# Patient Record
Sex: Female | Born: 1940 | Race: Black or African American | Hispanic: No | Marital: Married | Smoking: Never smoker
Health system: Southern US, Community
[De-identification: ages and names within clinical notes are randomized; demographics above are authoritative.]

## PROBLEM LIST (undated history)

## (undated) DIAGNOSIS — M549 Dorsalgia, unspecified: Secondary | ICD-10-CM

## (undated) DIAGNOSIS — M722 Plantar fascial fibromatosis: Secondary | ICD-10-CM

## (undated) DIAGNOSIS — I10 Essential (primary) hypertension: Secondary | ICD-10-CM

## (undated) DIAGNOSIS — H35039 Hypertensive retinopathy, unspecified eye: Secondary | ICD-10-CM

## (undated) DIAGNOSIS — I739 Peripheral vascular disease, unspecified: Secondary | ICD-10-CM

## (undated) DIAGNOSIS — I499 Cardiac arrhythmia, unspecified: Secondary | ICD-10-CM

## (undated) DIAGNOSIS — E119 Type 2 diabetes mellitus without complications: Secondary | ICD-10-CM

## (undated) DIAGNOSIS — Z86010 Personal history of colonic polyps: Secondary | ICD-10-CM

## (undated) DIAGNOSIS — H269 Unspecified cataract: Secondary | ICD-10-CM

## (undated) DIAGNOSIS — M1990 Unspecified osteoarthritis, unspecified site: Secondary | ICD-10-CM

## (undated) DIAGNOSIS — L259 Unspecified contact dermatitis, unspecified cause: Secondary | ICD-10-CM

## (undated) DIAGNOSIS — R0602 Shortness of breath: Secondary | ICD-10-CM

## (undated) DIAGNOSIS — Z8709 Personal history of other diseases of the respiratory system: Secondary | ICD-10-CM

## (undated) DIAGNOSIS — Z8679 Personal history of other diseases of the circulatory system: Secondary | ICD-10-CM

## (undated) DIAGNOSIS — G8929 Other chronic pain: Secondary | ICD-10-CM

## (undated) DIAGNOSIS — H409 Unspecified glaucoma: Secondary | ICD-10-CM

## (undated) HISTORY — DX: Unspecified contact dermatitis, unspecified cause: L25.9

## (undated) HISTORY — PX: FLEXIBLE SIGMOIDOSCOPY: SHX1649

## (undated) HISTORY — PX: ABDOMINAL HYSTERECTOMY: SHX81

## (undated) HISTORY — DX: Other chronic pain: G89.29

## (undated) HISTORY — PX: OTHER SURGICAL HISTORY: SHX169

## (undated) HISTORY — DX: Personal history of other diseases of the respiratory system: Z87.09

## (undated) HISTORY — DX: Personal history of other diseases of the circulatory system: Z86.79

## (undated) HISTORY — DX: Personal history of colonic polyps: Z86.010

## (undated) HISTORY — DX: Dorsalgia, unspecified: M54.9

## (undated) HISTORY — PX: FOOT SURGERY: SHX648

## (undated) HISTORY — PX: CATARACT EXTRACTION: SUR2

## (undated) HISTORY — DX: Unspecified cataract: H26.9

## (undated) HISTORY — DX: Unspecified glaucoma: H40.9

## (undated) HISTORY — DX: Plantar fascial fibromatosis: M72.2

## (undated) HISTORY — DX: Type 2 diabetes mellitus without complications: E11.9

## (undated) HISTORY — DX: Essential (primary) hypertension: I10

## (undated) HISTORY — DX: Hypertensive retinopathy, unspecified eye: H35.039

---

## 1999-05-27 ENCOUNTER — Encounter: Payer: Self-pay | Admitting: Internal Medicine

## 1999-05-27 ENCOUNTER — Ambulatory Visit (HOSPITAL_COMMUNITY): Admission: RE | Admit: 1999-05-27 | Discharge: 1999-05-27 | Payer: Self-pay | Admitting: Internal Medicine

## 1999-11-16 ENCOUNTER — Emergency Department (HOSPITAL_COMMUNITY): Admission: EM | Admit: 1999-11-16 | Discharge: 1999-11-16 | Payer: Self-pay | Admitting: Emergency Medicine

## 1999-11-17 ENCOUNTER — Encounter: Payer: Self-pay | Admitting: Emergency Medicine

## 2001-03-11 ENCOUNTER — Other Ambulatory Visit: Admission: RE | Admit: 2001-03-11 | Discharge: 2001-03-11 | Payer: Self-pay | Admitting: Obstetrics & Gynecology

## 2003-03-21 ENCOUNTER — Other Ambulatory Visit: Admission: RE | Admit: 2003-03-21 | Discharge: 2003-03-21 | Payer: Self-pay | Admitting: Obstetrics & Gynecology

## 2004-07-04 ENCOUNTER — Encounter: Admission: RE | Admit: 2004-07-04 | Discharge: 2004-07-18 | Payer: Self-pay | Admitting: Internal Medicine

## 2005-02-03 ENCOUNTER — Ambulatory Visit: Payer: Self-pay | Admitting: Internal Medicine

## 2005-03-18 ENCOUNTER — Ambulatory Visit: Payer: Self-pay | Admitting: Endocrinology

## 2005-03-18 ENCOUNTER — Other Ambulatory Visit: Admission: RE | Admit: 2005-03-18 | Discharge: 2005-03-18 | Payer: Self-pay | Admitting: Obstetrics & Gynecology

## 2005-03-28 ENCOUNTER — Ambulatory Visit: Payer: Self-pay | Admitting: Internal Medicine

## 2005-04-04 ENCOUNTER — Ambulatory Visit: Payer: Self-pay | Admitting: Internal Medicine

## 2006-06-12 ENCOUNTER — Ambulatory Visit: Payer: Self-pay | Admitting: Internal Medicine

## 2006-09-11 ENCOUNTER — Ambulatory Visit: Payer: Self-pay | Admitting: Internal Medicine

## 2007-01-18 ENCOUNTER — Ambulatory Visit: Payer: Self-pay | Admitting: Internal Medicine

## 2007-02-02 ENCOUNTER — Ambulatory Visit: Payer: Self-pay | Admitting: Internal Medicine

## 2007-05-12 LAB — CONVERTED CEMR LAB: Pap Smear: NORMAL

## 2007-06-23 ENCOUNTER — Ambulatory Visit: Payer: Self-pay | Admitting: Internal Medicine

## 2007-06-23 LAB — CONVERTED CEMR LAB
BUN: 17 mg/dL (ref 6–23)
CO2: 27 meq/L (ref 19–32)
Calcium: 9.5 mg/dL (ref 8.4–10.5)
Chloride: 104 meq/L (ref 96–112)
Creatinine, Ser: 0.9 mg/dL (ref 0.4–1.2)
Folate: 15.4 ng/mL
GFR calc Af Amer: 81 mL/min
GFR calc non Af Amer: 67 mL/min
Glucose, Bld: 102 mg/dL — ABNORMAL HIGH (ref 70–99)
Hgb A1c MFr Bld: 6.2 % — ABNORMAL HIGH (ref 4.6–6.0)
Potassium: 4.2 meq/L (ref 3.5–5.1)
Sodium: 141 meq/L (ref 135–145)
TSH: 1.18 microintl units/mL (ref 0.35–5.50)
Vitamin B-12: 876 pg/mL (ref 211–911)

## 2007-07-20 ENCOUNTER — Ambulatory Visit: Payer: Self-pay | Admitting: Endocrinology

## 2007-07-29 ENCOUNTER — Ambulatory Visit: Payer: Self-pay

## 2007-08-20 ENCOUNTER — Encounter: Payer: Self-pay | Admitting: Internal Medicine

## 2007-08-20 ENCOUNTER — Ambulatory Visit: Payer: Self-pay | Admitting: Internal Medicine

## 2007-08-20 DIAGNOSIS — J309 Allergic rhinitis, unspecified: Secondary | ICD-10-CM | POA: Insufficient documentation

## 2007-08-20 DIAGNOSIS — I10 Essential (primary) hypertension: Secondary | ICD-10-CM | POA: Insufficient documentation

## 2007-10-08 ENCOUNTER — Encounter: Payer: Self-pay | Admitting: Internal Medicine

## 2007-10-08 ENCOUNTER — Telehealth: Payer: Self-pay | Admitting: Internal Medicine

## 2008-03-22 ENCOUNTER — Encounter: Payer: Self-pay | Admitting: *Deleted

## 2008-03-22 DIAGNOSIS — M722 Plantar fascial fibromatosis: Secondary | ICD-10-CM | POA: Insufficient documentation

## 2008-03-22 DIAGNOSIS — J329 Chronic sinusitis, unspecified: Secondary | ICD-10-CM | POA: Insufficient documentation

## 2008-03-22 DIAGNOSIS — M549 Dorsalgia, unspecified: Secondary | ICD-10-CM | POA: Insufficient documentation

## 2008-03-22 DIAGNOSIS — Z8679 Personal history of other diseases of the circulatory system: Secondary | ICD-10-CM | POA: Insufficient documentation

## 2008-03-22 DIAGNOSIS — L259 Unspecified contact dermatitis, unspecified cause: Secondary | ICD-10-CM | POA: Insufficient documentation

## 2008-03-23 ENCOUNTER — Ambulatory Visit: Payer: Self-pay | Admitting: Internal Medicine

## 2008-03-23 DIAGNOSIS — J069 Acute upper respiratory infection, unspecified: Secondary | ICD-10-CM | POA: Insufficient documentation

## 2008-04-25 ENCOUNTER — Ambulatory Visit: Payer: Self-pay | Admitting: Internal Medicine

## 2008-05-02 ENCOUNTER — Telehealth: Payer: Self-pay | Admitting: Internal Medicine

## 2008-05-10 ENCOUNTER — Telehealth: Payer: Self-pay | Admitting: Internal Medicine

## 2008-05-17 ENCOUNTER — Ambulatory Visit: Payer: Self-pay | Admitting: Internal Medicine

## 2008-08-11 ENCOUNTER — Encounter: Payer: Self-pay | Admitting: Internal Medicine

## 2008-09-01 ENCOUNTER — Ambulatory Visit: Payer: Self-pay | Admitting: Internal Medicine

## 2008-09-01 DIAGNOSIS — K5732 Diverticulitis of large intestine without perforation or abscess without bleeding: Secondary | ICD-10-CM | POA: Insufficient documentation

## 2008-12-06 ENCOUNTER — Telehealth: Payer: Self-pay | Admitting: Internal Medicine

## 2008-12-26 ENCOUNTER — Ambulatory Visit: Payer: Self-pay | Admitting: Internal Medicine

## 2008-12-28 ENCOUNTER — Ambulatory Visit: Payer: Self-pay | Admitting: Internal Medicine

## 2008-12-28 LAB — CONVERTED CEMR LAB
BUN: 24 mg/dL — ABNORMAL HIGH (ref 6–23)
CO2: 28 meq/L (ref 19–32)
Calcium: 9.3 mg/dL (ref 8.4–10.5)
Chloride: 103 meq/L (ref 96–112)
Creatinine, Ser: 1.1 mg/dL (ref 0.4–1.2)
GFR calc Af Amer: 64 mL/min
GFR calc non Af Amer: 53 mL/min
Glucose, Bld: 124 mg/dL — ABNORMAL HIGH (ref 70–99)
Potassium: 3.9 meq/L (ref 3.5–5.1)
Sodium: 140 meq/L (ref 135–145)

## 2009-01-04 ENCOUNTER — Telehealth: Payer: Self-pay | Admitting: Internal Medicine

## 2009-01-09 ENCOUNTER — Encounter: Admission: RE | Admit: 2009-01-09 | Discharge: 2009-01-09 | Payer: Self-pay | Admitting: Internal Medicine

## 2009-01-25 ENCOUNTER — Telehealth: Payer: Self-pay | Admitting: Internal Medicine

## 2009-03-05 ENCOUNTER — Ambulatory Visit: Payer: Self-pay | Admitting: Internal Medicine

## 2009-04-06 ENCOUNTER — Telehealth: Payer: Self-pay | Admitting: Internal Medicine

## 2009-05-21 ENCOUNTER — Telehealth: Payer: Self-pay | Admitting: Internal Medicine

## 2009-12-10 ENCOUNTER — Telehealth: Payer: Self-pay | Admitting: Internal Medicine

## 2010-06-24 ENCOUNTER — Telehealth: Payer: Self-pay | Admitting: Internal Medicine

## 2010-12-09 ENCOUNTER — Telehealth: Payer: Self-pay | Admitting: Internal Medicine

## 2010-12-31 NOTE — Progress Notes (Signed)
Summary: Multiple med refill request  Phone Note Call from Patient   Summary of Call: Patient is requesting a call, wants a med called in.  Initial call taken by: Lamar Sprinkles, CMA,  December 10, 2009 9:44 AM  Follow-up for Phone Call        Spoke with patient and she needs refills on:  Tiazac, Zolpidem, Metoprolol, Bumetanide, and Estropipate. Follow-up by: Lucious Groves,  December 10, 2009 10:49 AM  Additional Follow-up for Phone Call Additional follow up Details #1::        OK for 6 month refills on all meds. Additional Follow-up by: Jacques Navy MD,  December 10, 2009 2:31 PM    Additional Follow-up for Phone Call Additional follow up Details #2::    done. Patient was notified and she prefers the prescription's go to [facility] not [facility] will change prescriptions accordingly/print zolpidem. Follow-up by: Lucious Groves,  December 10, 2009 4:18 PM  Prescriptions: ZOLPIDEM TARTRATE 10 MG  TABS (ZOLPIDEM TARTRATE) 1 by mouth at bedtime  #90 x 2   Entered by:   Lucious Groves   Authorized by:   Jacques Navy MD   Signed by:   Lucious Groves on 12/10/2009   Method used:   Printed then faxed to ...       [facility]* (mail-order)             ,          Ph: [phone]       Fax: [phone]   RxID:   [id number] METOPROLOL SUCCINATE 50 MG  TB24 (METOPROLOL SUCCINATE) Take 1 tablet by mouth once a day  #90 x 2   Entered by:   Lucious Groves   Authorized by:   Jacques Navy MD   Signed by:   Lucious Groves on 12/10/2009   Method used:   Electronically to        [facility]* (mail-order)             ,          Ph: [phone]       Fax: [phone]   RxID:   [id number] BUMETANIDE 1 MG  TABS (BUMETANIDE) once daily  #90 x 2   Entered by:   Lucious Groves   Authorized by:   Jacques Navy MD   Signed by:   Lucious Groves on 12/10/2009   Method used:   Electronically to        [facility]* (mail-order)             ,          Ph: [phone]       Fax: [phone]  RxID:   [id number] ESTROPIPATE 1.5 MG  TABS (ESTROPIPATE) once daily  #90 x 2   Entered by:   Lucious Groves   Authorized by:   Jacques Navy MD   Signed by:   Lucious Groves on 12/10/2009   Method used:   Electronically to        [facility]* (mail-order)             ,          Ph: [phone]       Fax: [phone]   RxID:   [id number] TIAZAC 300 MG  CP24 (DILTIAZEM HCL ER BEADS) once daily  #90 Capsule x 2   Entered by:   Lucious Groves   Authorized by:   Jacques Navy MD  Signed by:   Lucious Groves on 12/10/2009   Method used:   Electronically to        [facility] [employer] [facility]* (mail-order)             ,          Ph: [phone]       Fax: [phone]   RxID:   [id number] ZOLPIDEM TARTRATE 10 MG  TABS (ZOLPIDEM TARTRATE) 1 by mouth at bedtime  #90 x 2   Entered by:   Lucious Groves   Authorized by:   Jacques Navy MD   Signed by:   Lucious Groves on 12/10/2009   Method used:   [facility] to ...       [facility] [address] [phone]* (retail)       [address]       [place], [place]  [address]       Ph: [phone] or [phone]       Fax: [phone]   RxID:   [id number] METOPROLOL SUCCINATE 50 MG  TB24 (METOPROLOL SUCCINATE) Take 1 tablet by mouth once a day  #90 x 2   Entered by:   Lucious Groves   Authorized by:   Jacques Navy MD   Signed by:   Lucious Groves on 12/10/2009   Method used:   [facility]        [facility] [address] [phone]* (retail)       [address]       [place], [place]  [address]       Ph: [phone] or [phone]       Fax: [phone]   RxID:   [id number] BUMETANIDE 1 MG  TABS (BUMETANIDE) once daily  #90 x 2   Entered by:   Lucious Groves   Authorized by:   Jacques Navy MD   Signed by:   Lucious Groves on 12/10/2009   Method used:   [facility]ronically to        [facility] [address] [phone]* (retail)       [address]       [place], [place]   [address]       Ph: [phone] or [phone]       Fax: [phone]   RxID:   [id number] ESTROPIPATE 1.5 MG  TABS (ESTROPIPATE) once daily  #90 x 2   Entered by:   Lucious Groves   Authorized by:   Jacques Navy MD   Signed by:   Lucious Groves on 12/10/2009   Method used:   Electronically to        [facility] [address] [phone]* (retail)       [address]       [place], [place]  [address]       Ph: [phone] or [phone]       Fax: [phone]   RxID:   [id number] TIAZAC 300 MG  CP24 (DILTIAZEM HCL ER BEADS) once daily  #90 Capsule x 2   Entered by:   Lucious Groves   Authorized by:   Jacques Navy MD   Signed by:   Lucious Groves on 12/10/2009   Method used:   Electronically to        [facility] [address] [phone]* (retail)  [address]       [place], [place]  [address]       Ph: [phone] or [phone]       Fax: [phone]   RxID:   [id number]   Appended Document: Multiple med refill request Faxed Zolpidem prescription to [facility].

## 2010-12-31 NOTE — Progress Notes (Signed)
Summary: [facility]  Phone Note Call from Patient Call back at [facility]  Phone [phone]   Summary of Call: Patient left message on triage that she would like her prescriptions, including Zolpidem, sent to [facility]. I am aware the pain med cannot be sent, but is it ok to send the rest?  Please advise. Initial call taken by: Lucious Groves CMA,  June 24, 2010 10:00 AM  Follow-up for Phone Call        OK for refills. Can print the hydrocodone Rx and fax it to [facility] Follow-up by: Jacques Navy MD,  June 24, 2010 10:46 AM    Prescriptions: HYDROCODONE-ACETAMINOPHEN 7.5-750 MG TABS (HYDROCODONE-ACETAMINOPHEN) 1 by mouth three times a day as needed  #90 x 3   Entered by:   Lucious Groves CMA   Authorized by:   Jacques Navy MD   Signed by:   Lucious Groves CMA on 06/24/2010   Method used:   Printed then faxed to ...       [facility] (mail-order)             , [place]         Ph: [phone]       Fax: [phone]   RxID:   [id number] ZOLPIDEM TARTRATE 10 MG  TABS (ZOLPIDEM TARTRATE) 1 by mouth at bedtime  #90 x 3   Entered by:   Lucious Groves CMA   Authorized by:   Jacques Navy MD   Signed by:   Lucious Groves CMA on 06/24/2010   Method used:   Printed then faxed to ...       [facility] (mail-order)             , [place]         Ph: [phone]       Fax: [phone]   RxID:   [id number] DIOVAN 160 MG TABS (VALSARTAN) 1 by mouth once daily  #90 x 3   Entered by:   Lucious Groves CMA   Authorized by:   Jacques Navy MD   Signed by:   Lucious Groves CMA on 06/24/2010   Method used:   Faxed to ...       [facility] (mail-order)             , [place]         Ph: [phone]       Fax: [phone]   RxID:   [id number] METOPROLOL SUCCINATE 50 MG  TB24 (METOPROLOL SUCCINATE) Take 1 tablet by mouth once a day  #90 x 3   Entered by:   Lucious Groves CMA   Authorized by:   Jacques Navy MD   Signed by:   Lucious Groves CMA on 06/24/2010   Method used:   Faxed to ...       [facility] (mail-order)          , [place]         Ph: [phone]       Fax: [phone]   RxID:   [id number] BUMETANIDE 1 MG  TABS (BUMETANIDE) once daily  #90 x 3   Entered by:   Lucious Groves CMA   Authorized by:   Jacques Navy MD   Signed by:   Lucious Groves CMA on 06/24/2010   Method used:   Faxed to ...       [facility] (mail-order)             , [place]  Ph: [phone]       Fax: [phone]   RxID:   [id number] ESTROPIPATE 1.5 MG  TABS (ESTROPIPATE) once daily  #90 x 3   Entered by:   Lucious Groves CMA   Authorized by:   Jacques Navy MD   Signed by:   Lucious Groves CMA on 06/24/2010   Method used:   Faxed to ...       [facility] (mail-order)             , [place]         Ph: [phone]       Fax: [phone]   RxID:   [id number] TIAZAC 300 MG  CP24 (DILTIAZEM HCL ER BEADS) once daily  #90 Capsule x 3   Entered by:   Lucious Groves CMA   Authorized by:   Jacques Navy MD   Signed by:   Lucious Groves CMA on 06/24/2010   Method used:   Faxed to ...       [facility] (mail-order)             , [place]         Ph: [phone]       Fax: [phone]   RxID:   [id number]

## 2011-01-02 NOTE — Progress Notes (Signed)
Summary: [facility]- [facility]  Phone Note Refill Request Call back at Home Phone [phone]   Refills Requested: Medication #1:  TIAZAC 300 MG  CP24 once daily  Medication #2:  BUMETANIDE 1 MG  TABS once daily  Medication #3:  ZOLPIDEM TARTRATE 10 MG  TABS 1 by mouth at bedtime Initial call taken by: Lamar Sprinkles, CMA,  December 10, 2010 10:01 AM Caller: Patient--[phone] Call For: Jacques Navy MD Summary of Call: Pt left message on triage A, pt wants refills to go to [facility]. Pt wants a call as well. Initial call taken by: Verdell Face,  December 09, 2010 9:48 AM  Follow-up for Phone Call        OK FOR RFS TO GO TO [facility]?  Follow-up by: Lamar Sprinkles, CMA,  December 10, 2010 10:01 AM  Additional Follow-up for Phone Call Additional follow up Details #1::        ok for meds to go to [facility] for 90 day supply Additional Follow-up by: Jacques Navy MD,  December 10, 2010 5:58 PM    Additional Follow-up for Phone Call Additional follow up Details #2::    Rx ready - to be faxed from office Follow-up by: Lamar Sprinkles, CMA,  December 11, 2010 5:36 PM  Prescriptions: ZOLPIDEM TARTRATE 10 MG  TABS (ZOLPIDEM TARTRATE) 1 by mouth at bedtime  #90 x 1   Entered by:   Lamar Sprinkles, CMA   Authorized by:   Jacques Navy MD   Signed by:   Lamar Sprinkles, CMA on 12/11/2010   Method used:   Printed then faxed to ...       [facility]* (retail)             ,          Ph: [phone]       Fax: [phone]   RxID:   [id number] BUMETANIDE 1 MG  TABS (BUMETANIDE) once daily  #90 x 3   Entered by:   Lamar Sprinkles, CMA   Authorized by:   Jacques Navy MD   Signed by:   Lamar Sprinkles, CMA on 12/11/2010   Method used:   Electronically to        [facility]* (retail)             ,          Ph: [phone]       Fax: [phone]   RxID:   [id number] TIAZAC 300 MG  CP24 (DILTIAZEM HCL ER BEADS) once daily  #90 Capsule x 3   Entered by:   Lamar Sprinkles, CMA   Authorized by:    Jacques Navy MD   Signed by:   Lamar Sprinkles, CMA on 12/11/2010   Method used:   Electronically to        [facility]* (retail)             ,          Ph: [phone]       Fax: [phone]   RxID:   [id number]

## 2011-04-18 NOTE — Assessment & Plan Note (Signed)
[facility]                           PRIMARY CARE OFFICE NOTE   Sarah [name], Sarah [name]                        MRN:          [id number]  DATE:02/02/2007                            DOB:          [birth year]    HISTORY OF PRESENT ILLNESS:  Sarah [name] presents for follow up of her  blood pressure.  She was last seen in the office on January 18, 2007  with a blood pressure of 159/87.  Lasix 20 mg q.a.m. was added to her  regimen of Tiazac 300 mg daily and Toprol XL 50 mg daily.  On this  regimen, the patient's blood pressure today was 149/72.  She reports she  is actually feeling better.  However, she reports she is having some  increasing pruritus which she thinks is due to the furosemide.   The patient also has ongoing problems with chronic back pain and has  been out of Celebrex.   PLAN:  The patient is switched from Lasix to Bumex (bumetanide) 1 mg  daily, prescription provided.   The patient was given a prescription for Celebrex 200 mg daily for her  back pain.     Rosalyn Gess Norins, MD  Electronically Signed    [facility]  DD: 02/02/2007  DT: 02/02/2007  Job #: [id number]

## 2011-08-27 ENCOUNTER — Other Ambulatory Visit: Payer: Self-pay | Admitting: Internal Medicine

## 2011-09-04 ENCOUNTER — Encounter: Payer: Self-pay | Admitting: Internal Medicine

## 2011-09-08 ENCOUNTER — Ambulatory Visit (INDEPENDENT_AMBULATORY_CARE_PROVIDER_SITE_OTHER): Payer: Medicare Other | Admitting: Internal Medicine

## 2011-09-08 VITALS — BP 180/60 | HR 92 | Temp 98.5°F | Wt 160.0 lb

## 2011-09-08 DIAGNOSIS — L259 Unspecified contact dermatitis, unspecified cause: Secondary | ICD-10-CM

## 2011-09-08 DIAGNOSIS — I10 Essential (primary) hypertension: Secondary | ICD-10-CM

## 2011-09-08 DIAGNOSIS — M549 Dorsalgia, unspecified: Secondary | ICD-10-CM

## 2011-09-08 DIAGNOSIS — J309 Allergic rhinitis, unspecified: Secondary | ICD-10-CM

## 2011-09-08 DIAGNOSIS — Z0000 Encounter for general adult medical examination without abnormal findings: Secondary | ICD-10-CM

## 2011-09-08 DIAGNOSIS — Z23 Encounter for immunization: Secondary | ICD-10-CM

## 2011-09-08 MED ORDER — METOPROLOL TARTRATE 50 MG PO TABS: 50.0000 mg | ORAL_TABLET | Freq: Every day | ORAL | Status: DC

## 2011-09-08 MED ORDER — HYDROCODONE-ACETAMINOPHEN 7.5-750 MG PO TABS: 1.0000 | ORAL_TABLET | Freq: Four times a day (QID) | ORAL | Status: DC | PRN

## 2011-09-08 MED ORDER — ZOLPIDEM TARTRATE 10 MG PO TABS: 10.0000 mg | ORAL_TABLET | Freq: Every evening | ORAL | Status: DC | PRN

## 2011-09-08 NOTE — Patient Instructions (Signed)
Allergic rhinits - drainage without signs of infection. Plan - try generic claritin 10mg  once a day. Safe with BP  Rash on  Breast does not look like shingles. It may be an insect bite or local reaction. Watch for blistering or spreading - if this happens call. Try over the counter cortisone cream to the rash twice a day.  Blood pressure - 180/60 too high. Plan - please check a few out of the office readings and report back. If the top number is running greater than 140 on a regular basis will need to make increases in the medications you already take.   Health maintenance - up to date with Dr. Jennette Kettle, up to date with mammography.

## 2011-09-08 NOTE — Progress Notes (Signed)
  Subjective:    Patient ID: Sarah [name], female    DOB: 23-Oct-1941, 70 y.o.   MRN: [id number]  HPI Ms. Conaty presents for post-nasal drainage which is a chronic problem. She has had no fever, chills, purulent drainage or sinus pain.  She c/o itchy red rash on the right breast that has been present for several days. There is no spread, no pain or paresthesia.  She needs refills on several medications.  Her BP has been elevated.  She is current with Dr. Jennette Kettle for Gyn care. She was Vitamin D deficient and had replacement therapy with follow up Vit D level of 62. She is current with mammography   Past Medical History  Diagnosis Date  . Plantar fascial fibromatosis   . Personal history of unspecified circulatory disease   . Contact dermatitis and other eczema, due to unspecified cause   . History of sinusitis   . Hypertension   . Chronic back pain   . Hypertension   . Allergic rhinitis    Past Surgical History  Procedure Date  . History of lumbar fusion     '87   No family history on file. History   Social History  . Marital Status: Married    Spouse Name: N/A    Number of Children: N/A  . Years of Education: N/A   Occupational History  . Not on file.   Social History Main Topics  . Smoking status: Never Smoker   . Smokeless tobacco: Never Used  . Alcohol Use: No  . Drug Use: No  . Sexually Active: Not on file   Other Topics Concern  . Not on file   Social History Narrative   HSG. Married- '63. 1 daughter '64, 1 son '70,  2 grandchildren. Work: retired. Lives with husband and brother is in the home.        Review of Systems System review is negative for any constitutional, cardiac, pulmonary, GI or neuro symptoms or complaints other than as described in the HPI.     Objective:   Physical Exam WNWD AA woman in no distress HEENT- no facial tenderness, C&S clear, minimal proptosis Neck - supple Cor - 2+ radial and DP pulse, RRR w/o murmur, rub, gallop, no  peripheral edema Chest - clear Derm - 3 erythematous punctate lesion right breast with no vesicle no underlying erythema  Lab Results  Component Value Date   WBC 4.4* 09/09/2011   HGB 13.1 09/09/2011   HCT 40.0 09/09/2011   PLT 230.0 09/09/2011   GLUCOSE 105* 09/09/2011   CHOL 161 09/09/2011   TRIG 57.0 09/09/2011   HDL 56.40 09/09/2011   LDLCALC 93 09/09/2011   ALT 28 09/09/2011   AST 23 09/09/2011   NA 142 09/09/2011   K 4.2 09/09/2011   CL 106 09/09/2011   CREATININE 1.2 09/09/2011   BUN 24* 09/09/2011   CO2 27 09/09/2011   TSH 1.77 09/09/2011   HGBA1C 6.5 09/09/2011          Assessment & Plan:

## 2011-09-09 ENCOUNTER — Other Ambulatory Visit (INDEPENDENT_AMBULATORY_CARE_PROVIDER_SITE_OTHER): Payer: Medicare Other

## 2011-09-09 ENCOUNTER — Other Ambulatory Visit: Payer: Self-pay | Admitting: *Deleted

## 2011-09-09 DIAGNOSIS — I10 Essential (primary) hypertension: Secondary | ICD-10-CM

## 2011-09-09 DIAGNOSIS — Z0000 Encounter for general adult medical examination without abnormal findings: Secondary | ICD-10-CM

## 2011-09-09 DIAGNOSIS — Z79899 Other long term (current) drug therapy: Secondary | ICD-10-CM

## 2011-09-09 LAB — CBC WITH DIFFERENTIAL/PLATELET
Basophils Relative: 0.3 % (ref 0.0–3.0)
Eosinophils Relative: 3 % (ref 0.0–5.0)
HCT: 40 % (ref 36.0–46.0)
Hemoglobin: 13.1 g/dL (ref 12.0–15.0)
Lymphocytes Relative: 34.5 % (ref 12.0–46.0)
Lymphs Abs: 1.5 10*3/uL (ref 0.7–4.0)
Monocytes Relative: 12.5 % — ABNORMAL HIGH (ref 3.0–12.0)
Neutro Abs: 2.2 10*3/uL (ref 1.4–7.7)
RBC: 4.41 Mil/uL (ref 3.87–5.11)

## 2011-09-09 LAB — COMPREHENSIVE METABOLIC PANEL
ALT: 28 U/L (ref 0–35)
AST: 23 U/L (ref 0–37)
Alkaline Phosphatase: 87 U/L (ref 39–117)
Glucose, Bld: 105 mg/dL — ABNORMAL HIGH (ref 70–99)
Sodium: 142 mEq/L (ref 135–145)
Total Bilirubin: 0.7 mg/dL (ref 0.3–1.2)
Total Protein: 8 g/dL (ref 6.0–8.3)

## 2011-09-09 LAB — LIPID PANEL
Cholesterol: 161 mg/dL (ref 0–200)
HDL: 56.4 mg/dL
LDL Cholesterol: 93 mg/dL (ref 0–99)
Total CHOL/HDL Ratio: 3
Triglycerides: 57 mg/dL (ref 0.0–149.0)
VLDL: 11.4 mg/dL (ref 0.0–40.0)

## 2011-09-09 LAB — TSH: TSH: 1.77 u[IU]/mL (ref 0.35–5.50)

## 2011-09-09 MED ORDER — VALSARTAN 160 MG PO TABS: 160.0000 mg | ORAL_TABLET | Freq: Every day | ORAL | Status: DC

## 2011-09-10 ENCOUNTER — Encounter: Payer: Self-pay | Admitting: Internal Medicine

## 2011-09-10 DIAGNOSIS — Z0000 Encounter for general adult medical examination without abnormal findings: Secondary | ICD-10-CM | POA: Insufficient documentation

## 2011-09-10 NOTE — Assessment & Plan Note (Addendum)
Interval history w/o serious illness, w/o surgery or injury. She is current with gyn, breast cancer screening w/ mammography. She appears to be due for colonoscopy for routine cancer screening. Immunizations as noted. Lab results are in normal limits including A1C, lipids and general chemistry.  In summary - a nice woman who hasn't been seen for 2 years but who is medically stable except for elevated BP with plans as above. She is encouraged to schedule colonoscopy. She will return in 6 months or prn.

## 2011-09-10 NOTE — Assessment & Plan Note (Signed)
Chronic problem that is stable w/o exacerbation. Since she has retired she has had less discomfort.

## 2011-09-10 NOTE — Assessment & Plan Note (Signed)
There is a minimal rash on the right breast. This does not appear to be HSV or zoster.  Plan - topical low dose cortisone.

## 2011-09-10 NOTE — Assessment & Plan Note (Signed)
BP Readings from Last 3 Encounters:  09/08/11 180/60  03/05/09 158/82  12/26/08 150/82   Poor control with an upward trend. She has no symptoms or complaints.  Plan - home monitoring and report back. If SBP running greater than 140 will adjust her current medications - there is room to increase doses of several of her medications.

## 2011-09-10 NOTE — Assessment & Plan Note (Signed)
No evidence of infection. Suspect her symptoms are all allergy related. No indication for antibiotics.  Plan - otc non-sedating antihistamine.

## 2011-09-21 ENCOUNTER — Encounter: Payer: Self-pay | Admitting: Internal Medicine

## 2011-10-01 ENCOUNTER — Telehealth: Payer: Self-pay | Admitting: *Deleted

## 2011-10-01 NOTE — Telephone Encounter (Signed)
Pt left Vm w/BP readings - 180/60 and 122/64

## 2011-10-03 MED ORDER — FUROSEMIDE 20 MG PO TABS: 20.0000 mg | ORAL_TABLET | Freq: Every day | ORAL | Status: DC

## 2011-10-03 NOTE — Telephone Encounter (Signed)
Need to add low dose diuretic - furosemide 20 mg qd. Rx sent to [facility]

## 2011-10-03 NOTE — Telephone Encounter (Signed)
Informed pt .

## 2011-12-29 ENCOUNTER — Other Ambulatory Visit: Payer: Self-pay

## 2011-12-29 MED ORDER — METOPROLOL TARTRATE 50 MG PO TABS: 50.0000 mg | ORAL_TABLET | Freq: Every day | ORAL | Status: DC

## 2011-12-29 MED ORDER — FUROSEMIDE 20 MG PO TABS: 20.0000 mg | ORAL_TABLET | Freq: Every day | ORAL | Status: DC

## 2011-12-29 MED ORDER — BUMETANIDE 1 MG PO TABS: 1.0000 mg | ORAL_TABLET | Freq: Every day | ORAL | Status: DC

## 2011-12-29 MED ORDER — ESTROPIPATE 1.5 MG PO TABS: 1.5000 mg | ORAL_TABLET | Freq: Every day | ORAL | Status: DC

## 2011-12-29 MED ORDER — DILTIAZEM HCL ER BEADS 300 MG PO CP24: 300.0000 mg | ORAL_CAPSULE | Freq: Every day | ORAL | Status: DC

## 2011-12-29 MED ORDER — VALSARTAN 160 MG PO TABS: 160.0000 mg | ORAL_TABLET | Freq: Every day | ORAL | Status: DC

## 2012-01-12 ENCOUNTER — Telehealth: Payer: Self-pay

## 2012-01-12 NOTE — Telephone Encounter (Signed)
Received faxed refill request from [facility] requesting zolpidem 10 mg. Please advise if ok to refill Thanks

## 2012-01-12 NOTE — Telephone Encounter (Signed)
Ok for [employer] refill x 5

## 2012-01-13 MED ORDER — ZOLPIDEM TARTRATE 10 MG PO TABS: 10.0000 mg | ORAL_TABLET | Freq: Every evening | ORAL | Status: DC | PRN

## 2012-02-16 ENCOUNTER — Encounter: Payer: Self-pay | Admitting: Internal Medicine

## 2012-02-16 ENCOUNTER — Ambulatory Visit (INDEPENDENT_AMBULATORY_CARE_PROVIDER_SITE_OTHER): Payer: Medicare Other | Admitting: Internal Medicine

## 2012-02-16 VITALS — BP 124/58 | HR 85 | Temp 98.3°F | Resp 16 | Wt 149.0 lb

## 2012-02-16 DIAGNOSIS — I10 Essential (primary) hypertension: Secondary | ICD-10-CM

## 2012-02-16 MED ORDER — LOSARTAN POTASSIUM 100 MG PO TABS: 100.0000 mg | ORAL_TABLET | Freq: Every day | ORAL | Status: DC

## 2012-02-16 NOTE — Progress Notes (Signed)
  Subjective:    Patient ID: Sarah [name], female    DOB: July 03, 1941, 71 y.o.   MRN: [id number]  HPI Sarah [name] presents for BP follow -up. Diovan is too expensive and she would like to switch to a more cost effective medication. She is feeling OK, sad due to death of a niece and two sisters - in - Social worker.  PMH, FamHx and SocHx reviewed for any changes and relevance.   Review of Systems System review is negative for any constitutional, cardiac, pulmonary, GI or neuro symptoms or complaints other than as described in the HPI.     Objective:   Physical Exam Filed Vitals:   02/16/12 1042  BP: 124/58  Pulse: 85  Temp: 98.3 F (36.8 C)  Resp: 16   Gen'l - WNWD AA woman in no distress HEENT - C&S clear Cor- 2+ radial, RRR Pulm - normal respirations. Neuro - A&O x 3       Assessment & Plan:

## 2012-02-16 NOTE — Patient Instructions (Signed)
OK to take aspirin and tylenol along with the BP medications. The only adverse affect is that there may be a slight lowering of effect of the Blood pressure medication. You need to come back for lab at your convenience for routine blood test related to the high blood pressure.  I am sorry for your loss.

## 2012-02-16 NOTE — Assessment & Plan Note (Signed)
BP Readings from Last 3 Encounters:  02/16/12 124/58  09/08/11 180/60  03/05/09 158/82   Good control on 4 agents.   Plan Will change diovan to losartan.         Follow-up Bmet.

## 2012-02-17 ENCOUNTER — Other Ambulatory Visit (INDEPENDENT_AMBULATORY_CARE_PROVIDER_SITE_OTHER): Payer: Medicare Other

## 2012-02-17 DIAGNOSIS — I10 Essential (primary) hypertension: Secondary | ICD-10-CM

## 2012-02-17 LAB — COMPREHENSIVE METABOLIC PANEL
ALT: 17 U/L (ref 0–35)
AST: 20 U/L (ref 0–37)
Alkaline Phosphatase: 80 U/L (ref 39–117)
BUN: 30 mg/dL — ABNORMAL HIGH (ref 6–23)
Chloride: 104 mEq/L (ref 96–112)
Creatinine, Ser: 1.4 mg/dL — ABNORMAL HIGH (ref 0.4–1.2)
Total Bilirubin: 0.3 mg/dL (ref 0.3–1.2)

## 2012-02-22 ENCOUNTER — Encounter: Payer: Self-pay | Admitting: Internal Medicine

## 2012-02-23 ENCOUNTER — Encounter (HOSPITAL_COMMUNITY): Payer: Self-pay

## 2012-02-23 ENCOUNTER — Emergency Department (INDEPENDENT_AMBULATORY_CARE_PROVIDER_SITE_OTHER): Payer: Medicare Other

## 2012-02-23 ENCOUNTER — Emergency Department (INDEPENDENT_AMBULATORY_CARE_PROVIDER_SITE_OTHER)
Admission: EM | Admit: 2012-02-23 | Discharge: 2012-02-23 | Disposition: A | Discharge status: Home / Self Care | Payer: Medicare Other | Source: Home / Self Care | Attending: Emergency Medicine | Admitting: Emergency Medicine

## 2012-02-23 DIAGNOSIS — M79609 Pain in unspecified limb: Secondary | ICD-10-CM

## 2012-02-23 DIAGNOSIS — M79672 Pain in left foot: Secondary | ICD-10-CM

## 2012-02-23 LAB — POCT I-STAT, CHEM 8
BUN: 24 mg/dL — ABNORMAL HIGH (ref 6–23)
Chloride: 104 mEq/L (ref 96–112)
Glucose, Bld: 110 mg/dL — ABNORMAL HIGH (ref 70–99)
HCT: 42 % (ref 36.0–46.0)
Potassium: 4.4 mEq/L (ref 3.5–5.1)

## 2012-02-23 MED ORDER — DICLOFENAC SODIUM 1 % TD GEL: 1.0000 "application " | Freq: Four times a day (QID) | TRANSDERMAL | Status: DC

## 2012-02-23 MED ORDER — PREDNISONE 20 MG PO TABS: ORAL_TABLET | ORAL | Status: AC

## 2012-02-23 NOTE — Discharge Instructions (Signed)
Continue taking Tylenol. You may take 1 g of 4 times a day. The steroids and the diclofenac gel will also help. Avoid foods and other known triggers. Check a formal diagnosis of gout, you'll need to have a synovial fluid analysis done when you're having an acute attack. This is best done by a podiatrist or an orthopedic surgeon. Return if you've a fever, if you get worse, or for any other concerns.

## 2012-02-23 NOTE — ED Provider Notes (Signed)
History     CSN: [id number]  Arrival date & time 02/23/12  4098   First MD Initiated Contact with Patient 02/23/12 [phone]      Chief Complaint  Patient presents with  . Foot Pain    (Consider location/radiation/quality/duration/timing/severity/associated sxs/prior treatment) HPI Comments: Patient reports left lateral foot pain, hyperesthesias, redness, swelling starting for 5 days ago. Patient states she ate shellfish the day prior to her symptoms starting. Patient has had similar episodes in this foot before, usually after eating some shellfish. Patient has tried elevating its and taking Tylenol without much relief. Patient reports no other joint swelling, redness. No recent or remote history of trauma to her foot. No nausea, vomiting, paresthesias, weakness, numbness, bruising. Pain is worse with walking, in no alleviating factors. Patient has no previous we'll diagnosis gout. She is, however, on diuretics. Family history positive for mother with gout.  ROS as noted in HPI. All other ROS negative.   Patient is a 71 y.o. female presenting with lower extremity pain. The history is provided by the patient. No language interpreter was used.  Foot Pain This is a recurrent problem. The current episode started more than 2 days ago. The problem occurs constantly. The problem has been gradually improving. The symptoms are aggravated by walking. The symptoms are relieved by nothing. She has tried acetaminophen for the symptoms. The treatment provided no relief.    Past Medical History  Diagnosis Date  . Plantar fascial fibromatosis   . Personal history of unspecified circulatory disease   . Contact dermatitis and other eczema, due to unspecified cause   . History of sinusitis   . Hypertension   . Chronic back pain   . Hypertension   . Allergic rhinitis     Past Surgical History  Procedure Date  . History of lumbar fusion     '87  . Abdominal hysterectomy   . Foot surgery      History reviewed. No pertinent family history.  History  Substance Use Topics  . Smoking status: Never Smoker   . Smokeless tobacco: Never Used  . Alcohol Use: No    OB History    Grav Para Term Preterm Abortions TAB SAB Ect Mult Living                  Review of Systems  Allergies  Lisinopril  Home Medications   Current Outpatient Rx  Name Route Sig Dispense Refill  . ASPIRIN 81 MG PO TABS Oral Take 81 mg by mouth daily.      . BUMETANIDE 1 MG PO TABS Oral Take 1 tablet (1 mg total) by mouth daily. 90 tablet 1  . DILTIAZEM HCL ER BEADS 300 MG PO CP24 Oral Take 1 capsule (300 mg total) by mouth daily. 90 capsule 1  . ESTROPIPATE 1.5 MG PO TABS Oral Take 1 tablet (1.5 mg total) by mouth daily. 90 tablet 1  . FUROSEMIDE 20 MG PO TABS Oral Take 1 tablet (20 mg total) by mouth daily. 90 tablet 1  . LOSARTAN POTASSIUM 100 MG PO TABS Oral Take 1 tablet (100 mg total) by mouth daily. 90 tablet 3  . METOPROLOL TARTRATE 50 MG PO TABS Oral Take 1 tablet (50 mg total) by mouth daily. 90 tablet 1  . ZOLPIDEM TARTRATE 10 MG PO TABS Oral Take 1 tablet (10 mg total) by mouth at bedtime as needed. 90 tablet 1  . DICLOFENAC SODIUM 1 % TD GEL Topical Apply 1 application topically 4 (  four) times daily. 100 g 0  . HYDROCODONE-ACETAMINOPHEN 7.5-750 MG PO TABS Oral Take 1 tablet by mouth every 6 (six) hours as needed. 30 tablet 5  . PREDNISONE 20 MG PO TABS  2 tabs po once daily x 3 days 6 tablet 0    BP 145/54  Pulse 79  Temp(Src) 97.9 F (36.6 C) (Oral)  Resp 14  SpO2 100%  Physical Exam  Nursing note and vitals reviewed. Constitutional: She is oriented to person, place, and time. She appears well-developed and well-nourished. No distress.  HENT:  Head: Normocephalic and atraumatic.  Eyes: Conjunctivae and EOM are normal.  Neck: Normal range of motion.  Cardiovascular: Normal rate.   Pulmonary/Chest: Effort normal.  Abdominal: She exhibits no distension.  Musculoskeletal:  Normal range of motion.       Left foot mildly swollen, erythematous. Skin intact. No bruising. Tenderness over fifth metatarsal and lateral aspect of mid foot. MTP joint mildly erythematous, but minimally painful. Sensation intact. Patient able to move all toes. DP 2+. Ankle within normal limits.  Neurological: She is alert and oriented to person, place, and time.  Skin: Skin is warm and dry.  Psychiatric: She has a normal mood and affect. Her behavior is normal. Judgment and thought content normal.    ED Course  Procedures (including critical care time)  Labs Reviewed  URIC ACID - Abnormal; Notable for the following:    Uric Acid, Serum 10.3 (*)    All other components within normal limits  POCT I-STAT, CHEM 8 - Abnormal; Notable for the following:    BUN 24 (*)    Creatinine, Ser 1.20 (*)    Glucose, Bld 110 (*)    All other components within normal limits     1. Foot pain, left    Results for orders placed during the hospital encounter of 02/23/12  URIC ACID      Component Value Range   Uric Acid, Serum 10.3 (*) 2.4 - 7.0 (mg/dL)  POCT I-STAT, CHEM 8      Component Value Range   Sodium 143  135 - 145 (mEq/L)   Potassium 4.4  3.5 - 5.1 (mEq/L)   Chloride 104  96 - 112 (mEq/L)   BUN 24 (*) 6 - 23 (mg/dL)   Creatinine, Ser 8.29 (*) 0.50 - 1.10 (mg/dL)   Glucose, Bld 562 (*) 70 - 99 (mg/dL)   Calcium, Ion 1.30  8.65 - 1.32 (mmol/L)   TCO2 28  0 - 100 (mmol/L)   Hemoglobin 14.3  12.0 - 15.0 (g/dL)   HCT 78.4  69.6 - 29.5 (%)   Dg Foot Complete Left  02/23/2012  *RADIOLOGY REPORT*  Clinical Data: Foot pain.  LEFT FOOT - COMPLETE 3+ VIEW  Comparison: None  Findings: There are surgical changes involving the left fifth toe with partial amputation of the proximal phalanx.  No acute bony findings or destructive bony changes.  There is mild soft tissue swelling noted around the fifth metatarsal phalangeal joint. Calcifications are noted along the distal Achilles tendon and the  plantar fascia.  IMPRESSION:  1.  Remote surgical changes involving the proximal phalanx of the fifth digit. 2.  No acute bony findings. 3.  Soft tissue swelling noted around the fifth metatarsal phalangeal joint.  Original Report Authenticated By: P. Loralie Champagne, M.D.    X-ray reviewed by myself. No fracture. Full report per radiologist.  MDM  Previous records, labs reviewed. No history of gout, previous uric acid. Patient has recently  changed to losartan. BMP last week showed renal insufficiency BUN/creatinine 30/1.4. Rechecking i-STAT to guide and set therapy, sending off uric acid.    H&P suggestive of a gouty arthritis, however, pain is in an atypical location. Uric acid is elevated at 10.7. Will check foot x-ray to rule out fracture. Patient states she has a cane at home, and will have her start using this when her foot "flares up". Will send her home with Ace wrap and stiff soled shoe. Discussed plan, MDM with patient, who agrees with plan. We'll CC Dr. Debby Bud re; today's uric acid and BMP result.  Luiz Blare, MD 02/25/12 [phone]

## 2012-02-23 NOTE — ED Notes (Signed)
C/o pain in lt foot since 02/21/12.  Denies injury.

## 2012-02-25 ENCOUNTER — Telehealth (HOSPITAL_COMMUNITY): Payer: Self-pay | Admitting: *Deleted

## 2012-02-25 NOTE — ED Notes (Signed)
Uric Acid 10.3 H. Message from Dr. Chaney Malling said she had sent the result to her PCP Dr. Arthur Holms. I sent a message back to Dr. Chaney Malling that I would notify the pt.  Pt. called and verified. Pt. given uric acid result. Pt. states she has had the gout before but never that bad. I told her we sent the result to Dr. Arthur Holms and  she needs to f/u up with him for possible long term treatment to lower the uric acid. This would help prevent the outbreaks of gout. Pt. Stated she was going to see him tomorrow.  Sarah [name] 02/25/2012

## 2012-02-26 ENCOUNTER — Encounter: Payer: Self-pay | Admitting: Internal Medicine

## 2012-02-26 ENCOUNTER — Ambulatory Visit (INDEPENDENT_AMBULATORY_CARE_PROVIDER_SITE_OTHER): Payer: Medicare Other | Admitting: Internal Medicine

## 2012-02-26 VITALS — BP 130/74 | HR 73 | Temp 98.1°F | Resp 14 | Wt 159.2 lb

## 2012-02-26 DIAGNOSIS — R7989 Other specified abnormal findings of blood chemistry: Secondary | ICD-10-CM

## 2012-02-26 DIAGNOSIS — I10 Essential (primary) hypertension: Secondary | ICD-10-CM

## 2012-02-26 DIAGNOSIS — E790 Hyperuricemia without signs of inflammatory arthritis and tophaceous disease: Secondary | ICD-10-CM

## 2012-02-26 MED ORDER — COLCHICINE 0.6 MG PO TABS: 0.6000 mg | ORAL_TABLET | Freq: Every day | ORAL | Status: DC

## 2012-02-26 MED ORDER — ALLOPURINOL 300 MG PO TABS: 300.0000 mg | ORAL_TABLET | Freq: Every day | ORAL | Status: DC

## 2012-02-26 NOTE — Patient Instructions (Signed)
Gout Gout is an inflammatory condition (arthritis) caused by a buildup of uric acid crystals in the joints. Uric acid is a chemical that is normally present in the blood. Under some circumstances, uric acid can form into crystals in your joints. This causes joint redness, soreness, and swelling (inflammation). Repeat attacks are common. Over time, uric acid crystals can form into masses (tophi) near a joint, causing disfigurement. Gout is treatable and often preventable. CAUSES   The disease begins with elevated levels of uric acid in the blood. Uric acid is produced by your body when it breaks down a naturally found substance called purines. This also happens when you eat certain foods such as meats and fish. Causes of an elevated uric acid level include:  Being passed down from parent to child (heredity).   Diseases that cause increased uric acid production (obesity, psoriasis, some cancers).   Excessive alcohol use.   Diet, especially diets rich in meat and seafood.   Medicines, including certain cancer-fighting drugs (chemotherapy), diuretics, and aspirin.   Chronic kidney disease. The kidneys are no longer able to remove uric acid well.   Problems with metabolism.  Conditions strongly associated with gout include:  Obesity.   High blood pressure.   High cholesterol.   Diabetes.  Not everyone with elevated uric acid levels gets gout. It is not understood why some people get gout and others do not. Surgery, joint injury, and eating too much of certain foods are some of the factors that can lead to gout. SYMPTOMS    An attack of gout comes on quickly. It causes intense pain with redness, swelling, and warmth in a joint.   Fever can occur.   Often, only one joint is involved. Certain joints are more commonly involved:   Base of the big toe.   Knee.   Ankle.   Wrist.   Finger.  Without treatment, an attack usually goes away in a few days to weeks. Between attacks, you  usually will not have symptoms, which is different from many other forms of arthritis. DIAGNOSIS   Your caregiver will suspect gout based on your symptoms and exam. Removal of fluid from the joint (arthrocentesis) is done to check for uric acid crystals. Your caregiver will give you a medicine that numbs the area (local anesthetic) and use a needle to remove joint fluid for exam. Gout is confirmed when uric acid crystals are seen in joint fluid, using a special microscope. Sometimes, blood, urine, and X-ray tests are also used. TREATMENT   There are 2 phases to gout treatment: treating the sudden onset (acute) attack and preventing attacks (prophylaxis). Treatment of an Acute Attack  Medicines are used. These include anti-inflammatory medicines or steroid medicines.   An injection of steroid medicine into the affected joint is sometimes necessary.   The painful joint is rested. Movement can worsen the arthritis.   You may use warm or cold treatments on painful joints, depending which works best for you.   Discuss the use of coffee, vitamin C, or cherries with your caregiver. These may be helpful treatment options.  Treatment to Prevent Attacks After the acute attack subsides, your caregiver may advise prophylactic medicine. These medicines either help your kidneys eliminate uric acid from your body or decrease your uric acid production. You may need to stay on these medicines for a very long time. The early phase of treatment with prophylactic medicine can be associated with an increase in acute gout attacks. For this reason,  during the first few months of treatment, your caregiver may also advise you to take medicines usually used for acute gout treatment. Be sure you understand your caregiver's directions. You should also discuss dietary treatment with your caregiver. Certain foods such as meats and fish can increase uric acid levels. Other foods such as dairy can decrease levels. Your caregiver  can give you a list of foods to avoid. HOME CARE INSTRUCTIONS    Do not take aspirin to relieve pain. This raises uric acid levels.   Only take over-the-counter or prescription medicines for pain, discomfort, or fever as directed by your caregiver.   Rest the joint as much as possible. When in bed, keep sheets and blankets off painful areas.   Keep the affected joint raised (elevated).   Use crutches if the painful joint is in your leg.   Drink enough water and fluids to keep your urine clear or pale yellow. This helps your body get rid of uric acid. Do not drink alcoholic beverages. They slow the passage of uric acid.   Follow your caregiver's dietary instructions. Pay careful attention to the amount of protein you eat. Your daily diet should emphasize fruits, vegetables, whole grains, and fat-free or low-fat milk products.   Maintain a healthy body weight.  SEEK MEDICAL CARE IF:    You have an oral temperature above 102 F (38.9 C).   You develop diarrhea, vomiting, or any side effects from medicines.   You do not feel better in 24 hours, or you are getting worse.  SEEK IMMEDIATE MEDICAL CARE IF:    Your joint becomes suddenly more tender and you have:   Chills.   An oral temperature above 102 F (38.9 C), not controlled by medicine.  MAKE SURE YOU:    Understand these instructions.   Will watch your condition.   Will get help right away if you are not doing well or get worse.  Document Released: 11/14/2000 Document Revised: 11/06/2011 Document Reviewed: 02/25/2010 [facility] Patient Information 2012 [place], [place].   Purine Restricted Diet A low-purine diet consists of foods that reduce uric acid made in your body. INDICATIONS FOR USE   Your caregiver may ask you to follow a low-purine diet to reduce gout flairs.   GUIDELINES   Avoid high-purine foods, including all alcohol, yeast extracts taken as supplements, and sauces made from meats (like gravy). Do not eat  high-purine meats, including anchovies, sardines, herring, mussels, tuna, codfish, scallops, trout, haddock, bacon, organ meats, tripe, goose, wild game, and sweetbreads.   Grains  Allowed/Recommended: All, except those listed to consume in moderation.   Consume in Moderation: Oatmeal (? cup uncooked daily), wheat bran or germ ( cup daily), and whole grains.  Vegetables  Allowed/Recommended: All, except those listed to consume in moderation.   Consume in Moderation: Asparagus, cauliflower, spinach, mushrooms, and green peas ( cup daily).  Fruit  Allowed/Recommended: All.   Consume in Moderation: None.  Meat and Meat Substitutes  Allowed/Recommended: Eggs, nuts, and peanut butter.   Consume in Moderation: Limit to 4 to 6 oz daily. Avoid high-purine meats. Lentils, peas, and dried beans (1 cup daily).  Milk  Allowed/Recommended: All. Choose low-fat or skim when possible.   Consume in Moderation: None.  Fats and Oils  Allowed/Recommended: All.   Consume in Moderation: None.  Beverages  Allowed/Recommended: All, except those listed to avoid.   Avoid: All alcohol.  Condiments/Miscellaneous  Allowed/Recommended: All, except those listed to consume in moderation.   Consume  in Moderation: Bouillon and meat-based broths and soups.  Document Released: 03/14/2011 Document Revised: 11/06/2011 Document Reviewed: 03/14/2011 [facility] Patient Information 2012 [place], [place].  To prevent gout we will start you on allopurinol to lower the uric acid.  To protect you from a drug induced flare of gout you will need to take colchicine once a day for at least two month or until the uric acid level has come down. The Rx is sent to your provider. You will need to come in for lab two months after starting the medications.

## 2012-02-29 DIAGNOSIS — E790 Hyperuricemia without signs of inflammatory arthritis and tophaceous disease: Secondary | ICD-10-CM | POA: Insufficient documentation

## 2012-02-29 NOTE — Assessment & Plan Note (Signed)
Reviewed mechanism of gout with patient and reviewed with her labs and x-rays. Stressed the importance of bringing down uric acid level.  Plan - allopurinol 300 mg daily           Colchicine 0.6 mg daily, starting 7-10 days before starting allopurinol to avoid precipitating a sudden flare, and continue until Uric Acid level is down.

## 2012-02-29 NOTE — Assessment & Plan Note (Signed)
BP Readings from Last 3 Encounters:  02/26/12 130/74  02/23/12 145/54  02/16/12 124/58   OK control.

## 2012-02-29 NOTE — Progress Notes (Signed)
  Subjective:    Patient ID: Sarah [name], female    DOB: 10-28-1941, 71 y.o.   MRN: [id number]  HPI [name] was recently seen byc Dr. Chaney Malling in the ED for a painful foot. Notes and imaging reviewed: no fracture or joint disease. No changes of gout or presence of tophi. Labs revealed a uric acid level of greater than 10. 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 She was put on steroids for her foot pain with good results. Today she has no other new complaints.  Past Medical History  Diagnosis Date  . Plantar fascial fibromatosis   . Personal history of unspecified circulatory disease   . Contact dermatitis and other eczema, due to unspecified cause   . History of sinusitis   . Hypertension   . Chronic back pain   . Hypertension   . Allergic rhinitis    Past Surgical History  Procedure Date  . History of lumbar fusion     '87  . Abdominal hysterectomy   . Foot surgery    No family history on file. History   Social History  . Marital Status: Married    Spouse Name: N/A    Number of Children: N/A  . Years of Education: N/A   Occupational History  . Not on file.   Social History Main Topics  . Smoking status: Never Smoker   . Smokeless tobacco: Never Used  . Alcohol Use: No  . Drug Use: No  . Sexually Active: Not on file   Other Topics Concern  . Not on file   Social History Narrative   HSG. Married- '63. 1 daughter '64, 1 son '70,  2 grandchildren. Work: retired. Lives with husband and brother is in the home.      .Review of Systems System review is negative for any constitutional, cardiac, pulmonary, GI or neuro symptoms or complaints other than as described in the HPI.     Objective:   Physical Exam Filed Vitals:   02/26/12 1130  BP: 130/74  Pulse: 73  Temp: 98.1 F (36.7 C)  Resp: 14   Gen'l - WNWD AA woman in no distress C&S clear PUlm - normal respirations Cor - RRR Ext - no erythema  of the foot. Mild tenderness.       Assessment & Plan:

## 2012-05-10 ENCOUNTER — Ambulatory Visit (INDEPENDENT_AMBULATORY_CARE_PROVIDER_SITE_OTHER): Payer: Medicare Other | Admitting: Internal Medicine

## 2012-05-10 ENCOUNTER — Encounter: Payer: Self-pay | Admitting: Internal Medicine

## 2012-05-10 ENCOUNTER — Other Ambulatory Visit (INDEPENDENT_AMBULATORY_CARE_PROVIDER_SITE_OTHER): Payer: Medicare Other

## 2012-05-10 VITALS — BP 134/68 | HR 84 | Temp 98.2°F | Resp 16 | Ht 63.0 in | Wt 154.0 lb

## 2012-05-10 DIAGNOSIS — R7989 Other specified abnormal findings of blood chemistry: Secondary | ICD-10-CM

## 2012-05-10 DIAGNOSIS — I10 Essential (primary) hypertension: Secondary | ICD-10-CM

## 2012-05-10 DIAGNOSIS — E790 Hyperuricemia without signs of inflammatory arthritis and tophaceous disease: Secondary | ICD-10-CM

## 2012-05-10 LAB — URIC ACID: Uric Acid, Serum: 2.5 mg/dL (ref 2.4–7.0)

## 2012-05-10 MED ORDER — LOSARTAN POTASSIUM 100 MG PO TABS: 100.0000 mg | ORAL_TABLET | Freq: Every day | ORAL | Status: DC

## 2012-05-10 NOTE — Patient Instructions (Signed)
Uric acid levels - will check lab today. Continue the allopurinol but you can stop the colchicine.  Blood pressure looks good today. This was without furosemide: ok to stop furosemide but continue other blood pressure medications.

## 2012-05-10 NOTE — Progress Notes (Signed)
  Subjective:    Patient ID: Sarah [name], female    DOB: May 04, 1941, 71 y.o.   MRN: [id number]  HPI Mrs. Ruark presents for follow up of hyperuricemia. She has been on allopurinol for 3 months along with colchicine to prevent gout flare. She has been doing well.  She has pain in the left forearm and pain in her arm that is positional. She has been told that she has a bad neck by Dr. Otelia Sergeant - she had a CT scan in is office.  Past Medical History  Diagnosis Date  . Plantar fascial fibromatosis   . Personal history of unspecified circulatory disease   . Contact dermatitis and other eczema, due to unspecified cause   . History of sinusitis   . Hypertension   . Chronic back pain   . Hypertension   . Allergic rhinitis    Past Surgical History  Procedure Date  . History of lumbar fusion     '87  . Abdominal hysterectomy   . Foot surgery    No family history on file. History   Social History  . Marital Status: Married    Spouse Name: N/A    Number of Children: N/A  . Years of Education: N/A   Occupational History  . Not on file.   Social History Main Topics  . Smoking status: Never Smoker   . Smokeless tobacco: Never Used  . Alcohol Use: No  . Drug Use: No  . Sexually Active: Not on file   Other Topics Concern  . Not on file   Social History Narrative   HSG. Married- '63. 1 daughter '64, 1 son '70,  2 grandchildren. Work: retired. Lives with husband and brother is in the home.       Review of Systems System review is negative for any constitutional, cardiac, pulmonary, GI or neuro symptoms or complaints other than as described in the HPI.     Objective:   Physical Exam Filed Vitals:   05/10/12 1151  BP: 134/68  Pulse: 84  Temp: 98.2 F (36.8 C)  Resp: 16   WNWD AA female in no distress MSK - no tenderness at the epicondyles. Tender at the soft muscle proximal forearm. Pain with adduction of the left UE>   Uric acid 2.5 (down from 10.3)        Assessment & Plan:

## 2012-05-11 ENCOUNTER — Other Ambulatory Visit: Payer: Self-pay | Admitting: *Deleted

## 2012-05-11 ENCOUNTER — Telehealth: Payer: Self-pay | Admitting: *Deleted

## 2012-05-11 MED ORDER — ALLOPURINOL 300 MG PO TABS: 300.0000 mg | ORAL_TABLET | Freq: Every day | ORAL | Status: DC

## 2012-05-11 NOTE — Telephone Encounter (Signed)
Patient notified of lab results and directions to continue allopurinol and stop colcrys. Patient request refill on allopurinol. Will send in to mail order pharmacy

## 2012-05-11 NOTE — Assessment & Plan Note (Signed)
BP Readings from Last 3 Encounters:  05/10/12 134/68  02/26/12 130/74  02/23/12 145/54   OK control.  Plan D/c furosemide

## 2012-05-11 NOTE — Telephone Encounter (Signed)
Message copied by Elnora Morrison on Tue May 11, 2012 10:49 AM ------      Message from: Jacques Navy      Created: Tue May 11, 2012  7:55 AM       Call pt Uric Acid 2.5. Continue allopurinol and stop colcrys

## 2012-05-11 NOTE — Telephone Encounter (Signed)
Refill sent allopurinol sent to [facility]

## 2012-05-11 NOTE — Assessment & Plan Note (Signed)
Tolerating allopurinol. Uric acid level 2.5  Plan Continue allopurinol  D/c colchicine

## 2012-07-01 ENCOUNTER — Other Ambulatory Visit: Payer: Self-pay

## 2012-07-01 MED ORDER — DILTIAZEM HCL ER BEADS 300 MG PO CP24: 300.0000 mg | ORAL_CAPSULE | Freq: Every day | ORAL | Status: DC

## 2012-07-01 MED ORDER — METOPROLOL TARTRATE 50 MG PO TABS: 50.0000 mg | ORAL_TABLET | Freq: Every day | ORAL | Status: DC

## 2012-07-01 NOTE — Telephone Encounter (Signed)
Rx refill to [facility]

## 2012-07-02 MED ORDER — ZOLPIDEM TARTRATE 10 MG PO TABS: 10.0000 mg | ORAL_TABLET | Freq: Every evening | ORAL | Status: DC | PRN

## 2012-07-19 ENCOUNTER — Other Ambulatory Visit: Payer: Self-pay

## 2012-07-19 ENCOUNTER — Telehealth: Payer: Self-pay

## 2012-07-19 NOTE — Telephone Encounter (Signed)
A user error has taken place: encounter opened in error, closed for administrative reasons.

## 2012-07-19 NOTE — Telephone Encounter (Signed)
Pt called to check the status of Ambien Rx that was printed 08/01, Rx was to be faxed to [facility], please advise pt.

## 2012-07-19 NOTE — Telephone Encounter (Signed)
SPOKE WITH PHARMACY AT [facility]. RX CALLED TO PHARMACY. AT [facility]. PATIENT NOTIFIED

## 2012-11-08 ENCOUNTER — Other Ambulatory Visit: Payer: Self-pay | Admitting: Specialist

## 2012-11-08 DIAGNOSIS — M25512 Pain in left shoulder: Secondary | ICD-10-CM

## 2012-11-08 DIAGNOSIS — R531 Weakness: Secondary | ICD-10-CM

## 2012-11-12 ENCOUNTER — Ambulatory Visit
Admission: RE | Admit: 2012-11-12 | Discharge: 2012-11-12 | Disposition: A | Discharge status: Home / Self Care | Payer: Medicare Other | Source: Ambulatory Visit | Attending: Specialist | Admitting: Specialist

## 2012-11-12 DIAGNOSIS — M25512 Pain in left shoulder: Secondary | ICD-10-CM

## 2012-11-12 DIAGNOSIS — R531 Weakness: Secondary | ICD-10-CM

## 2012-12-08 ENCOUNTER — Ambulatory Visit (INDEPENDENT_AMBULATORY_CARE_PROVIDER_SITE_OTHER): Payer: Medicare Other | Admitting: Internal Medicine

## 2012-12-08 ENCOUNTER — Encounter: Payer: Self-pay | Admitting: Internal Medicine

## 2012-12-08 ENCOUNTER — Other Ambulatory Visit: Payer: Self-pay | Admitting: *Deleted

## 2012-12-08 ENCOUNTER — Other Ambulatory Visit (INDEPENDENT_AMBULATORY_CARE_PROVIDER_SITE_OTHER): Payer: Medicare Other

## 2012-12-08 VITALS — BP 130/70 | HR 66 | Temp 98.0°F | Resp 10 | Wt 154.1 lb

## 2012-12-08 DIAGNOSIS — S43429A Sprain of unspecified rotator cuff capsule, initial encounter: Secondary | ICD-10-CM

## 2012-12-08 DIAGNOSIS — M75102 Unspecified rotator cuff tear or rupture of left shoulder, not specified as traumatic: Secondary | ICD-10-CM

## 2012-12-08 DIAGNOSIS — I10 Essential (primary) hypertension: Secondary | ICD-10-CM

## 2012-12-08 DIAGNOSIS — Z23 Encounter for immunization: Secondary | ICD-10-CM

## 2012-12-08 LAB — COMPREHENSIVE METABOLIC PANEL
ALT: 23 U/L (ref 0–35)
AST: 20 U/L (ref 0–37)
Albumin: 4.4 g/dL (ref 3.5–5.2)
BUN: 28 mg/dL — ABNORMAL HIGH (ref 6–23)
CO2: 27 mEq/L (ref 19–32)
Calcium: 9.8 mg/dL (ref 8.4–10.5)
Chloride: 107 mEq/L (ref 96–112)
Potassium: 4.8 mEq/L (ref 3.5–5.1)

## 2012-12-08 MED ORDER — METOPROLOL TARTRATE 50 MG PO TABS: 50.0000 mg | ORAL_TABLET | Freq: Every day | ORAL | Status: DC

## 2012-12-08 MED ORDER — DILTIAZEM HCL ER BEADS 300 MG PO CP24: 300.0000 mg | ORAL_CAPSULE | Freq: Every day | ORAL | Status: DC

## 2012-12-08 MED ORDER — ESTROPIPATE 1.5 MG PO TABS: 1.5000 mg | ORAL_TABLET | Freq: Every day | ORAL | Status: DC

## 2012-12-08 MED ORDER — ZOLPIDEM TARTRATE 10 MG PO TABS: 10.0000 mg | ORAL_TABLET | Freq: Every evening | ORAL | Status: DC | PRN

## 2012-12-08 NOTE — Patient Instructions (Addendum)
Blood pressure - looks good. Plan  Continue your medications  Routine lab today.,  Shoulder pain - Torn rotator cuff left Plan Consult with Dr. Magnus Ivan. This may be a problem that can be approached with arthroscopy which does not require general anesthesia. If it is more complicated we will get the records from [place] to find out what really happened so we know what you can safely tolerate.   YOur medications will be called.

## 2012-12-08 NOTE — Progress Notes (Signed)
Subjective:    Patient ID: Sarah [name], female    DOB: 11-08-1941, 72 y.o.   MRN: [id number]  HPI Sarah [name] presents for follow up. She has been having left shoulder pain and limitation in ROM - having about 55% of normal. She has had an MRI which reveals a 50% rotator cuff tear. She is to see Dr. Magnus Ivan for surgical consultation. She is very anxious about the possibility of general anesthesia.  Her BP has been well controlled.   She reports that she hydrates very well but does have nocturia x 2.  \ Past Medical History  Diagnosis Date  . Plantar fascial fibromatosis   . Personal history of unspecified circulatory disease   . Contact dermatitis and other eczema, due to unspecified cause   . History of sinusitis   . Hypertension   . Chronic back pain   . Hypertension   . Allergic rhinitis    Past Surgical History  Procedure Date  . History of lumbar fusion     '87  . Abdominal hysterectomy   . Foot surgery    History reviewed. No pertinent family history. History   Social History  . Marital Status: Married    Spouse Name: N/A    Number of Children: N/A  . Years of Education: N/A   Occupational History  . Not on file.   Social History Main Topics  . Smoking status: Never Smoker   . Smokeless tobacco: Never Used  . Alcohol Use: No  . Drug Use: No  . Sexually Active: Not on file   Other Topics Concern  . Not on file   Social History Narrative   HSG. Married- '63. 1 daughter '64, 1 son '70,  2 grandchildren. Work: retired. Lives with husband and brother is in the home.    Current Outpatient Prescriptions on File Prior to Visit  Medication Sig Dispense Refill  . allopurinol (ZYLOPRIM) 300 MG tablet Take 1 tablet (300 mg total) by mouth daily.  90 tablet  3  . aspirin 81 MG tablet Take 81 mg by mouth daily.        . diclofenac sodium (VOLTAREN) 1 % GEL Apply 1 application topically 4 (four) times daily.  100 g  0  . zolpidem (AMBIEN) 10 MG tablet Take 1  tablet (10 mg total) by mouth at bedtime as needed.  90 tablet  1  . diltiazem (TIAZAC) 300 MG 24 hr capsule Take 1 capsule (300 mg total) by mouth daily.  90 capsule  1  . estropipate (OGEN) 1.5 MG tablet Take 1 tablet (1.5 mg total) by mouth daily.  90 tablet  1  . losartan (COZAAR) 100 MG tablet Take 1 tablet (100 mg total) by mouth daily.  90 tablet  3  . metoprolol (LOPRESSOR) 50 MG tablet Take 1 tablet (50 mg total) by mouth daily.  90 tablet  1  . [DISCONTINUED] valsartan (DIOVAN) 160 MG tablet Take 1 tablet (160 mg total) by mouth daily.  90 tablet  1      Review of Systems System review is negative for any constitutional, cardiac, pulmonary, GI or neuro symptoms or complaints other than as described in the HPI.     Objective:   Physical Exam Filed Vitals:   12/08/12 1028  BP: 130/70  Pulse: 66  Temp: 98 F (36.7 C)  Resp: 10   Gen'l - WNWD AA woman in no distress HEENT- mild proptosis, muddy sclera Cor- 2+ RRR Pulm - clear  to exam MSK - decreased ROM left shoulder       Assessment & Plan:

## 2012-12-12 ENCOUNTER — Encounter: Payer: Self-pay | Admitting: Internal Medicine

## 2012-12-12 DIAGNOSIS — M75102 Unspecified rotator cuff tear or rupture of left shoulder, not specified as traumatic: Secondary | ICD-10-CM | POA: Insufficient documentation

## 2012-12-12 NOTE — Assessment & Plan Note (Signed)
Sarah [name] with left rotator cuff tear with appointment with orthopedics pending. She is very nervous and anxious about any potential anesthesia associated with surgery due to anesthesia related complications with surgery in [place] many years ago. She is offered reassurance: needs to see ortho for assessment and treatment planning, if general anesthesia will be required for repair then she will need to obtain records from hospital in [place] so that full review of previous complications can be reviewed and recommendations made. She may be a candidate for arthroscopic surgery w/o general anesthesia.

## 2012-12-13 ENCOUNTER — Other Ambulatory Visit: Payer: Self-pay | Admitting: *Deleted

## 2012-12-13 MED ORDER — ESTROPIPATE 1.5 MG PO TABS: 1.5000 mg | ORAL_TABLET | Freq: Every day | ORAL | Status: DC

## 2012-12-24 ENCOUNTER — Other Ambulatory Visit (HOSPITAL_COMMUNITY): Payer: Self-pay | Admitting: Orthopaedic Surgery

## 2013-01-03 ENCOUNTER — Encounter (HOSPITAL_COMMUNITY): Payer: Self-pay | Admitting: Pharmacy Technician

## 2013-01-06 NOTE — Patient Instructions (Signed)
Sarah [name]  01/06/2013   Your procedure is scheduled on:  01/14/13   Report to [facility] at 1200noon  Call this number if you have problems the morning of surgery: [phone]   Remember:   Do not eat food or drink liquids after midnight.   Take these medicines the morning of surgery with A SIP OF WATER:    Do not wear jewelry, make-up or nail polish.  Do not wear lotions, powders, or perfumes.   Do not shave 48 hours prior to surgery.   Do not bring valuables to the hospital.  Contacts, dentures or bridgework may not be worn into surgery.  Leave suitcase in the car. After surgery it may be brought to your room.  For patients admitted to the hospital, checkout time is 11:00 AM the day of  discharge.      SEE CHG INSTRUCTION SHEET    Please read over the following fact sheets that you were given: MRSA Information, coughing and deep breathing exercises, leg exercises               Failure to comply with these instructions may result in cancellation of your surgery.                Patient Signature ____________________________              Nurse Signature _____________________________

## 2013-01-07 ENCOUNTER — Inpatient Hospital Stay (HOSPITAL_COMMUNITY): Admission: RE | Admit: 2013-01-07 | Payer: Medicare Other | Source: Ambulatory Visit

## 2013-01-07 ENCOUNTER — Encounter (HOSPITAL_COMMUNITY): Payer: Self-pay

## 2013-01-07 ENCOUNTER — Ambulatory Visit (HOSPITAL_COMMUNITY)
Admission: RE | Admit: 2013-01-07 | Discharge: 2013-01-07 | Disposition: A | Discharge status: Home / Self Care | Payer: Medicare Other | Source: Ambulatory Visit | Attending: Orthopaedic Surgery | Admitting: Orthopaedic Surgery

## 2013-01-07 ENCOUNTER — Encounter (HOSPITAL_COMMUNITY)
Admission: RE | Admit: 2013-01-07 | Discharge: 2013-01-07 | Disposition: A | Discharge status: Home / Self Care | Payer: Medicare Other | Source: Ambulatory Visit | Attending: Orthopaedic Surgery | Admitting: Orthopaedic Surgery

## 2013-01-07 DIAGNOSIS — Z01810 Encounter for preprocedural cardiovascular examination: Secondary | ICD-10-CM | POA: Insufficient documentation

## 2013-01-07 DIAGNOSIS — Z01812 Encounter for preprocedural laboratory examination: Secondary | ICD-10-CM | POA: Insufficient documentation

## 2013-01-07 DIAGNOSIS — J984 Other disorders of lung: Secondary | ICD-10-CM | POA: Insufficient documentation

## 2013-01-07 DIAGNOSIS — Z01818 Encounter for other preprocedural examination: Secondary | ICD-10-CM | POA: Insufficient documentation

## 2013-01-07 HISTORY — DX: Shortness of breath: R06.02

## 2013-01-07 HISTORY — DX: Cardiac arrhythmia, unspecified: I49.9

## 2013-01-07 HISTORY — DX: Peripheral vascular disease, unspecified: I73.9

## 2013-01-07 HISTORY — DX: Unspecified osteoarthritis, unspecified site: M19.90

## 2013-01-07 LAB — CBC
MCH: 30.5 pg (ref 26.0–34.0)
MCV: 91.5 fL (ref 78.0–100.0)
Platelets: 246 10*3/uL (ref 150–400)
RBC: 4 MIL/uL (ref 3.87–5.11)
RDW: 13.2 % (ref 11.5–15.5)
WBC: 5.6 10*3/uL (ref 4.0–10.5)

## 2013-01-07 LAB — BASIC METABOLIC PANEL
BUN: 18 mg/dL (ref 6–23)
Calcium: 9.9 mg/dL (ref 8.4–10.5)
GFR calc Af Amer: 63 mL/min — ABNORMAL LOW (ref >90)
GFR calc non Af Amer: 54 mL/min — ABNORMAL LOW (ref >90)
Glucose, Bld: 100 mg/dL — ABNORMAL HIGH (ref 70–99)
Potassium: 4.2 mEq/L (ref 3.5–5.1)
Sodium: 140 mEq/L (ref 135–145)

## 2013-01-07 LAB — SURGICAL PCR SCREEN
MRSA, PCR: NEGATIVE
Staphylococcus aureus: NEGATIVE

## 2013-01-07 NOTE — Progress Notes (Signed)
CXR results routed via EPIC fax to Dr Magnus Ivan

## 2013-01-13 ENCOUNTER — Ambulatory Visit: Payer: Medicare Other | Admitting: Internal Medicine

## 2013-01-14 ENCOUNTER — Encounter (HOSPITAL_COMMUNITY)
Admission: RE | Disposition: A | Discharge status: Home / Self Care | Payer: Self-pay | Source: Ambulatory Visit | Attending: Orthopaedic Surgery

## 2013-01-14 ENCOUNTER — Encounter (HOSPITAL_COMMUNITY): Payer: Self-pay | Admitting: *Deleted

## 2013-01-14 ENCOUNTER — Encounter (HOSPITAL_COMMUNITY): Payer: Self-pay | Admitting: Anesthesiology

## 2013-01-14 ENCOUNTER — Ambulatory Visit (HOSPITAL_COMMUNITY): Payer: Medicare Other | Admitting: Anesthesiology

## 2013-01-14 ENCOUNTER — Ambulatory Visit (HOSPITAL_COMMUNITY)
Admission: RE | Admit: 2013-01-14 | Discharge: 2013-01-14 | Disposition: A | Discharge status: Home / Self Care | Payer: Medicare Other | Source: Ambulatory Visit | Attending: Orthopaedic Surgery | Admitting: Orthopaedic Surgery

## 2013-01-14 DIAGNOSIS — M19019 Primary osteoarthritis, unspecified shoulder: Secondary | ICD-10-CM | POA: Insufficient documentation

## 2013-01-14 DIAGNOSIS — M75102 Unspecified rotator cuff tear or rupture of left shoulder, not specified as traumatic: Secondary | ICD-10-CM

## 2013-01-14 DIAGNOSIS — Z86718 Personal history of other venous thrombosis and embolism: Secondary | ICD-10-CM | POA: Insufficient documentation

## 2013-01-14 DIAGNOSIS — M67919 Unspecified disorder of synovium and tendon, unspecified shoulder: Secondary | ICD-10-CM | POA: Insufficient documentation

## 2013-01-14 DIAGNOSIS — I10 Essential (primary) hypertension: Secondary | ICD-10-CM | POA: Insufficient documentation

## 2013-01-14 DIAGNOSIS — M719 Bursopathy, unspecified: Secondary | ICD-10-CM | POA: Insufficient documentation

## 2013-01-14 DIAGNOSIS — R29898 Other symptoms and signs involving the musculoskeletal system: Secondary | ICD-10-CM | POA: Insufficient documentation

## 2013-01-14 DIAGNOSIS — M25819 Other specified joint disorders, unspecified shoulder: Secondary | ICD-10-CM | POA: Insufficient documentation

## 2013-01-14 HISTORY — PX: SHOULDER ARTHROSCOPY WITH ROTATOR CUFF REPAIR AND SUBACROMIAL DECOMPRESSION: SHX5686

## 2013-01-14 SURGERY — SHOULDER ARTHROSCOPY WITH ROTATOR CUFF REPAIR AND SUBACROMIAL DECOMPRESSION
Anesthesia: General | Site: Shoulder | Laterality: Left | Wound class: Clean

## 2013-01-14 MED ORDER — FENTANYL CITRATE 0.05 MG/ML IJ SOLN
100.0000 ug | Freq: Once | INTRAMUSCULAR | Status: AC
  Administered 2013-01-14: 100 ug via INTRAVENOUS

## 2013-01-14 MED ORDER — ROPIVACAINE HCL 5 MG/ML IJ SOLN
INTRAMUSCULAR | Status: DC | PRN
  Administered 2013-01-14: 30 mL

## 2013-01-14 MED ORDER — PHENYLEPHRINE HCL 10 MG/ML IJ SOLN
10.0000 mg | INTRAVENOUS | Status: DC | PRN
  Administered 2013-01-14: 50 ug/min via INTRAVENOUS

## 2013-01-14 MED ORDER — KETAMINE HCL 50 MG/ML IJ SOLN
INTRAMUSCULAR | Status: DC | PRN
  Administered 2013-01-14: 2 mg via INTRAVENOUS
  Administered 2013-01-14 (×2): 1 mg via INTRAMUSCULAR

## 2013-01-14 MED ORDER — FENTANYL CITRATE 0.05 MG/ML IJ SOLN
INTRAMUSCULAR | Status: AC
  Filled 2013-01-14: qty 2

## 2013-01-14 MED ORDER — ROCURONIUM BROMIDE 100 MG/10ML IV SOLN
INTRAVENOUS | Status: DC | PRN
  Administered 2013-01-14: 50 mg via INTRAVENOUS

## 2013-01-14 MED ORDER — ACETAMINOPHEN 10 MG/ML IV SOLN
INTRAVENOUS | Status: AC
  Filled 2013-01-14: qty 100

## 2013-01-14 MED ORDER — MIDAZOLAM HCL 2 MG/2ML IJ SOLN
INTRAMUSCULAR | Status: AC
  Filled 2013-01-14: qty 2

## 2013-01-14 MED ORDER — PROPOFOL 10 MG/ML IV BOLUS
INTRAVENOUS | Status: DC | PRN
  Administered 2013-01-14: 10 mg via INTRAVENOUS

## 2013-01-14 MED ORDER — EPINEPHRINE HCL 1 MG/ML IJ SOLN
INTRAMUSCULAR | Status: DC | PRN
  Administered 2013-01-14 (×2): 1 mg

## 2013-01-14 MED ORDER — NEOSTIGMINE METHYLSULFATE 1 MG/ML IJ SOLN
INTRAMUSCULAR | Status: DC | PRN
  Administered 2013-01-14: 5 mg via INTRAVENOUS

## 2013-01-14 MED ORDER — GLYCOPYRROLATE 0.2 MG/ML IJ SOLN
INTRAMUSCULAR | Status: DC | PRN
  Administered 2013-01-14: 0.6 mg via INTRAVENOUS

## 2013-01-14 MED ORDER — LACTATED RINGERS IV SOLN: INTRAVENOUS | Status: DC

## 2013-01-14 MED ORDER — EPINEPHRINE HCL 1 MG/ML IJ SOLN
INTRAMUSCULAR | Status: AC
  Filled 2013-01-14: qty 2

## 2013-01-14 MED ORDER — ONDANSETRON HCL 4 MG/2ML IJ SOLN
INTRAMUSCULAR | Status: DC | PRN
  Administered 2013-01-14: 4 mg via INTRAVENOUS

## 2013-01-14 MED ORDER — LACTATED RINGERS IR SOLN
Status: DC | PRN
  Administered 2013-01-14 (×4): 3000 mL

## 2013-01-14 MED ORDER — BUPIVACAINE-EPINEPHRINE (PF) 0.5% -1:200000 IJ SOLN
INTRAMUSCULAR | Status: AC
  Filled 2013-01-14: qty 10

## 2013-01-14 MED ORDER — CEFAZOLIN SODIUM-DEXTROSE 2-3 GM-% IV SOLR
2.0000 g | INTRAVENOUS | Status: AC
  Administered 2013-01-14: 2 g via INTRAVENOUS

## 2013-01-14 MED ORDER — MIDAZOLAM HCL 2 MG/2ML IJ SOLN
1.0000 mg | INTRAMUSCULAR | Status: DC | PRN
  Administered 2013-01-14: 2 mg via INTRAVENOUS

## 2013-01-14 MED ORDER — ROPIVACAINE HCL 5 MG/ML IJ SOLN
INTRAMUSCULAR | Status: AC
  Filled 2013-01-14: qty 30

## 2013-01-14 MED ORDER — HYDROMORPHONE HCL PF 1 MG/ML IJ SOLN: 0.2500 mg | INTRAMUSCULAR | Status: DC | PRN

## 2013-01-14 MED ORDER — LACTATED RINGERS IV SOLN
INTRAVENOUS | Status: DC
  Administered 2013-01-14: 14:00:00 via INTRAVENOUS
  Administered 2013-01-14: 1000 mL via INTRAVENOUS

## 2013-01-14 MED ORDER — HYDROMORPHONE HCL PF 1 MG/ML IJ SOLN
INTRAMUSCULAR | Status: DC | PRN
  Administered 2013-01-14 (×2): 1 mg via INTRAVENOUS

## 2013-01-14 MED ORDER — METOCLOPRAMIDE HCL 5 MG/ML IJ SOLN
INTRAMUSCULAR | Status: DC | PRN
  Administered 2013-01-14: 10 mg via INTRAVENOUS

## 2013-01-14 MED ORDER — CEFAZOLIN SODIUM-DEXTROSE 2-3 GM-% IV SOLR
INTRAVENOUS | Status: AC
  Filled 2013-01-14: qty 50

## 2013-01-14 MED ORDER — ACETAMINOPHEN 10 MG/ML IV SOLN
INTRAVENOUS | Status: DC | PRN
  Administered 2013-01-14: 1000 mg via INTRAVENOUS

## 2013-01-14 MED ORDER — HYDROCODONE-ACETAMINOPHEN 5-325 MG PO TABS: 1.0000 | ORAL_TABLET | ORAL | Status: DC | PRN

## 2013-01-14 SURGICAL SUPPLY — 41 items
BLADE CUTTER GATOR 3.5 (BLADE) ×1 IMPLANT
BLADE GREAT WHITE 4.2 (BLADE) ×1 IMPLANT
BLADE SURG SZ11 CARB STEEL (BLADE) ×2 IMPLANT
BUR OVAL 4.0 (BURR) ×2 IMPLANT
CANNULA ACUFO 5X76 (CANNULA) ×1 IMPLANT
CHLORAPREP W/TINT 26ML (MISCELLANEOUS) IMPLANT
CLOTH BEACON ORANGE TIMEOUT ST (SAFETY) ×2 IMPLANT
DECANTER SPIKE VIAL GLASS SM (MISCELLANEOUS) ×2 IMPLANT
DRAPE ORTHO SPLIT 77X108 STRL (DRAPES)
DRAPE SHOULDER BEACH CHAIR (DRAPES) ×1 IMPLANT
DRAPE SURG ORHT 6 SPLT 77X108 (DRAPES) IMPLANT
DRAPE U-SHAPE 47X51 STRL (DRAPES) ×2 IMPLANT
DRSG PAD ABDOMINAL 8X10 ST (GAUZE/BANDAGES/DRESSINGS) ×1 IMPLANT
DURAPREP 26ML APPLICATOR (WOUND CARE) ×2 IMPLANT
GAUZE XEROFORM 1X8 LF (GAUZE/BANDAGES/DRESSINGS) ×1 IMPLANT
GLOVE BIOGEL PI IND STRL 8 (GLOVE) ×1 IMPLANT
GLOVE BIOGEL PI INDICATOR 8 (GLOVE) ×1
GOWN PREVENTION PLUS LG XLONG (DISPOSABLE) ×2 IMPLANT
GOWN PREVENTION PLUS XLARGE (GOWN DISPOSABLE) ×1 IMPLANT
GOWN STRL REIN XL XLG (GOWN DISPOSABLE) ×2 IMPLANT
KIT BASIN OR (CUSTOM PROCEDURE TRAY) ×2 IMPLANT
KIT POSITION SHOULDER SCHLEI (MISCELLANEOUS) ×1 IMPLANT
KIT SHOULDER TRACTION (DRAPES) ×1 IMPLANT
MANIFOLD NEPTUNE II (INSTRUMENTS) ×4 IMPLANT
NDL SPNL 18GX3.5 QUINCKE PK (NEEDLE) ×1 IMPLANT
NEEDLE SPNL 18GX3.5 QUINCKE PK (NEEDLE) ×2 IMPLANT
NS IRRIG 1000ML POUR BTL (IV SOLUTION) ×1 IMPLANT
PACK SHOULDER CUSTOM OPM052 (CUSTOM PROCEDURE TRAY) ×2 IMPLANT
POSITIONER SURGICAL ARM (MISCELLANEOUS) ×2 IMPLANT
SET ARTHROSCOPY TUBING (MISCELLANEOUS) ×2
SET ARTHROSCOPY TUBING LN (MISCELLANEOUS) ×1 IMPLANT
SLING ARM IMMOBILIZER LRG (SOFTGOODS) IMPLANT
SLING ARM IMMOBILIZER MED (SOFTGOODS) ×2 IMPLANT
SPONGE GAUZE 4X4 12PLY (GAUZE/BANDAGES/DRESSINGS) ×1 IMPLANT
STRAP CHIN BCCS-OSI (MISCELLANEOUS) ×1 IMPLANT
SUT ETHILON 4 0 PS 2 18 (SUTURE) ×2 IMPLANT
SUT VIC AB 2-0 CT2 27 (SUTURE) IMPLANT
SUT VICRYL 0-0 OS 2 NEEDLE (SUTURE) ×1 IMPLANT
TAPE CLOTH SURG 4X10 WHT LF (GAUZE/BANDAGES/DRESSINGS) ×1 IMPLANT
TUBING CONNECTING 10 (TUBING) ×2 IMPLANT
WAND 90 DEG TURBOVAC W/CORD (SURGICAL WAND) ×2 IMPLANT

## 2013-01-14 NOTE — Anesthesia Postprocedure Evaluation (Signed)
  Anesthesia Post-op Note  Patient: Sarah [name]  Procedure(s) Performed: Procedure(s) (LRB): Left Shoulder Arthroscopy with Debridement, Subacromial Decompression,rotator cuff repair (Left)  Patient Location: PACU  Anesthesia Type: GA combined with regional for post-op pain  Level of Consciousness: awake and alert   Airway and Oxygen Therapy: Patient Spontanous Breathing  Post-op Pain: mild  Post-op Assessment: Post-op Vital signs reviewed, Patient's Cardiovascular Status Stable, Respiratory Function Stable, Patent Airway and No signs of Nausea or vomiting  Last Vitals:  Filed Vitals:   01/14/13 1530  BP: 168/61  Pulse: 82  Temp:   Resp: 17    Post-op Vital Signs: stable   Complications: No apparent anesthesia complications

## 2013-01-14 NOTE — Anesthesia Preprocedure Evaluation (Signed)
Anesthesia Evaluation  Patient identified by MRN, date of birth, ID band Patient awake    Reviewed: Allergy & Precautions, H&P , NPO status , Patient's Chart, lab work & pertinent test results, reviewed documented beta blocker date and time   Airway Mallampati: II TM Distance: >3 FB Neck ROM: full    Dental no notable dental hx. (+) Edentulous Upper, Missing, Dental Advisory Given, Teeth Intact and Caps Missing most of lower teeth and all that is left are caps:   Pulmonary shortness of breath,  breath sounds clear to auscultation  Pulmonary exam normal       Cardiovascular hypertension, Pt. on home beta blockers + Peripheral Vascular Disease Rhythm:regular Rate:Normal  Hx. tachycardia   Neuro/Psych negative neurological ROS  negative psych ROS   GI/Hepatic negative GI ROS, Neg liver ROS,   Endo/Other  negative endocrine ROS  Renal/GU negative Renal ROS  negative genitourinary   Musculoskeletal   Abdominal   Peds  Hematology negative hematology ROS (+)   Anesthesia Other Findings   Reproductive/Obstetrics negative OB ROS                           Anesthesia Physical Anesthesia Plan  ASA: III  Anesthesia Plan: General   Post-op Pain Management:    Induction: Intravenous  Airway Management Planned: Oral ETT  Additional Equipment:   Intra-op Plan:   Post-operative Plan: Extubation in OR  Informed Consent: I have reviewed the patients History and Physical, chart, labs and discussed the procedure including the risks, benefits and alternatives for the proposed anesthesia with the patient or authorized representative who has indicated his/her understanding and acceptance.   Dental Advisory Given  Plan Discussed with: CRNA and Surgeon  Anesthesia Plan Comments:         Anesthesia Quick Evaluation

## 2013-01-14 NOTE — Preoperative (Signed)
Beta Blockers   Reason not to administer Beta Blockers:Not Applicable, took BB this am 

## 2013-01-14 NOTE — H&P (Signed)
Sarah [name] is an 72 y.o. female.   Chief Complaint:   Severe left shoulder pain and weakness HPI:   72 yo female with a history of worsening left shoulder pain and weakness.  She has failed conservative treatment with PT, NSAIDs, and injections.  She wishes to proceed with a shoulder arthroscopy in an attempt to decrease her pain and improve her left shoulder function.  She understands the risks mainly of infection as well as continued weakness pending the nature of her tissues and what is found.  Past Medical History  Diagnosis Date  . Plantar fascial fibromatosis   . Personal history of unspecified circulatory disease   . Contact dermatitis and other eczema, due to unspecified cause   . History of sinusitis   . Hypertension   . Chronic back pain   . Hypertension   . Allergic rhinitis   . Dysrhythmia     hx of heart beating fast   . Shortness of breath     with exertion   . Arthritis   . Peripheral vascular disease     hx of DVT - left leg     Past Surgical History  Procedure Laterality Date  . History of lumbar fusion      '87  . Abdominal hysterectomy    . Foot surgery      No family history on file. Social History:  reports that she has never smoked. She has never used smokeless tobacco. She reports that she does not drink alcohol or use illicit drugs.  Allergies:  Allergies  Allergen Reactions  . Lisinopril     REACTION: Lightheaded "sick"    No prescriptions prior to admission    No results found for this or any previous visit (from the past 48 hour(s)). No results found.  Review of Systems  All other systems reviewed and are negative.    There were no vitals taken for this visit. Physical Exam  Constitutional: She is oriented to person, place, and time. She appears well-developed and well-nourished.  HENT:  Head: Normocephalic and atraumatic.  Eyes: EOM are normal. Pupils are equal, round, and reactive to light.  Neck: Normal range of motion. Neck  supple.  Cardiovascular: Normal rate and regular rhythm.   Respiratory: Effort normal and breath sounds normal.  GI: Soft. Bowel sounds are normal.  Musculoskeletal:       Left shoulder: She exhibits decreased range of motion, tenderness and crepitus.  Neurological: She is alert and oriented to person, place, and time.  Skin: Skin is warm and dry.  Psychiatric: She has a normal mood and affect.     Assessment/Plan Left shoulder with partial-thickness rotator cuff tear as well as OA and impingement syndrome 1) to the OR today for a left shoulder arthroscopy with debridement, subacromial decompression, and possible repair as necessary.  Kathryne Hitch 01/14/2013, 7:34 AM

## 2013-01-14 NOTE — Anesthesia Procedure Notes (Addendum)
Anesthesia Regional Block:  Interscalene brachial plexus block  Pre-Anesthetic Checklist: ,, timeout performed, Correct Patient, Correct Site, Correct Laterality, Correct Procedure, Correct Position, site marked, Risks and benefits discussed,  Surgical consent,  Pre-op evaluation,  At surgeon's request and post-op pain management  Laterality: Left  Prep: chloraprep       Needles:  Injection technique: Single-shot  Needle Type: Echogenic Stimulator Needle      Needle Gauge: 20 and 20 G    Additional Needles:  Procedures: ultrasound guided (picture in chart) Interscalene brachial plexus block  Nerve Stimulator or Paresthesia:  Response: 0.5 mA,   Additional Responses:   Narrative:  Start time: 01/14/2013 11:45 AM End time: 01/14/2013 11:55 AM Injection made incrementally with aspirations every 5 mL.  Performed by: Personally  Anesthesiologist: Gaetano Hawthorne MD  Additional Notes: Patient tolerated the procedure well without complications  Interscalene brachial plexus block

## 2013-01-14 NOTE — Transfer of Care (Signed)
Immediate Anesthesia Transfer of Care Note  Patient: MURIELLE STANG  Procedure(s) Performed: Procedure(s) with comments: Left Shoulder Arthroscopy with Debridement, Subacromial Decompression,rotator cuff repair (Left) - Left Shoulder Arthroscopy with Debridement, Subacromial Decompression  Patient Location: PACU  Anesthesia Type:General  Level of Consciousness: patient cooperative and responds to stimulation, drowsey, ventilating well  Airway & Oxygen Therapy: Patient Spontanous Breathing and Patient connected to face mask oxygen  Post-op Assessment: Report given to PACU RN, Post -op Vital signs reviewed and stable and Patient moving all extremities  Post vital signs: Reviewed and stable  Complications: No apparent anesthesia complications

## 2013-01-14 NOTE — Brief Op Note (Signed)
01/14/2013  2:51 PM  PATIENT:  Sarah [name]  72 y.o. female  PRE-OPERATIVE DIAGNOSIS:  Left shoulder arthritis and impingement syndrome  POST-OPERATIVE DIAGNOSIS:  Left shoulder arthritis and impingement syndrome  PROCEDURE:  Procedure(s) with comments: Left Shoulder Arthroscopy with Debridement, Subacromial Decompression,rotator cuff repair (Left) - Left Shoulder Arthroscopy with Debridement, Subacromial Decompression  SURGEON:  Surgeon(s) and Role:    * Kathryne Hitch, MD - Primary  PHYSICIAN ASSISTANT:   ASSISTANTS: none   ANESTHESIA:   regional and general  EBL:  Total I/O In: 1000 [I.V.:1000] Out: -   BLOOD ADMINISTERED:none  DRAINS: none   LOCAL MEDICATIONS USED:  NONE  SPECIMEN:  No Specimen  DISPOSITION OF SPECIMEN:  N/A  COUNTS:  YES  TOURNIQUET:  * No tourniquets in log *  DICTATION: .Other Dictation: Dictation Number [id number]  PLAN OF CARE: Discharge to home after PACU  PATIENT DISPOSITION:  PACU - hemodynamically stable.   Delay start of Pharmacological VTE agent (>24hrs) due to surgical blood loss or risk of bleeding: not applicable

## 2013-01-15 NOTE — Op Note (Signed)
[name], CID NO.:  [id number]  MEDICAL RECORD NO.:  [id number]  LOCATION:  [facility]                         FACILITY:  [facility]  PHYSICIAN:  Vanita Panda. 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  PROCEDURE:  Left shoulder arthroscopy with extensive debridement and subacromial decompression.  SURGEON:  Vanita Panda. Magnus Ivan, M.D.  ANESTHESIA: 1. Regional left shoulder block. 2. General.  ESTIMATED BLOOD LOSS:  Minimal.  COMPLICATIONS:  None.  INDICATIONS:  Sarah [name] is a very active 72 year old female with a left shoulder pain and weakness for some time now.  She was seen by another orthopedic surgeon who is centered in my way with an MRI that showed significant glenohumeral arthritis, at least partial-to-near full thickness tear of the rotator cuff, and significant AC joint arthropathy and bone spurs of the acromion with significantly decreased __________ suspect she has a component of rotator cuff arthropathy.  Her range of motion is limited and there is weakness.  I think that appropriate surgery at some point would likely be a shoulder arthroplasty, however, she wish to proceed with just an arthroscopic intervention and therapy, which I think is reasonable.  DESCRIPTION OF PROCEDURE:  After informed consent was obtained, appropriate left shoulder was marked.  Anesthesia obtained in regional shoulder block.  She was then brought to the operating room, placed supine on the  operating table.  General anesthesia was obtained.  She was then fashioned in a beach chair position with appropriate positioning of the head and neck and padding of the down on operative right arm.  There was bending at the waist and knees and palpable pulses in her feet.  Her head and neck positioning looked good.  Her left shoulder was then prepped and draped with DuraPrep and sterile drapes. Time-out was called to identify the correct patient.  Left shoulder was then placed shoulder in-line, skeletal traction using 10 pounds of skeletal traction with 45 degrees of forward flexion and neutral rotation.  Time-out was called again.  She was identified as correct patient, correct left shoulder.  I then made a posterolateral arthroscopy portal and entered the glenohumeral joint and found significant loose bodies and cartilage floating all around in the glenohumeral joint with a large area devoid of cartilage.  Also, had significant synovitis and a ruptured biceps tendon.  In the rotator interval, in the front of the shoulder, I made an arthroscopy portal and then used the arthroscopic shaver and soft tissue ablation wand and carried out extensive debridement in the glenohumeral joint to remove the loose bodies and loose cartilage.  I then entered the subacromial space of the posterior portal made a separate lateral portal.  I found some calcific tendinitis and there is a rotator cuff that had a full- thickness tear.  However, the cuff tissue  was not adequate for performing a rotator cuff repair.  I found significant bone spurring of the acromion and the glenohumeral joint, at the [facility] joint.  Using a high- speed bur, I cut up underneath the acromion, performed a partial acromioplasty, and removed some of foot distal clavicle which was quite difficult due to the positioning and the arthropathy.  I also used a soft tissue ablation wand in this area to continue removed first tissue and tissue  around the rotator cuff and inflamed tissue.  I then, I felt like we had developed adequate space and removed all instrumentation.  I closed the 3 portal sites with interrupted nylon suture.  Xeroform, a well-padded sterile dressing was applied.  The arm was placed in a sling.  She was awakened, extubated and taken to recovery room in stable condition.  All final counts were correct and no complications noted. Postoperatively, will go home in a sling with then coming out of sling as comfort allows.  Increasing motion of her shoulder.  We will see her back in the office in a week.     Vanita Panda. Magnus Ivan, M.D.     CYB/MEDQ  D:  01/14/2013  T:  01/15/2013  Job:  [id number]

## 2013-01-17 ENCOUNTER — Encounter (HOSPITAL_COMMUNITY): Payer: Self-pay | Admitting: Orthopaedic Surgery

## 2013-05-16 ENCOUNTER — Other Ambulatory Visit: Payer: Self-pay

## 2013-05-16 MED ORDER — LOSARTAN POTASSIUM 100 MG PO TABS: 100.0000 mg | ORAL_TABLET | Freq: Every day | ORAL | Status: DC

## 2013-06-07 ENCOUNTER — Telehealth: Payer: Self-pay | Admitting: *Deleted

## 2013-06-07 MED ORDER — ZOLPIDEM TARTRATE 10 MG PO TABS: 10.0000 mg | ORAL_TABLET | Freq: Every evening | ORAL | Status: DC | PRN

## 2013-06-07 MED ORDER — ESTROPIPATE 1.5 MG PO TABS: 0.7500 mg | ORAL_TABLET | Freq: Every day | ORAL | Status: DC

## 2013-06-07 MED ORDER — METOPROLOL TARTRATE 50 MG PO TABS: 50.0000 mg | ORAL_TABLET | Freq: Every day | ORAL | Status: DC

## 2013-06-07 MED ORDER — TRAMADOL HCL 50 MG PO TABS: 50.0000 mg | ORAL_TABLET | Freq: Four times a day (QID) | ORAL | Status: DC | PRN

## 2013-06-07 MED ORDER — HYDROCODONE-ACETAMINOPHEN 5-325 MG PO TABS: 1.0000 | ORAL_TABLET | ORAL | Status: DC | PRN

## 2013-06-07 MED ORDER — LOSARTAN POTASSIUM 100 MG PO TABS: 100.0000 mg | ORAL_TABLET | Freq: Every day | ORAL | Status: DC

## 2013-06-07 MED ORDER — DILTIAZEM HCL ER BEADS 300 MG PO CP24: 300.0000 mg | ORAL_CAPSULE | Freq: Every day | ORAL | Status: DC

## 2013-06-07 MED ORDER — ASPIRIN 81 MG PO TABS: 81.0000 mg | ORAL_TABLET | Freq: Every day | ORAL | Status: DC

## 2013-06-07 NOTE — Telephone Encounter (Signed)
Pt called requesting refills on Norco 5-325mg ; Tramadol 50mg , and Zolpidem 10mg .  Please advise

## 2013-06-07 NOTE — Telephone Encounter (Signed)
Called in medications as per MD message.

## 2013-06-07 NOTE — Telephone Encounter (Signed)
Ok for 1 month on each. Last OV in Jan '14 - needs OV for further refills

## 2013-06-17 ENCOUNTER — Telehealth: Payer: Self-pay | Admitting: Internal Medicine

## 2013-06-17 ENCOUNTER — Other Ambulatory Visit: Payer: Self-pay

## 2013-06-17 MED ORDER — ALLOPURINOL 300 MG PO TABS: 300.0000 mg | ORAL_TABLET | Freq: Every day | ORAL | Status: DC

## 2013-06-17 NOTE — Telephone Encounter (Signed)
Pt called request refill for Allopurinol to be send to [facility].

## 2013-06-17 NOTE — Telephone Encounter (Signed)
rx sent to [facility] on [address]

## 2013-08-09 ENCOUNTER — Encounter: Payer: Self-pay | Admitting: Internal Medicine

## 2013-08-09 ENCOUNTER — Other Ambulatory Visit (INDEPENDENT_AMBULATORY_CARE_PROVIDER_SITE_OTHER): Payer: Medicare Other

## 2013-08-09 ENCOUNTER — Ambulatory Visit (INDEPENDENT_AMBULATORY_CARE_PROVIDER_SITE_OTHER): Payer: Medicare Other | Admitting: Internal Medicine

## 2013-08-09 VITALS — BP 150/70 | HR 69 | Temp 98.1°F | Wt 158.4 lb

## 2013-08-09 DIAGNOSIS — I10 Essential (primary) hypertension: Secondary | ICD-10-CM

## 2013-08-09 DIAGNOSIS — Z23 Encounter for immunization: Secondary | ICD-10-CM

## 2013-08-09 DIAGNOSIS — Z0000 Encounter for general adult medical examination without abnormal findings: Secondary | ICD-10-CM

## 2013-08-09 DIAGNOSIS — E790 Hyperuricemia without signs of inflammatory arthritis and tophaceous disease: Secondary | ICD-10-CM

## 2013-08-09 DIAGNOSIS — R7989 Other specified abnormal findings of blood chemistry: Secondary | ICD-10-CM

## 2013-08-09 DIAGNOSIS — M549 Dorsalgia, unspecified: Secondary | ICD-10-CM

## 2013-08-09 LAB — URIC ACID: Uric Acid, Serum: 3.1 mg/dL (ref 2.4–7.0)

## 2013-08-09 LAB — COMPREHENSIVE METABOLIC PANEL
Albumin: 5 g/dL (ref 3.5–5.2)
BUN: 25 mg/dL — ABNORMAL HIGH (ref 6–23)
Calcium: 9.8 mg/dL (ref 8.4–10.5)
Chloride: 105 mEq/L (ref 96–112)
Creatinine, Ser: 1.3 mg/dL — ABNORMAL HIGH (ref 0.4–1.2)
GFR: 50.08 mL/min — ABNORMAL LOW (ref >60.00)
Glucose, Bld: 97 mg/dL (ref 70–99)
Potassium: 4.9 mEq/L (ref 3.5–5.1)

## 2013-08-09 MED ORDER — METOPROLOL TARTRATE 50 MG PO TABS: 50.0000 mg | ORAL_TABLET | Freq: Every day | ORAL | Status: DC

## 2013-08-09 MED ORDER — LOSARTAN POTASSIUM 100 MG PO TABS: 100.0000 mg | ORAL_TABLET | Freq: Every day | ORAL | Status: DC

## 2013-08-09 MED ORDER — ALLOPURINOL 300 MG PO TABS: 300.0000 mg | ORAL_TABLET | Freq: Every day | ORAL | Status: DC

## 2013-08-09 MED ORDER — DILTIAZEM HCL ER BEADS 300 MG PO CP24: 300.0000 mg | ORAL_CAPSULE | Freq: Every day | ORAL | Status: DC

## 2013-08-09 MED ORDER — ESTROPIPATE 1.5 MG PO TABS: 0.7500 mg | ORAL_TABLET | Freq: Every day | ORAL | Status: DC

## 2013-08-09 MED ORDER — ZOLPIDEM TARTRATE 10 MG PO TABS: 10.0000 mg | ORAL_TABLET | Freq: Every evening | ORAL | Status: DC | PRN

## 2013-08-09 MED ORDER — DICLOFENAC SODIUM 1 % TD GEL: 2.0000 g | Freq: Four times a day (QID) | TRANSDERMAL | Status: DC

## 2013-08-09 NOTE — Progress Notes (Signed)
Subjective:    Patient ID: Sarah [name], female    DOB: Jan 09, 1941, 72 y.o.   MRN: [id number]  HPI [name] presents for mid year follow up. She is having a lot pain in both shoulders. She has had arthroscopic surgery left and now is facing total joint replacement. Right shoulder also with pain and discomfort similar to the left.   Her BP today is a lttle high - at home it also runs in the 150 systolic range. She is on 3 agents: BB, ARB, CCB. She was taken off of diuretic because of gout.  She has not had a flare of gout. Last uric acid level 10.3 in March '13 and has since been started on allopurinol.   She has seen the eye doctor - early cataracts noted but no visual impairment. She is due to see the dentist. Last mammogram at Dr. Donnetta Hail office in May '14. She has not had colonoscopy.   Past Medical History  Diagnosis Date  . Plantar fascial fibromatosis   . Personal history of unspecified circulatory disease   . Contact dermatitis and other eczema, due to unspecified cause   . History of sinusitis   . Hypertension   . Chronic back pain   . Hypertension   . Allergic rhinitis   . Dysrhythmia     hx of heart beating fast   . Shortness of breath     with exertion   . Arthritis   . Peripheral vascular disease     hx of DVT - left leg    Past Surgical History  Procedure Laterality Date  . History of lumbar fusion      '87  . Abdominal hysterectomy    . Foot surgery    . Shoulder arthroscopy with rotator cuff repair and subacromial decompression Left 01/14/2013    Procedure: Left Shoulder Arthroscopy with Debridement, Subacromial Decompression,rotator cuff repair;  Surgeon: Kathryne Hitch, MD;  Location: [facility];  Service: Orthopedics;  Laterality: Left;  Left Shoulder Arthroscopy with Debridement, Subacromial Decompression   History reviewed. No pertinent family history. History   Social History  . Marital Status: Legally Separated    Spouse Name: N/A    Number of  Children: N/A  . Years of Education: N/A   Occupational History  . Not on file.   Social History Main Topics  . Smoking status: Never Smoker   . Smokeless tobacco: Never Used  . Alcohol Use: No  . Drug Use: No  . Sexual Activity: Not on file   Other Topics Concern  . Not on file   Social History Narrative   HSG. Married- '63. 1 daughter '64, 1 son '70,  2 grandchildren. Work: retired. Lives with husband and brother is in the home.    Current Outpatient Prescriptions on File Prior to Visit  Medication Sig Dispense Refill  . aspirin 81 MG tablet Take 1 tablet (81 mg total) by mouth daily.  30 tablet  6  . HYDROcodone-acetaminophen (NORCO) 5-325 MG per tablet Take 1-2 tablets by mouth every 4 (four) hours as needed for pain.  60 tablet  0  . traMADol (ULTRAM) 50 MG tablet Take 1 tablet (50 mg total) by mouth every 6 (six) hours as needed. Pain  60 tablet  0  . [DISCONTINUED] valsartan (DIOVAN) 160 MG tablet Take 1 tablet (160 mg total) by mouth daily.  90 tablet  1   No current facility-administered medications on file prior to visit.  Review of Systems System review is negative for any constitutional, cardiac, pulmonary, GI or neuro symptoms or complaints other than as described in the HPI.     Objective:   Physical Exam Filed Vitals:   08/09/13 1421  BP: 150/70  Pulse: 69  Temp: 98.1 F (36.7 C)   Gen'l WNWD woman who looks younger than her age HEENT- C&S clear Cor- 2+ radial RRR Pulm - normal respirations, lungs CTAP Ext - 100% RM right shoulder, 80% left  Recent Results (from the past 2160 hour(s))  COMPREHENSIVE METABOLIC PANEL     Status: Abnormal   Collection Time    08/09/13  3:36 PM      Result Value Range   Sodium 138  135 - 145 mEq/L   Potassium 4.9  3.5 - 5.1 mEq/L   Chloride 105  96 - 112 mEq/L   CO2 23  19 - 32 mEq/L   Glucose, Bld 97  70 - 99 mg/dL   BUN 25 (*) 6 - 23 mg/dL   Creatinine, Ser 1.3 (*) 0.4 - 1.2 mg/dL   Total Bilirubin 0.9   0.3 - 1.2 mg/dL   Alkaline Phosphatase 75  39 - 117 U/L   AST 27  0 - 37 U/L   ALT 16  0 - 35 U/L   Total Protein 8.1  6.0 - 8.3 g/dL   Albumin 5.0  3.5 - 5.2 g/dL   Calcium 9.8  8.4 - 96.0 mg/dL   GFR 45.40 (*) >98.11 mL/min  URIC ACID     Status: None   Collection Time    08/09/13  3:36 PM      Result Value Range   Uric Acid, Serum 3.1  2.4 - 7.0 mg/dL        Assessment & Plan:

## 2013-08-09 NOTE — Patient Instructions (Addendum)
1. Blood pressure - running a little high Plan  will check uric acid - if low will stop metoprolol and substitute a diuretic  2. Gout - no flares - good new Plan Uric acid level since you have been on allopurinol  3. Shoulder pain - antiinflammatories, ROM, steroid injections per ortho  4. Sleep - refill on ambien.  5. Health maintenance - you NEED to have colonoscopy - awake sedation not general anesthesia. You will have flu and pneumonia shot today. Please return for flu shot.   See you soon.

## 2013-08-11 ENCOUNTER — Telehealth: Payer: Self-pay

## 2013-08-11 MED ORDER — FUROSEMIDE 20 MG PO TABS: 20.0000 mg | ORAL_TABLET | Freq: Every day | ORAL | Status: DC

## 2013-08-11 NOTE — Assessment & Plan Note (Signed)
No gout flare. Uric acid level is down to 3.1 on allopurinol  Plan Continue present medication

## 2013-08-11 NOTE — Assessment & Plan Note (Signed)
Stable on present medication 

## 2013-08-11 NOTE — Assessment & Plan Note (Signed)
Encouraged her to consider colonoscopy - defers for now but will think about it.

## 2013-08-11 NOTE — Telephone Encounter (Signed)
Message copied by Noreene Larsson on Thu Aug 11, 2013  8:17 AM ------      Message from: Illene Regulus E      Created: Thu Aug 11, 2013  5:04 AM       Uric acid is low.      Plan - stop metoprolol                 Start furosemide - Rx sent to pharmacy            Thanks       ------

## 2013-08-11 NOTE — Assessment & Plan Note (Signed)
Blood pressure - running a little high Plan  will check uric acid - if low will stop metoprolol and substitute a diuretic  Addendum - Uric acid low  D/c metoprolol  Add Furosemide 20 mg daily

## 2013-08-11 NOTE — Telephone Encounter (Signed)
Patient aware to stop Metoprolol and start Furosemide.

## 2013-11-01 ENCOUNTER — Other Ambulatory Visit: Payer: Self-pay

## 2013-11-01 MED ORDER — ZOLPIDEM TARTRATE 10 MG PO TABS: 10.0000 mg | ORAL_TABLET | Freq: Every evening | ORAL | Status: DC | PRN

## 2013-11-02 MED ORDER — ZOLPIDEM TARTRATE 10 MG PO TABS: 10.0000 mg | ORAL_TABLET | Freq: Every evening | ORAL | Status: DC | PRN

## 2013-11-02 NOTE — Telephone Encounter (Signed)
Script faxed to [facility]

## 2013-11-02 NOTE — Addendum Note (Signed)
Addended by: Lyanne Co R on: 11/02/2013 08:16 AM   Modules accepted: Orders

## 2013-12-06 ENCOUNTER — Other Ambulatory Visit: Payer: Self-pay | Admitting: Internal Medicine

## 2014-01-19 LAB — HM DIABETES EYE EXAM

## 2014-02-06 ENCOUNTER — Other Ambulatory Visit: Payer: Self-pay

## 2014-02-06 MED ORDER — ZOLPIDEM TARTRATE 10 MG PO TABS: 10.0000 mg | ORAL_TABLET | Freq: Every evening | ORAL | Status: DC | PRN

## 2014-02-06 NOTE — Telephone Encounter (Signed)
Script for zolpidem to be faxed to [place] [facility]

## 2014-02-09 ENCOUNTER — Encounter: Payer: Self-pay | Admitting: Internal Medicine

## 2014-02-09 ENCOUNTER — Ambulatory Visit (INDEPENDENT_AMBULATORY_CARE_PROVIDER_SITE_OTHER): Payer: Medicare Other | Admitting: Internal Medicine

## 2014-02-09 VITALS — BP 190/82 | HR 110 | Temp 97.7°F | Wt 155.8 lb

## 2014-02-09 DIAGNOSIS — I10 Essential (primary) hypertension: Secondary | ICD-10-CM

## 2014-02-09 DIAGNOSIS — Z23 Encounter for immunization: Secondary | ICD-10-CM

## 2014-02-09 DIAGNOSIS — Z2911 Encounter for prophylactic immunotherapy for respiratory syncytial virus (RSV): Secondary | ICD-10-CM

## 2014-02-09 MED ORDER — METOPROLOL TARTRATE 50 MG PO TABS: 50.0000 mg | ORAL_TABLET | Freq: Every day | ORAL | Status: DC

## 2014-02-09 NOTE — Progress Notes (Signed)
Pre visit review using our clinic review tool, if applicable. No additional management support is needed unless otherwise documented below in the visit note. 

## 2014-02-09 NOTE — Progress Notes (Signed)
Subjective:    Patient ID: Sarah [name], female    DOB: [birth year], 73 y.o.   MRN: [id number]  HPI Mrs. Fernando presents for routine follow up. She has  Been doing fair. She has left arm pain - she is trying to avoid left shoulder repair.   BP is elevated today. Reviewed vital flow sheet - the majority of the time her BP has been controlled. Reviewed chart - at September visit she had elevated systolic pressure - the plan was to check Uric acid and if controlled to start furosemide and drop BB.   General health maintenance is up to date.  Past Medical History  Diagnosis Date  . Plantar fascial fibromatosis   . Personal history of unspecified circulatory disease   . Contact dermatitis and other eczema, due to unspecified cause   . History of sinusitis   . Hypertension   . Chronic back pain   . Hypertension   . Allergic rhinitis   . Dysrhythmia     hx of heart beating fast   . Shortness of breath     with exertion   . Arthritis   . Peripheral vascular disease     hx of DVT - left leg    Past Surgical History  Procedure Laterality Date  . History of lumbar fusion      '87  . Abdominal hysterectomy    . Foot surgery    . Shoulder arthroscopy with rotator cuff repair and subacromial decompression Left 01/14/2013    Procedure: Left Shoulder Arthroscopy with Debridement, Subacromial Decompression,rotator cuff repair;  Surgeon: Mcarthur Rossetti, MD;  Location: [facility];  Service: Orthopedics;  Laterality: Left;  Left Shoulder Arthroscopy with Debridement, Subacromial Decompression   History reviewed. No pertinent family history. History   Social History  . Marital Status: Legally Separated    Spouse Name: N/A    Number of Children: N/A  . Years of Education: N/A   Occupational History  . Not on file.   Social History Main Topics  . Smoking status: Never Smoker   . Smokeless tobacco: Never Used  . Alcohol Use: No  . Drug Use: No  . Sexual Activity: Not on file    Other Topics Concern  . Not on file   Social History Narrative   HSG. Married- '63. 1 daughter '64, 1 son '70,  2 grandchildren. Work: retired. Lives with husband and brother is in the home.    Current Outpatient Prescriptions on File Prior to Visit  Medication Sig Dispense Refill  . allopurinol (ZYLOPRIM) 300 MG tablet Take 1 tablet (300 mg total) by mouth daily.  90 tablet  3  . aspirin 81 MG tablet Take 1 tablet (81 mg total) by mouth daily.  30 tablet  6  . diclofenac sodium (VOLTAREN) 1 % GEL Apply 2 g topically 4 (four) times daily.  100 g  5  . diltiazem (TIAZAC) 300 MG 24 hr capsule Take 1 capsule (300 mg total) by mouth daily before breakfast.  90 capsule  3  . estropipate (OGEN) 1.5 MG tablet Take 0.5 tablets (0.75 mg total) by mouth daily. Takes 1/2 tablet  15 tablet  3  . furosemide (LASIX) 20 MG tablet TAKE 1 TABLET (20 MG TOTAL) BY MOUTH DAILY.  30 tablet  5  . losartan (COZAAR) 100 MG tablet Take 1 tablet (100 mg total) by mouth daily after lunch.  90 tablet  3  . traMADol (ULTRAM) 50 MG tablet Take 1  tablet (50 mg total) by mouth every 6 (six) hours as needed. Pain  60 tablet  0  . zolpidem (AMBIEN) 10 MG tablet Take 1 tablet (10 mg total) by mouth at bedtime as needed.  90 tablet  0  . [DISCONTINUED] valsartan (DIOVAN) 160 MG tablet Take 1 tablet (160 mg total) by mouth daily.  90 tablet  1   No current facility-administered medications on file prior to visit.     Review of Systems System review is negative for any constitutional, cardiac, pulmonary, GI or neuro symptoms or complaints other than as described in the HPI.     Objective:   Physical Exam Filed Vitals:   02/09/14 0933  BP: 190/82  Pulse: 110  Temp: 97.7 F (36.5 C)   Gen'l - WNWD woman in no distress, looking younger than her age 15- C&S clear Cor - 2+ radial pulse, Regular tachycardia without mm. Pulm - CTAP Neuro - A&O x 3       Assessment & Plan:

## 2014-02-09 NOTE — Patient Instructions (Signed)
Thank you for being my patient over the years.  You appear to be doing well except for that shoulder.  Blood pressure - too high today. In September we stopped the metoprolol and started furosemide for better control but it appears that we need to add back the metoprolol at the same dose. Please check your blood pressure several times over the next 7-10 days after restarting your metoprolol and let me know.  Immunizations - you need a tetanus shot and you should have the shingles vaccine today.  Moving forward you will be able to see Dr. Ronnald Ramp.

## 2014-02-11 NOTE — Assessment & Plan Note (Signed)
Blood pressure - too high today.   In September we stopped the metoprolol and started furosemide for better control but it appears that we need to add back the metoprolol at the same dose.  Plan Restart metoprolol at previous dose  Please check your blood pressure several times over the next 7-10 days after restarting your metoprolol and let me know.

## 2014-05-01 ENCOUNTER — Telehealth: Payer: Self-pay

## 2014-05-01 MED ORDER — ZOLPIDEM TARTRATE 10 MG PO TABS: 10.0000 mg | ORAL_TABLET | Freq: Every evening | ORAL | Status: DC | PRN

## 2014-05-01 NOTE — Telephone Encounter (Signed)
Rx printed and mailed

## 2014-05-01 NOTE — Telephone Encounter (Signed)
Received refill request from [facility]  request refills for zolpidem 10 mg . Rx last written 02/06/14 #90/0rf and pt last seen 02/09/14 ([facility])  . Please advise, pt transferring to [facility] Thanks

## 2014-05-01 NOTE — Telephone Encounter (Signed)
ok 

## 2014-05-15 ENCOUNTER — Telehealth: Payer: Self-pay

## 2014-05-15 NOTE — Telephone Encounter (Signed)
Pt's PA for Sarah [name] was denied, they saw no medical need for pt to take med and it increases the risks for falls and injury for the senior citizens. FYI

## 2014-05-24 ENCOUNTER — Encounter: Payer: Self-pay | Admitting: Internal Medicine

## 2014-05-29 ENCOUNTER — Other Ambulatory Visit: Payer: Self-pay

## 2014-05-29 MED ORDER — FUROSEMIDE 20 MG PO TABS: ORAL_TABLET | ORAL | Status: DC

## 2014-07-04 LAB — HM MAMMOGRAPHY: HM Mammogram: NORMAL

## 2014-07-12 LAB — HM PAP SMEAR: HM Pap smear: NORMAL

## 2014-07-20 ENCOUNTER — Encounter: Payer: Self-pay | Admitting: *Deleted

## 2014-07-20 NOTE — Progress Notes (Signed)
Patient ID: Sarah [name], female   DOB: 1941/02/25, 73 y.o.   MRN: [id number]  Pt arrived to her PV and is very nervous.  She is tearful she is so nervous.  She states, "I'm 44- is there any other things I can have done to check my colon besides a colonoscopy.  I just really do not want to do this."  I explained the Cologuard test and she is interested in doing this.  She has no family hx of colon CA, no pain, bleeding or changes in bowel habits.  She states she is also nervous about the anesthesia and I explained that [facility] uses Propofol.  She wishes to have an OV with Sarah [name] first.  He doesn't have any new pt appt until October, so I offered for her to see an extender.  She states she wishes to see Sarah [name].  Appt made for 09-21-14 at 1:45 p.m.

## 2014-07-24 ENCOUNTER — Encounter: Payer: Self-pay | Admitting: Internal Medicine

## 2014-07-24 ENCOUNTER — Telehealth: Payer: Self-pay | Admitting: Internal Medicine

## 2014-07-24 ENCOUNTER — Ambulatory Visit (INDEPENDENT_AMBULATORY_CARE_PROVIDER_SITE_OTHER): Payer: Medicare Other | Admitting: Internal Medicine

## 2014-07-24 VITALS — BP 152/86 | HR 78 | Temp 98.2°F | Wt 148.5 lb

## 2014-07-24 DIAGNOSIS — M67919 Unspecified disorder of synovium and tendon, unspecified shoulder: Secondary | ICD-10-CM

## 2014-07-24 DIAGNOSIS — M25572 Pain in left ankle and joints of left foot: Secondary | ICD-10-CM

## 2014-07-24 DIAGNOSIS — I10 Essential (primary) hypertension: Secondary | ICD-10-CM

## 2014-07-24 DIAGNOSIS — E790 Hyperuricemia without signs of inflammatory arthritis and tophaceous disease: Secondary | ICD-10-CM

## 2014-07-24 DIAGNOSIS — R7989 Other specified abnormal findings of blood chemistry: Secondary | ICD-10-CM

## 2014-07-24 DIAGNOSIS — M75102 Unspecified rotator cuff tear or rupture of left shoulder, not specified as traumatic: Secondary | ICD-10-CM

## 2014-07-24 DIAGNOSIS — L570 Actinic keratosis: Secondary | ICD-10-CM

## 2014-07-24 DIAGNOSIS — M25579 Pain in unspecified ankle and joints of unspecified foot: Secondary | ICD-10-CM

## 2014-07-24 DIAGNOSIS — M719 Bursopathy, unspecified: Secondary | ICD-10-CM

## 2014-07-24 DIAGNOSIS — L851 Acquired keratosis [keratoderma] palmaris et plantaris: Secondary | ICD-10-CM

## 2014-07-24 MED ORDER — TRAMADOL HCL 50 MG PO TABS: 50.0000 mg | ORAL_TABLET | Freq: Three times a day (TID) | ORAL | Status: DC | PRN

## 2014-07-24 NOTE — Patient Instructions (Addendum)
Your next office appointment will be determined based upon review of your pending labs. Those instructions will be transmitted to you through My Chart   Followup as needed for your acute issue. Please report any significant change in your symptoms.Use an anti-inflammatory cream such as Aspercreme or Zostrix cream twice a day to the affected area as needed. In lieu of this warm moist compresses or  hot water bottle can be used. Do not apply ice .Minimal Blood Pressure Goal= AVERAGE < 140/90;  Ideal is an AVERAGE < 135/85. This AVERAGE should be calculated from @ least 5-7 BP readings taken @ different times of day on different days of week. You should not respond to isolated BP readings , but rather the AVERAGE for that week .Please bring your  blood pressure cuff to office visits to verify that it is reliable.It  can also be checked against the blood pressure device at the pharmacy. Finger or wrist cuffs are not dependable; an arm cuff is.

## 2014-07-24 NOTE — Telephone Encounter (Signed)
Relevant patient education mailed to patient.  

## 2014-07-24 NOTE — Progress Notes (Signed)
   Subjective:    Patient ID: Sarah [name], female    DOB: [birth year], 73 y.o.   MRN: [id number]  HPI    She has had intermittent pain in both ankles over the last 1-2 months. This is greater in the left lateral malleolar area than on the right. The pain is now constant and never completely resolves. It is worse with standing.  She feels allopurinol which she takes for hyperuricemia and Tylenol help.  She has a new skin lesion over the left lateral ankle area.  She's had some associated weakness and numbness particularly in the left lower extremity laterally. She is not monitoring blood pressure at home.On low salt & no fried foods.No CVE.  Ortho treating shoulder impingement syndrome.  Creatinine 1.3 on 08/09/13     Review of Systems  She questions abnormal bruising.  She specifically denies joint swelling or stiffness.  She has no classic podagra.  She also denies fever, chills, sweats, change in weight  She has no myalgias.  There's been no loss of control of bladder or bowels.       Objective:   Physical Exam Positive or pertinent findings include: She appears much younger than her stated age. She has arcus senilis bilaterally. She has an upper plate & lower partial.She was embarrassed to have dental evaluation. There is dramatic decreased range of motion left shoulder with no elevation above the shoulder level. There is a 15 x 15 bland, black sclerotic lesion over the left lateral ankle.  General appearance :adequately nourished; in no distress. Eyes: No conjunctival inflammation or scleral icterus is present. Oral exam:  Lips and gums are healthy appearing.There is no oropharyngeal erythema or exudate noted.  Heart:  Normal rate and regular rhythm. S1 and S2 normal without gallop, murmur, click, or rub .S4 Lungs:Chest clear to auscultation; no wheezes, rhonchi,rales ,or rubs present.No increased work of breathing.  Abdomen: Wearing 2 separate support garments;  bowel sounds normal, soft and non-tender without masses, organomegaly or hernias noted. No AAA. No guarding or rebound.  Skin:Warm & dry.  Intact without suspicious lesions or rashes ; no jaundice or tenting Lymphatic: No lymphadenopathy is noted about the head, neck, axilla         Assessment & Plan:  #1 bilateral ankle pain, rule out gout  #2 hypertension, suboptimal control. Risk and options discussed  #3 renal insufficiency  #4 hyperuricemia. Risk (CV & renal) associated with this was discussed.  #5 ? Keratosis L foot. Derm referral offered  See orders and recommendations.

## 2014-07-24 NOTE — Progress Notes (Signed)
Pre visit review using our clinic review tool, if applicable. No additional management support is needed unless otherwise documented below in the visit note. 

## 2014-07-25 ENCOUNTER — Other Ambulatory Visit (INDEPENDENT_AMBULATORY_CARE_PROVIDER_SITE_OTHER): Payer: Medicare Other

## 2014-07-25 DIAGNOSIS — M25572 Pain in left ankle and joints of left foot: Secondary | ICD-10-CM

## 2014-07-25 DIAGNOSIS — R7989 Other specified abnormal findings of blood chemistry: Secondary | ICD-10-CM

## 2014-07-25 DIAGNOSIS — I10 Essential (primary) hypertension: Secondary | ICD-10-CM

## 2014-07-25 DIAGNOSIS — E790 Hyperuricemia without signs of inflammatory arthritis and tophaceous disease: Secondary | ICD-10-CM

## 2014-07-25 DIAGNOSIS — M25579 Pain in unspecified ankle and joints of unspecified foot: Secondary | ICD-10-CM

## 2014-07-25 LAB — BASIC METABOLIC PANEL
BUN: 30 mg/dL — AB (ref 6–23)
CHLORIDE: 103 meq/L (ref 96–112)
CO2: 27 meq/L (ref 19–32)
CREATININE: 1.3 mg/dL — AB (ref 0.4–1.2)
Calcium: 10.1 mg/dL (ref 8.4–10.5)
GFR: 50.82 mL/min — ABNORMAL LOW (ref >60.00)
Glucose, Bld: 101 mg/dL — ABNORMAL HIGH (ref 70–99)
Potassium: 4.1 mEq/L (ref 3.5–5.1)
Sodium: 139 mEq/L (ref 135–145)

## 2014-07-25 LAB — URIC ACID: Uric Acid, Serum: 6.1 mg/dL (ref 2.4–7.0)

## 2014-07-25 LAB — SEDIMENTATION RATE: Sed Rate: 33 mm/hr — ABNORMAL HIGH (ref 0–22)

## 2014-08-03 ENCOUNTER — Encounter: Payer: Medicare Other | Admitting: Internal Medicine

## 2014-08-03 ENCOUNTER — Other Ambulatory Visit: Payer: Self-pay

## 2014-08-03 MED ORDER — DILTIAZEM HCL ER BEADS 300 MG PO CP24: 300.0000 mg | ORAL_CAPSULE | Freq: Every day | ORAL | Status: DC

## 2014-08-14 ENCOUNTER — Ambulatory Visit (INDEPENDENT_AMBULATORY_CARE_PROVIDER_SITE_OTHER): Payer: Medicare Other | Admitting: Internal Medicine

## 2014-08-14 ENCOUNTER — Other Ambulatory Visit (INDEPENDENT_AMBULATORY_CARE_PROVIDER_SITE_OTHER): Payer: Medicare Other

## 2014-08-14 ENCOUNTER — Encounter: Payer: Self-pay | Admitting: Internal Medicine

## 2014-08-14 ENCOUNTER — Ambulatory Visit (INDEPENDENT_AMBULATORY_CARE_PROVIDER_SITE_OTHER)
Admission: RE | Admit: 2014-08-14 | Discharge: 2014-08-14 | Disposition: A | Discharge status: Home / Self Care | Payer: Medicare Other | Source: Ambulatory Visit | Attending: Internal Medicine | Admitting: Internal Medicine

## 2014-08-14 VITALS — BP 160/84 | HR 72 | Temp 98.5°F | Resp 16 | Ht 63.0 in | Wt 156.0 lb

## 2014-08-14 DIAGNOSIS — E790 Hyperuricemia without signs of inflammatory arthritis and tophaceous disease: Secondary | ICD-10-CM

## 2014-08-14 DIAGNOSIS — S79929A Unspecified injury of unspecified thigh, initial encounter: Secondary | ICD-10-CM | POA: Insufficient documentation

## 2014-08-14 DIAGNOSIS — E1129 Type 2 diabetes mellitus with other diabetic kidney complication: Secondary | ICD-10-CM

## 2014-08-14 DIAGNOSIS — E785 Hyperlipidemia, unspecified: Secondary | ICD-10-CM

## 2014-08-14 DIAGNOSIS — I10 Essential (primary) hypertension: Secondary | ICD-10-CM

## 2014-08-14 DIAGNOSIS — M549 Dorsalgia, unspecified: Secondary | ICD-10-CM

## 2014-08-14 DIAGNOSIS — S79919A Unspecified injury of unspecified hip, initial encounter: Secondary | ICD-10-CM

## 2014-08-14 DIAGNOSIS — Z23 Encounter for immunization: Secondary | ICD-10-CM

## 2014-08-14 DIAGNOSIS — E1165 Type 2 diabetes mellitus with hyperglycemia: Principal | ICD-10-CM

## 2014-08-14 DIAGNOSIS — E118 Type 2 diabetes mellitus with unspecified complications: Secondary | ICD-10-CM | POA: Insufficient documentation

## 2014-08-14 DIAGNOSIS — R7989 Other specified abnormal findings of blood chemistry: Secondary | ICD-10-CM

## 2014-08-14 DIAGNOSIS — S79921A Unspecified injury of right thigh, initial encounter: Secondary | ICD-10-CM

## 2014-08-14 DIAGNOSIS — Z0000 Encounter for general adult medical examination without abnormal findings: Secondary | ICD-10-CM

## 2014-08-14 LAB — URINALYSIS, ROUTINE W REFLEX MICROSCOPIC
Bilirubin Urine: NEGATIVE
Hgb urine dipstick: NEGATIVE
KETONES UR: NEGATIVE
Leukocytes, UA: NEGATIVE
Nitrite: NEGATIVE
PH: 5.5 (ref 5.0–8.0)
RBC / HPF: NONE SEEN (ref 0–?)
SPECIFIC GRAVITY, URINE: 1.015 (ref 1.000–1.030)
TOTAL PROTEIN, URINE-UPE24: NEGATIVE
URINE GLUCOSE: NEGATIVE
Urobilinogen, UA: 0.2 (ref 0.0–1.0)

## 2014-08-14 LAB — CBC WITH DIFFERENTIAL/PLATELET
BASOS PCT: 0.2 % (ref 0.0–3.0)
Basophils Absolute: 0 10*3/uL (ref 0.0–0.1)
EOS PCT: 1.8 % (ref 0.0–5.0)
Eosinophils Absolute: 0.1 10*3/uL (ref 0.0–0.7)
HCT: 36.5 % (ref 36.0–46.0)
HEMOGLOBIN: 12.1 g/dL (ref 12.0–15.0)
LYMPHS PCT: 36.8 % (ref 12.0–46.0)
Lymphs Abs: 2.1 10*3/uL (ref 0.7–4.0)
MCHC: 33.1 g/dL (ref 30.0–36.0)
MCV: 92.2 fl (ref 78.0–100.0)
Monocytes Absolute: 0.3 10*3/uL (ref 0.1–1.0)
Monocytes Relative: 6 % (ref 3.0–12.0)
NEUTROS ABS: 3.2 10*3/uL (ref 1.4–7.7)
Neutrophils Relative %: 55.2 % (ref 43.0–77.0)
Platelets: 257 10*3/uL (ref 150.0–400.0)
RBC: 3.97 Mil/uL (ref 3.87–5.11)
RDW: 13.9 % (ref 11.5–15.5)
WBC: 5.8 10*3/uL (ref 4.0–10.5)

## 2014-08-14 LAB — TSH: TSH: 1.62 u[IU]/mL (ref 0.35–4.50)

## 2014-08-14 LAB — LIPID PANEL
Cholesterol: 170 mg/dL (ref 0–200)
HDL: 54.7 mg/dL (ref >39.00)
LDL CALC: 104 mg/dL — AB (ref 0–99)
NonHDL: 115.3
TRIGLYCERIDES: 58 mg/dL (ref 0.0–149.0)
Total CHOL/HDL Ratio: 3
VLDL: 11.6 mg/dL (ref 0.0–40.0)

## 2014-08-14 LAB — COMPREHENSIVE METABOLIC PANEL
ALBUMIN: 4.7 g/dL (ref 3.5–5.2)
ALT: 19 U/L (ref 0–35)
AST: 24 U/L (ref 0–37)
Alkaline Phosphatase: 83 U/L (ref 39–117)
BUN: 25 mg/dL — ABNORMAL HIGH (ref 6–23)
CALCIUM: 10.1 mg/dL (ref 8.4–10.5)
CHLORIDE: 105 meq/L (ref 96–112)
CO2: 24 mEq/L (ref 19–32)
Creatinine, Ser: 1.6 mg/dL — ABNORMAL HIGH (ref 0.4–1.2)
GFR: 42.21 mL/min — AB (ref >60.00)
GLUCOSE: 102 mg/dL — AB (ref 70–99)
POTASSIUM: 4.1 meq/L (ref 3.5–5.1)
Sodium: 142 mEq/L (ref 135–145)
Total Bilirubin: 0.4 mg/dL (ref 0.2–1.2)
Total Protein: 8.2 g/dL (ref 6.0–8.3)

## 2014-08-14 LAB — HEMOGLOBIN A1C: HEMOGLOBIN A1C: 6.5 % (ref 4.6–6.5)

## 2014-08-14 LAB — URIC ACID: URIC ACID, SERUM: 8.3 mg/dL — AB (ref 2.4–7.0)

## 2014-08-14 MED ORDER — CHLORTHALIDONE 25 MG PO TABS: 25.0000 mg | ORAL_TABLET | Freq: Every day | ORAL | Status: DC

## 2014-08-14 NOTE — Progress Notes (Signed)
Pre visit review using our clinic review tool, if applicable. No additional management support is needed unless otherwise documented below in the visit note. 

## 2014-08-14 NOTE — Patient Instructions (Signed)

## 2014-08-14 NOTE — Progress Notes (Signed)
Subjective:    Patient ID: Sarah [name], female    DOB: February 14, 1941, 73 y.o.   MRN: [id number]  Hypertension This is a chronic problem. The current episode started more than 1 year ago. The problem has been gradually worsening since onset. The problem is uncontrolled. Pertinent negatives include no anxiety, blurred vision, chest pain, headaches, malaise/fatigue, neck pain, orthopnea, palpitations, peripheral edema, PND, shortness of breath or sweats. Agents associated with hypertension include estrogens and NSAIDs. Past treatments include diuretics, beta blockers, angiotensin blockers and calcium channel blockers. The current treatment provides moderate improvement. Compliance problems include exercise and diet.  Hypertensive end-organ damage includes kidney disease. Identifiable causes of hypertension include chronic renal disease.      Review of Systems  Constitutional: Negative.  Negative for fever, chills, malaise/fatigue, diaphoresis, appetite change and fatigue.  HENT: Negative.   Eyes: Negative.  Negative for blurred vision.  Respiratory: Negative.  Negative for apnea, cough, choking, chest tightness, shortness of breath, wheezing and stridor.   Cardiovascular: Negative.  Negative for chest pain, palpitations, orthopnea, leg swelling and PND.  Gastrointestinal: Negative.  Negative for nausea, vomiting, abdominal pain, diarrhea, constipation and blood in stool.  Endocrine: Negative.  Negative for polydipsia, polyphagia and polyuria.  Genitourinary: Negative.   Musculoskeletal: Positive for arthralgias. Negative for back pain, gait problem, joint swelling, myalgias, neck pain and neck stiffness.       She has had a bruise with some pain over her right posterior thigh for one month, she does not recall an injury but remains concerned about a "hard place" back there.  Skin: Negative.  Negative for rash.  Allergic/Immunologic: Negative.   Neurological: Negative.  Negative for dizziness,  tremors, seizures, syncope, facial asymmetry, speech difficulty, weakness, light-headedness, numbness and headaches.  Hematological: Negative.  Negative for adenopathy. Does not bruise/bleed easily.  Psychiatric/Behavioral: Negative.        Objective:   Physical Exam  Vitals reviewed. Constitutional: She is oriented to person, place, and time. She appears well-developed and well-nourished. No distress.  HENT:  Head: Normocephalic and atraumatic.  Mouth/Throat: Oropharynx is clear and moist. No oropharyngeal exudate.  Eyes: Conjunctivae are normal. Right eye exhibits no discharge. Left eye exhibits no discharge. No scleral icterus.  Neck: Normal range of motion. Neck supple. No JVD present. No tracheal deviation present. No thyromegaly present.  Cardiovascular: Normal rate, regular rhythm, normal heart sounds and intact distal pulses.  Exam reveals no gallop and no friction rub.   No murmur heard. Pulmonary/Chest: Effort normal and breath sounds normal. No stridor. No respiratory distress. She has no wheezes. She has no rales. She exhibits no tenderness.  Abdominal: Soft. Bowel sounds are normal. She exhibits no distension and no mass. There is no tenderness. There is no rebound and no guarding.  Musculoskeletal: Normal range of motion. She exhibits no edema and no tenderness.       Right upper leg: She exhibits deformity. She exhibits no tenderness, no bony tenderness, no swelling, no edema and no laceration.       Legs: Lymphadenopathy:    She has no cervical adenopathy.  Neurological: She is oriented to person, place, and time.  Skin: Skin is warm and dry. No rash noted. She is not diaphoretic. No erythema. No pallor.  Psychiatric: She has a normal mood and affect. Her behavior is normal. Judgment and thought content normal.      Lab Results  Component Value Date   WBC 5.6 01/07/2013   HGB 12.2 01/07/2013  HCT 36.6 01/07/2013   PLT 246 01/07/2013   GLUCOSE 101* 07/25/2014   CHOL 161  09/09/2011   TRIG 57.0 09/09/2011   HDL 56.40 09/09/2011   LDLCALC 93 09/09/2011   ALT 16 08/09/2013   AST 27 08/09/2013   NA 139 07/25/2014   K 4.1 07/25/2014   CL 103 07/25/2014   CREATININE 1.3* 07/25/2014   BUN 30* 07/25/2014   CO2 27 07/25/2014   TSH 1.77 09/09/2011   HGBA1C 6.5 09/09/2011      Assessment & Plan:

## 2014-08-15 MED ORDER — ATORVASTATIN CALCIUM 10 MG PO TABS: 10.0000 mg | ORAL_TABLET | Freq: Every day | ORAL | Status: DC

## 2014-08-15 NOTE — Assessment & Plan Note (Signed)
Her blood sugars are well controlled Will cont to monitor her renal function

## 2014-08-15 NOTE — Assessment & Plan Note (Signed)
Plain film is normal This is a contusion  I have offered her reassurance

## 2014-08-15 NOTE — Assessment & Plan Note (Signed)

## 2014-08-15 NOTE — Assessment & Plan Note (Addendum)
Her BP is not well controlled, will change lasix to chlorthalidone for better BP control Her lytes are stable and renal function has declined slightly in a pre-renal pattern associated with diuretic therapy, will follow for now

## 2014-08-15 NOTE — Assessment & Plan Note (Signed)
Her LDL is slightly elevated so I have asked her to start a statin to lower her CV risk

## 2014-08-16 ENCOUNTER — Telehealth: Payer: Self-pay

## 2014-08-16 NOTE — Telephone Encounter (Signed)
Pt informed of results.

## 2014-08-21 ENCOUNTER — Encounter: Payer: Self-pay | Admitting: Internal Medicine

## 2014-09-21 ENCOUNTER — Ambulatory Visit (INDEPENDENT_AMBULATORY_CARE_PROVIDER_SITE_OTHER): Payer: Self-pay | Admitting: Internal Medicine

## 2014-09-21 ENCOUNTER — Encounter: Payer: Self-pay | Admitting: Internal Medicine

## 2014-09-21 VITALS — BP 170/60 | HR 84 | Ht 62.75 in | Wt 156.4 lb

## 2014-09-21 DIAGNOSIS — Z1211 Encounter for screening for malignant neoplasm of colon: Secondary | ICD-10-CM

## 2014-09-21 NOTE — Patient Instructions (Addendum)
You have been scheduled for a colonoscopy. Please follow written instructions given to you at your visit today.  Please pick up your prep supplies at the pharmacy. If you use inhalers (even only as needed), please bring them with you on the day of your procedure.   I appreciate the opportunity to care for you.  

## 2014-09-22 NOTE — Progress Notes (Signed)
   Subjective:    Patient ID: Sarah [name], female    DOB: 03-29-1941, 73 y.o.   MRN: [id number]  HPI This is a very nice lady here with her daughter to discuss colon cancer screening. She was unsure about colonoscopy at a previsit. Since then she thinkjs she will have a colonoscopy. No active GI complaints. Medications, allergies, past medical history, past surgical history, family history and social history are reviewed and updated in the EMR.  Review of Systems As above    Objective:   Physical Exam General:  NAD Eyes:   anicteric Lungs:  clear Heart:  S1S2 no rubs, murmurs or gallops Abdomen:  soft and nontender, BS+ Ext:   no edema     Assessment & Plan:  Colon cancer screening - Plan: Ambulatory referral to Gastroenterology  Will schedule screening colonoscopy after review of colonoscopy, CT colonoscopy, cologuard. All risks benefits and indications of these tests were reviewed in detail and all ? Answered.  I appreciate the opportunity to care for this patient. Gatha Mayer, MD, [facility]

## 2014-09-27 ENCOUNTER — Encounter: Payer: Self-pay | Admitting: Internal Medicine

## 2014-10-12 ENCOUNTER — Encounter: Payer: Self-pay | Admitting: Internal Medicine

## 2014-10-12 ENCOUNTER — Ambulatory Visit (AMBULATORY_SURGERY_CENTER): Payer: Medicare Other | Admitting: Internal Medicine

## 2014-10-12 VITALS — BP 156/87 | HR 76 | Temp 98.2°F | Resp 24 | Ht 62.0 in | Wt 156.0 lb

## 2014-10-12 DIAGNOSIS — Z1211 Encounter for screening for malignant neoplasm of colon: Secondary | ICD-10-CM

## 2014-10-12 DIAGNOSIS — D124 Benign neoplasm of descending colon: Secondary | ICD-10-CM

## 2014-10-12 MED ORDER — SODIUM CHLORIDE 0.9 % IV SOLN: 500.0000 mL | INTRAVENOUS | Status: DC

## 2014-10-12 NOTE — Progress Notes (Signed)
Called to room to assist during endoscopic procedure.  Patient ID and intended procedure confirmed with present staff. Received instructions for my participation in the procedure from the performing physician.  

## 2014-10-12 NOTE — Op Note (Addendum)
[place]  [address] [place], [address]   COLONOSCOPY PROCEDURE REPORT  [name], [name]  MR#: [id number] BIRTHDATE: December 31, 1940 , 73  yrs. old GENDER: female ENDOSCOPIST: Gatha Mayer, MD, [facility] PROCEDURE DATE:  10/12/2014 PROCEDURE:   Colonoscopy with biopsy and Colonoscopy with snare polypectomy First Screening Colonoscopy - Avg.  risk and is 50 yrs.  old or older Yes.  Prior Negative Screening - Now for repeat screening. N/A  History of Adenoma - Now for follow-up colonoscopy & has been > or = to 3 yrs.  N/A  Polyps Removed Today? Yes. ASA CLASS:   Class III INDICATIONS:average risk for colorectal cancer and first colonoscopy. MEDICATIONS: Propofol 210 mg IV and Monitored anesthesia care  DESCRIPTION OF PROCEDURE:   After Sarah risks benefits and alternatives of Sarah procedure were thoroughly explained, informed consent was obtained.  Sarah digital rectal exam revealed no abnormalities of Sarah rectum.   Sarah LB [id number]  endoscope was introduced through Sarah anus and advanced to Sarah cecum, which was identified by both Sarah appendix and ileocecal valve. No adverse events experienced.   Sarah quality of Sarah prep was good, using MiraLax  Sarah instrument was then slowly withdrawn as Sarah colon was fully examined.   COLON FINDINGS: Two sessile polyps ranging from 2 to 74mm in size were found in Sarah descending colon.  Polypectomies were performed with a cold snare (78mm) and with cold forceps (60mm).  Sarah resection was complete, Sarah polyp tissue was completely retrieved and sent to histology.   There was diverticulosis noted in Sarah sigmoid colon. Sarah examination was otherwise normal.  Retroflexed views revealed no abnormalities. Sarah time to cecum=2 minutes 15 seconds. Withdrawal time=8 minutes 34 seconds.  Sarah scope was withdrawn and Sarah procedure completed. COMPLICATIONS: There were no immediate complications.  ENDOSCOPIC IMPRESSION: 1.   Two sessile  polyps ranging from 2 to 80mm in size were found in Sarah descending colon; polypectomies were performed with a cold snare and with cold forceps 2.   Diverticulosis was noted in Sarah sigmoid colon 3.   Sarah examination was otherwise normal - good prep  RECOMMENDATIONS: 1.  Await pathology results 2.  Doubt she will need another routine colonoscopy.  eSigned:  Gatha Mayer, MD, [facility] 10/12/2014 4:36 PM Revised: 10/12/2014 4:36 PM  cc: Sarah [name], Dory Horn, MD and Scarlette Calico, MD

## 2014-10-12 NOTE — Patient Instructions (Addendum)
I found and removed 2 tiny polyps. They looked benign - please do not worry about these. You also have a condition called diverticulosis - common and not usually a problem. Please read the handout provided.  I doubt you will need to have another routine colonoscopy. I will send you a letter about the polyp pathology findings with recommendations.  I appreciate the opportunity to care for you. Gatha Mayer, MD, FACG  YOU HAD AN ENDOSCOPIC PROCEDURE TODAY AT [facility]: Refer to the procedure report that was given to you for any specific questions about what was found during the examination.  If the procedure report does not answer your questions, please call your gastroenterologist to clarify.  If you requested that your care partner not be given the details of your procedure findings, then the procedure report has been included in a sealed envelope for you to review at your convenience later.  YOU SHOULD EXPECT: Some feelings of bloating in the abdomen. Passage of more gas than usual.  Walking can help get rid of the air that was put into your GI tract during the procedure and reduce the bloating. If you had a lower endoscopy (such as a colonoscopy or flexible sigmoidoscopy) you may notice spotting of blood in your stool or on the toilet paper. If you underwent a bowel prep for your procedure, then you may not have a normal bowel movement for a few days.  DIET: Your first meal following the procedure should be a light meal and then it is ok to progress to your normal diet.  A half-sandwich or bowl of soup is an example of a good first meal.  Heavy or fried foods are harder to digest and may make you feel nauseous or bloated.  Likewise meals heavy in dairy and vegetables can cause extra gas to form and this can also increase the bloating.  Drink plenty of fluids but you should avoid alcoholic beverages for 24 hours.  ACTIVITY: Your care partner should take you home  directly after the procedure.  You should plan to take it easy, moving slowly for the rest of the day.  You can resume normal activity the day after the procedure however you should NOT DRIVE or use heavy machinery for 24 hours (because of the sedation medicines used during the test).    SYMPTOMS TO REPORT IMMEDIATELY: A gastroenterologist can be reached at any hour.  During normal business hours, 8:30 AM to 5:00 PM Monday through Friday, call [phone].  After hours and on weekends, please call the GI answering service at [phone] who will take a message and have the physician on call contact you.   Following lower endoscopy (colonoscopy or flexible sigmoidoscopy):  Excessive amounts of blood in the stool  Significant tenderness or worsening of abdominal pains  Swelling of the abdomen that is new, acute  Fever of 100F or higher  FOLLOW UP: If any biopsies were taken you will be contacted by phone or by letter within the next 1-3 weeks.  Call your gastroenterologist if you have not heard about the biopsies in 3 weeks.  Our staff will call the home number listed on your records the next business day following your procedure to check on you and address any questions or concerns that you may have at that time regarding the information given to you following your procedure. This is a courtesy call and so if there is no answer at the home number and  we have not heard from you through the emergency physician on call, we will assume that you have returned to your regular daily activities without incident.  SIGNATURES/CONFIDENTIALITY: You and/or your care partner have signed paperwork which will be entered into your electronic medical record.  These signatures attest to the fact that that the information above on your After Visit Summary has been reviewed and is understood.  Full responsibility of the confidentiality of this discharge information lies with you and/or your care-partner.  Read  all handouts given to you by your recovery room nurse.

## 2014-10-12 NOTE — Progress Notes (Signed)
Report to PACU, RN, vss, BBS= Clear.  

## 2014-10-13 ENCOUNTER — Telehealth: Payer: Self-pay

## 2014-10-13 NOTE — Telephone Encounter (Signed)
  Follow up Call-  Call back number 10/12/2014  Post procedure Call Back phone  # [phone]  Permission to leave phone message Yes     Patient questions:  Do you have a fever, pain , or abdominal swelling? No. Pain Score  0 *  Have you tolerated food without any problems? Yes.    Have you been able to return to your normal activities? Yes.    Do you have any questions about your discharge instructions: Diet   No. Medications  No. Follow up visit  No.  Do you have questions or concerns about your Care? No.  Actions: * If pain score is 4 or above: No action needed, pain <4.

## 2014-10-17 ENCOUNTER — Ambulatory Visit (INDEPENDENT_AMBULATORY_CARE_PROVIDER_SITE_OTHER): Payer: Medicare Other | Admitting: Internal Medicine

## 2014-10-17 ENCOUNTER — Encounter: Payer: Self-pay | Admitting: Internal Medicine

## 2014-10-17 VITALS — BP 164/80 | HR 69 | Temp 98.2°F | Resp 16 | Ht 63.0 in | Wt 154.0 lb

## 2014-10-17 DIAGNOSIS — Z86010 Personal history of colonic polyps: Secondary | ICD-10-CM | POA: Insufficient documentation

## 2014-10-17 DIAGNOSIS — E118 Type 2 diabetes mellitus with unspecified complications: Secondary | ICD-10-CM

## 2014-10-17 DIAGNOSIS — Z860101 Personal history of adenomatous and serrated colon polyps: Secondary | ICD-10-CM

## 2014-10-17 DIAGNOSIS — I10 Essential (primary) hypertension: Secondary | ICD-10-CM

## 2014-10-17 HISTORY — DX: Personal history of adenomatous and serrated colon polyps: Z86.0101

## 2014-10-17 HISTORY — DX: Personal history of colonic polyps: Z86.010

## 2014-10-17 MED ORDER — HYDROCHLOROTHIAZIDE 12.5 MG PO CAPS: 12.5000 mg | ORAL_CAPSULE | Freq: Every day | ORAL | Status: DC

## 2014-10-17 NOTE — Assessment & Plan Note (Signed)
Her A1C has been well controlled No meds are needed at this time

## 2014-10-17 NOTE — Assessment & Plan Note (Signed)
Her BP is not well controlled, she has not been taking chlorthalidone Will try HCTZ, will cont metoprolol She agrees to work on her lifestyle modifications

## 2014-10-17 NOTE — Progress Notes (Signed)
   Subjective:    Patient ID: Sarah [name], female    DOB: 08-06-1941, 73 y.o.   MRN: [id number]  Hypertension This is a chronic problem. The problem has been gradually worsening since onset. The problem is uncontrolled. Pertinent negatives include no anxiety, blurred vision, chest pain, headaches, malaise/fatigue, neck pain, orthopnea, palpitations, peripheral edema, PND, shortness of breath or sweats. There are no associated agents to hypertension. Past treatments include beta blockers and diuretics. Compliance problems include diet, exercise and medication cost (she complains that chlorthalidone is too expensive).  Hypertensive end-organ damage includes kidney disease. Identifiable causes of hypertension include chronic renal disease.      Review of Systems  Constitutional: Negative.  Negative for fever, chills, malaise/fatigue, diaphoresis, appetite change and fatigue.  HENT: Negative.   Eyes: Negative.  Negative for blurred vision.  Respiratory: Negative.  Negative for apnea, cough, choking, chest tightness, shortness of breath, wheezing and stridor.   Cardiovascular: Negative.  Negative for chest pain, palpitations, orthopnea, leg swelling and PND.  Gastrointestinal: Negative.  Negative for nausea, vomiting, abdominal pain, diarrhea, constipation and blood in stool.  Endocrine: Negative.   Genitourinary: Negative.  Negative for dysuria, urgency, frequency, difficulty urinating and dyspareunia.  Musculoskeletal: Negative.  Negative for back pain, joint swelling, arthralgias and neck pain.  Skin: Negative.  Negative for rash.  Allergic/Immunologic: Negative.   Neurological: Negative.  Negative for dizziness, tremors, weakness, light-headedness and headaches.  Hematological: Negative.  Negative for adenopathy. Does not bruise/bleed easily.  Psychiatric/Behavioral: Negative.        Objective:   Physical Exam  Constitutional: She is oriented to person, place, and time. She appears  well-developed and well-nourished. No distress.  HENT:  Head: Normocephalic and atraumatic.  Mouth/Throat: Oropharynx is clear and moist. No oropharyngeal exudate.  Eyes: Conjunctivae are normal. Right eye exhibits no discharge. Left eye exhibits no discharge. No scleral icterus.  Neck: Normal range of motion. Neck supple. No JVD present. No tracheal deviation present. No thyromegaly present.  Cardiovascular: Normal rate, regular rhythm, normal heart sounds and intact distal pulses.  Exam reveals no gallop and no friction rub.   No murmur heard. Pulmonary/Chest: Effort normal and breath sounds normal. No stridor. No respiratory distress. She has no wheezes. She has no rales. She exhibits no tenderness.  Abdominal: Soft. Bowel sounds are normal. She exhibits no distension and no mass. There is no tenderness. There is no rebound and no guarding.  Musculoskeletal: Normal range of motion. She exhibits no edema or tenderness.  Lymphadenopathy:    She has no cervical adenopathy.  Neurological: She is oriented to person, place, and time.  Skin: Skin is warm and dry. No rash noted. She is not diaphoretic. No erythema. No pallor.  Vitals reviewed.    Lab Results  Component Value Date   WBC 5.8 08/14/2014   HGB 12.1 08/14/2014   HCT 36.5 08/14/2014   PLT 257.0 08/14/2014   GLUCOSE 102* 08/14/2014   CHOL 170 08/14/2014   TRIG 58.0 08/14/2014   HDL 54.70 08/14/2014   LDLCALC 104* 08/14/2014   ALT 19 08/14/2014   AST 24 08/14/2014   NA 142 08/14/2014   K 4.1 08/14/2014   CL 105 08/14/2014   CREATININE 1.6* 08/14/2014   BUN 25* 08/14/2014   CO2 24 08/14/2014   TSH 1.62 08/14/2014   HGBA1C 6.5 08/14/2014       Assessment & Plan:

## 2014-10-17 NOTE — Progress Notes (Signed)
Pre visit review using our clinic review tool, if applicable. No additional management support is needed unless otherwise documented below in the visit note. 

## 2014-10-17 NOTE — Progress Notes (Signed)
Quick Note:  2 diminutive adenomas Consider repeating colonoscopy 2020 ______

## 2014-10-19 LAB — HM COLONOSCOPY: HM Colonoscopy: 2

## 2014-10-19 NOTE — Addendum Note (Signed)
Addended by: [name] on: 10/19/2014 10:12 AM   Modules accepted: Miquel Dunn

## 2014-10-31 ENCOUNTER — Other Ambulatory Visit: Payer: Self-pay

## 2014-10-31 MED ORDER — METOPROLOL TARTRATE 50 MG PO TABS: 50.0000 mg | ORAL_TABLET | Freq: Every day | ORAL | Status: DC

## 2014-11-14 ENCOUNTER — Other Ambulatory Visit: Payer: Self-pay | Admitting: *Deleted

## 2014-11-14 MED ORDER — METOPROLOL TARTRATE 50 MG PO TABS: 50.0000 mg | ORAL_TABLET | Freq: Every day | ORAL | Status: DC

## 2014-11-21 ENCOUNTER — Other Ambulatory Visit: Payer: Self-pay | Admitting: Geriatric Medicine

## 2014-11-21 MED ORDER — LOSARTAN POTASSIUM 100 MG PO TABS: 100.0000 mg | ORAL_TABLET | Freq: Every day | ORAL | Status: DC

## 2014-11-22 ENCOUNTER — Ambulatory Visit: Payer: Medicare Other | Admitting: Internal Medicine

## 2014-11-22 ENCOUNTER — Telehealth: Payer: Self-pay | Admitting: Internal Medicine

## 2014-11-22 ENCOUNTER — Other Ambulatory Visit: Payer: Self-pay | Admitting: Geriatric Medicine

## 2014-11-22 MED ORDER — METOPROLOL TARTRATE 50 MG PO TABS: 50.0000 mg | ORAL_TABLET | Freq: Every day | ORAL | Status: DC

## 2014-11-22 MED ORDER — LOSARTAN POTASSIUM 100 MG PO TABS: 100.0000 mg | ORAL_TABLET | Freq: Every day | ORAL | Status: DC

## 2014-11-22 NOTE — Telephone Encounter (Signed)
Refill done.  

## 2014-11-22 NOTE — Telephone Encounter (Signed)
Patient is requesting metoprolol to be sent to [facility]

## 2014-12-18 ENCOUNTER — Other Ambulatory Visit (INDEPENDENT_AMBULATORY_CARE_PROVIDER_SITE_OTHER): Payer: Medicare Other

## 2014-12-18 ENCOUNTER — Encounter: Payer: Self-pay | Admitting: Internal Medicine

## 2014-12-18 ENCOUNTER — Ambulatory Visit (INDEPENDENT_AMBULATORY_CARE_PROVIDER_SITE_OTHER): Payer: Medicare Other | Admitting: Internal Medicine

## 2014-12-18 VITALS — BP 132/78 | HR 76 | Temp 97.8°F | Resp 16 | Ht 63.0 in

## 2014-12-18 DIAGNOSIS — Z23 Encounter for immunization: Secondary | ICD-10-CM

## 2014-12-18 DIAGNOSIS — I10 Essential (primary) hypertension: Secondary | ICD-10-CM

## 2014-12-18 DIAGNOSIS — E118 Type 2 diabetes mellitus with unspecified complications: Secondary | ICD-10-CM

## 2014-12-18 LAB — BASIC METABOLIC PANEL
BUN: 33 mg/dL — AB (ref 6–23)
CO2: 22 meq/L (ref 19–32)
Calcium: 10.2 mg/dL (ref 8.4–10.5)
Chloride: 108 mEq/L (ref 96–112)
Creatinine, Ser: 1.32 mg/dL — ABNORMAL HIGH (ref 0.40–1.20)
GFR: 50.76 mL/min — ABNORMAL LOW (ref >60.00)
Glucose, Bld: 103 mg/dL — ABNORMAL HIGH (ref 70–99)
POTASSIUM: 4.6 meq/L (ref 3.5–5.1)
SODIUM: 141 meq/L (ref 135–145)

## 2014-12-18 LAB — URINALYSIS, ROUTINE W REFLEX MICROSCOPIC
Bilirubin Urine: NEGATIVE
HGB URINE DIPSTICK: NEGATIVE
Ketones, ur: NEGATIVE
LEUKOCYTES UA: NEGATIVE
Nitrite: NEGATIVE
RBC / HPF: NONE SEEN (ref 0–?)
Specific Gravity, Urine: 1.015 (ref 1.000–1.030)
Total Protein, Urine: NEGATIVE
Urine Glucose: NEGATIVE
Urobilinogen, UA: 0.2 (ref 0.0–1.0)
pH: 6 (ref 5.0–8.0)

## 2014-12-18 LAB — MICROALBUMIN / CREATININE URINE RATIO
CREATININE, U: 141 mg/dL
MICROALB UR: 0.6 mg/dL (ref 0.0–1.9)
Microalb Creat Ratio: 0.4 mg/g (ref 0.0–30.0)

## 2014-12-18 LAB — HEMOGLOBIN A1C: HEMOGLOBIN A1C: 6.5 % (ref 4.6–6.5)

## 2014-12-18 MED ORDER — ZOLPIDEM TARTRATE 10 MG PO TABS: 10.0000 mg | ORAL_TABLET | Freq: Every evening | ORAL | Status: DC | PRN

## 2014-12-18 NOTE — Progress Notes (Signed)
   Subjective:    Patient ID: Sarah [name], female    DOB: [birth year], 74 y.o.   MRN: [id number]  Hypertension This is a chronic problem. The current episode started more than 1 year ago. The problem has been gradually improving since onset. The problem is controlled. Pertinent negatives include no chest pain, neck pain, palpitations or shortness of breath. Past treatments include beta blockers, angiotensin blockers, diuretics and calcium channel blockers. The current treatment provides significant improvement. Compliance problems include exercise.  Hypertensive end-organ damage includes kidney disease. Identifiable causes of hypertension include chronic renal disease.      Review of Systems  Constitutional: Negative.  Negative for fever, chills, diaphoresis, appetite change and fatigue.  HENT: Negative.   Eyes: Negative.   Respiratory: Negative.  Negative for cough, choking, chest tightness, shortness of breath and stridor.   Cardiovascular: Negative.  Negative for chest pain, palpitations and leg swelling.  Gastrointestinal: Negative.  Negative for nausea, abdominal pain, diarrhea, constipation and blood in stool.  Endocrine: Negative.  Negative for polydipsia, polyphagia and polyuria.  Genitourinary: Negative.   Musculoskeletal: Negative.  Negative for myalgias, back pain, arthralgias and neck pain.  Skin: Negative.  Negative for rash.  Allergic/Immunologic: Negative.   Neurological: Negative.  Negative for dizziness, syncope, speech difficulty and light-headedness.  Hematological: Negative.  Negative for adenopathy. Does not bruise/bleed easily.  Psychiatric/Behavioral: Negative.        Objective:   Physical Exam  Constitutional: She is oriented to person, place, and time. She appears well-developed and well-nourished. No distress.  HENT:  Head: Normocephalic and atraumatic.  Mouth/Throat: Oropharynx is clear and moist. No oropharyngeal exudate.  Eyes: Conjunctivae are normal.  Right eye exhibits no discharge. Left eye exhibits no discharge. No scleral icterus.  Neck: Normal range of motion. Neck supple. No JVD present. No tracheal deviation present. No thyromegaly present.  Cardiovascular: Normal rate, regular rhythm, normal heart sounds and intact distal pulses.  Exam reveals no gallop and no friction rub.   No murmur heard. Pulmonary/Chest: Effort normal and breath sounds normal. No stridor. No respiratory distress. She has no wheezes. She has no rales. She exhibits no tenderness.  Abdominal: Soft. Bowel sounds are normal. She exhibits no distension and no mass. There is no tenderness. There is no rebound and no guarding.  Musculoskeletal: Normal range of motion. She exhibits no edema or tenderness.  Lymphadenopathy:    She has no cervical adenopathy.  Neurological: She is oriented to person, place, and time.  Skin: Skin is warm and dry. No rash noted. She is not diaphoretic. No erythema. No pallor.  Vitals reviewed.    Lab Results  Component Value Date   WBC 5.8 08/14/2014   HGB 12.1 08/14/2014   HCT 36.5 08/14/2014   PLT 257.0 08/14/2014   GLUCOSE 102* 08/14/2014   CHOL 170 08/14/2014   TRIG 58.0 08/14/2014   HDL 54.70 08/14/2014   LDLCALC 104* 08/14/2014   ALT 19 08/14/2014   AST 24 08/14/2014   NA 142 08/14/2014   K 4.1 08/14/2014   CL 105 08/14/2014   CREATININE 1.6* 08/14/2014   BUN 25* 08/14/2014   CO2 24 08/14/2014   TSH 1.62 08/14/2014   HGBA1C 6.5 08/14/2014       Assessment & Plan:

## 2014-12-18 NOTE — Progress Notes (Signed)
Pre visit review using our clinic review tool, if applicable. No additional management support is needed unless otherwise documented below in the visit note. 

## 2014-12-18 NOTE — Patient Instructions (Signed)

## 2014-12-18 NOTE — Assessment & Plan Note (Signed)
Her BP is well controlled Her lytes and renal function are stable 

## 2014-12-18 NOTE — Assessment & Plan Note (Signed)
Her blood sugars are well controlled She will cont lifestyle modifications

## 2015-04-07 ENCOUNTER — Other Ambulatory Visit: Payer: Self-pay | Admitting: Internal Medicine

## 2015-04-19 ENCOUNTER — Telehealth: Payer: Self-pay | Admitting: Internal Medicine

## 2015-04-19 ENCOUNTER — Ambulatory Visit (INDEPENDENT_AMBULATORY_CARE_PROVIDER_SITE_OTHER): Payer: Medicare Other | Admitting: Internal Medicine

## 2015-04-19 ENCOUNTER — Encounter: Payer: Self-pay | Admitting: Internal Medicine

## 2015-04-19 VITALS — BP 142/62 | HR 71 | Temp 98.3°F | Resp 16 | Ht 63.0 in | Wt 149.2 lb

## 2015-04-19 DIAGNOSIS — Z23 Encounter for immunization: Secondary | ICD-10-CM

## 2015-04-19 DIAGNOSIS — J301 Allergic rhinitis due to pollen: Secondary | ICD-10-CM | POA: Diagnosis not present

## 2015-04-19 DIAGNOSIS — M549 Dorsalgia, unspecified: Secondary | ICD-10-CM

## 2015-04-19 DIAGNOSIS — I10 Essential (primary) hypertension: Secondary | ICD-10-CM

## 2015-04-19 MED ORDER — DILTIAZEM HCL ER BEADS 300 MG PO CP24: 300.0000 mg | ORAL_CAPSULE | Freq: Every day | ORAL | Status: DC

## 2015-04-19 MED ORDER — HYDROCHLOROTHIAZIDE 12.5 MG PO CAPS: ORAL_CAPSULE | ORAL | Status: DC

## 2015-04-19 MED ORDER — METOPROLOL TARTRATE 50 MG PO TABS: 50.0000 mg | ORAL_TABLET | Freq: Every day | ORAL | Status: DC

## 2015-04-19 MED ORDER — METHYLPREDNISOLONE 4 MG PO TBPK: ORAL_TABLET | ORAL | Status: DC

## 2015-04-19 MED ORDER — DICLOFENAC SODIUM 1 % TD GEL: 2.0000 g | Freq: Four times a day (QID) | TRANSDERMAL | Status: DC

## 2015-04-19 MED ORDER — LEVOCETIRIZINE DIHYDROCHLORIDE 5 MG PO TABS: 5.0000 mg | ORAL_TABLET | Freq: Every evening | ORAL | Status: DC

## 2015-04-19 MED ORDER — ZOLPIDEM TARTRATE 10 MG PO TABS: 10.0000 mg | ORAL_TABLET | Freq: Every evening | ORAL | Status: DC | PRN

## 2015-04-19 MED ORDER — MOMETASONE FUROATE 50 MCG/ACT NA SUSP: 4.0000 | Freq: Every day | NASAL | Status: DC

## 2015-04-19 NOTE — Telephone Encounter (Signed)
Patient states scripts that were sent today to [facility]

## 2015-04-19 NOTE — Progress Notes (Signed)
Pre visit review using our clinic review tool, if applicable. No additional management support is needed unless otherwise documented below in the visit note. 

## 2015-04-19 NOTE — Progress Notes (Signed)
Subjective:  Patient ID: Sarah [name], female    DOB: 1941/03/25  Age: 74 y.o. MRN: [id number]  CC: Allergic Rhinitis  and Hypertension   HPI NATALEY BAHRI presents for 5 month history of left-sided nasal congestion and right -sided clear nasal phlegm with intermittent sneezing, she has not treated this in any way.  Outpatient Prescriptions Prior to Visit  Medication Sig Dispense Refill  . aspirin 81 MG tablet Take 1 tablet (81 mg total) by mouth daily. 30 tablet 6  . atorvastatin (LIPITOR) 10 MG tablet Take 1 tablet (10 mg total) by mouth daily. 90 tablet 3  . estropipate (OGEN) 1.5 MG tablet Take 0.5 tablets (0.75 mg total) by mouth daily. Takes 1/2 tablet 15 tablet 3  . losartan (COZAAR) 100 MG tablet Take 1 tablet (100 mg total) by mouth daily after lunch. 90 tablet 3  . traMADol (ULTRAM) 50 MG tablet Take 1 tablet (50 mg total) by mouth every 8 (eight) hours as needed. 30 tablet 0  . diclofenac sodium (VOLTAREN) 1 % GEL Apply 2 g topically 4 (four) times daily. 100 g 5  . diltiazem (TIAZAC) 300 MG 24 hr capsule Take 1 capsule (300 mg total) by mouth daily before breakfast. 90 capsule 3  . hydrochlorothiazide (MICROZIDE) 12.5 MG capsule TAKE 1 CAPSULE (12.5 MG TOTAL) BY MOUTH DAILY. 90 capsule 1  . metoprolol (LOPRESSOR) 50 MG tablet Take 1 tablet (50 mg total) by mouth daily. 90 tablet 3  . zolpidem (AMBIEN) 10 MG tablet Take 1 tablet (10 mg total) by mouth at bedtime as needed. 90 tablet 1  . allopurinol (ZYLOPRIM) 300 MG tablet Take 1 tablet (300 mg total) by mouth daily. 90 tablet 3   No facility-administered medications prior to visit.    ROS Review of Systems  Constitutional: Negative.  Negative for fever, chills, diaphoresis, appetite change and fatigue.  HENT: Positive for congestion, postnasal drip, rhinorrhea and sneezing. Negative for dental problem, drooling, ear discharge, ear pain, facial swelling, hearing loss, mouth sores, nosebleeds, sinus pressure, sore throat,  tinnitus, trouble swallowing and voice change.   Eyes: Negative.   Respiratory: Negative.  Negative for apnea, cough, choking, chest tightness, shortness of breath, wheezing and stridor.   Cardiovascular: Negative.  Negative for chest pain, palpitations and leg swelling.  Gastrointestinal: Negative.  Negative for nausea, vomiting, abdominal pain, diarrhea, constipation and blood in stool.  Endocrine: Negative.   Genitourinary: Negative.   Musculoskeletal: Positive for back pain. Negative for myalgias, joint swelling, arthralgias, gait problem, neck pain and neck stiffness.  Skin: Negative.  Negative for rash.  Allergic/Immunologic: Negative.   Neurological: Negative.  Negative for dizziness, tremors, syncope, light-headedness, numbness and headaches.  Hematological: Negative.  Negative for adenopathy. Does not bruise/bleed easily.  Psychiatric/Behavioral: Positive for sleep disturbance. Negative for suicidal ideas, dysphoric mood, decreased concentration and agitation. The patient is not nervous/anxious.     Objective:  BP 142/62 mmHg  Pulse 71  Temp(Src) 98.3 F (36.8 C) (Oral)  Resp 16  Ht 5\' 3"  (1.6 m)  Wt 149 lb 4 oz (67.699 kg)  BMI 26.44 kg/m2  SpO2 98%  BP Readings from Last 3 Encounters:  04/19/15 142/62  12/18/14 132/78  10/17/14 164/80    Wt Readings from Last 3 Encounters:  04/19/15 149 lb 4 oz (67.699 kg)  10/17/14 154 lb (69.854 kg)  10/12/14 156 lb (70.761 kg)    Physical Exam  Constitutional: She is oriented to person, place, and time. She appears well-developed  and well-nourished.  Non-toxic appearance. She does not have a sickly appearance. She does not appear ill. No distress.  HENT:  Head: Normocephalic and atraumatic.  Nose: Mucosal edema present. No rhinorrhea, nose lacerations, sinus tenderness, nasal deformity, septal deviation or nasal septal hematoma. No epistaxis.  No foreign bodies. Right sinus exhibits no maxillary sinus tenderness and no frontal  sinus tenderness. Left sinus exhibits no maxillary sinus tenderness and no frontal sinus tenderness.  Mouth/Throat: Oropharynx is clear and moist. No oropharyngeal exudate.  Left anterior NP is pale and swollen  Eyes: Conjunctivae are normal. Right eye exhibits no discharge. Left eye exhibits no discharge. No scleral icterus.  Neck: Normal range of motion. Neck supple. No JVD present. No tracheal deviation present. No thyromegaly present.  Cardiovascular: Normal rate, regular rhythm, normal heart sounds and intact distal pulses.  Exam reveals no gallop and no friction rub.   No murmur heard. Pulmonary/Chest: Effort normal and breath sounds normal. No stridor. No respiratory distress. She has no wheezes. She has no rales. She exhibits no tenderness.  Abdominal: Soft. Bowel sounds are normal. She exhibits no distension and no mass. There is no tenderness. There is no rebound and no guarding.  Musculoskeletal: Normal range of motion. She exhibits no edema or tenderness.  Lymphadenopathy:    She has no cervical adenopathy.  Neurological: She is oriented to person, place, and time.  Skin: Skin is warm and dry. No rash noted. She is not diaphoretic. No erythema. No pallor.  Vitals reviewed.   Lab Results  Component Value Date   WBC 5.8 08/14/2014   HGB 12.1 08/14/2014   HCT 36.5 08/14/2014   PLT 257.0 08/14/2014   GLUCOSE 103* 12/18/2014   CHOL 170 08/14/2014   TRIG 58.0 08/14/2014   HDL 54.70 08/14/2014   LDLCALC 104* 08/14/2014   ALT 19 08/14/2014   AST 24 08/14/2014   NA 141 12/18/2014   K 4.6 12/18/2014   CL 108 12/18/2014   CREATININE 1.32* 12/18/2014   BUN 33* 12/18/2014   CO2 22 12/18/2014   TSH 1.62 08/14/2014   HGBA1C 6.5 12/18/2014   MICROALBUR 0.6 12/18/2014    Dg Femur Right  08/14/2014   CLINICAL DATA:  Right thigh pain for 4 weeks, no known injury  EXAM: RIGHT FEMUR - 2 VIEW  COMPARISON:  None.  FINDINGS: Five views of the right femur submitted. No acute fracture or  subluxation. No periosteal reaction or bony erosion. The hip joint is unremarkable. Mild narrowing of patellofemoral joint space.  IMPRESSION: No acute fracture or subluxation. No periosteal reaction or bony erosion.   Electronically Signed   By: Lahoma Crocker M.D.   On: 08/14/2014 15:18    Assessment & Plan:   Rosena was seen today for allergic rhinitis  and hypertension.  Diagnoses and all orders for this visit:  Essential hypertension Orders: -     diltiazem (TIAZAC) 300 MG 24 hr capsule; Take 1 capsule (300 mg total) by mouth daily before breakfast. -     hydrochlorothiazide (MICROZIDE) 12.5 MG capsule; TAKE 1 CAPSULE (12.5 MG TOTAL) BY MOUTH DAILY. -     metoprolol (LOPRESSOR) 50 MG tablet; Take 1 tablet (50 mg total) by mouth daily.  Allergic rhinitis due to pollen Orders: -     mometasone (NASONEX) 50 MCG/ACT nasal spray; Place 4 sprays into the nose daily. -     Discontinue: methylPREDNISolone (MEDROL DOSEPAK) 4 MG TBPK tablet; Take as directed -     levocetirizine (XYZAL)  5 MG tablet; Take 1 tablet (5 mg total) by mouth every evening.  Bilateral back pain, unspecified location Orders: -     diclofenac sodium (VOLTAREN) 1 % GEL; Apply 2 g topically 4 (four) times daily.  Need for prophylactic vaccination against Streptococcus pneumoniae (pneumococcus) Orders: -     Cancel: Pneumococcal conjugate vaccine 13-valent IM  Other orders -     zolpidem (AMBIEN) 10 MG tablet; Take 1 tablet (10 mg total) by mouth at bedtime as needed.   I have discontinued Ms. Corso's allopurinol. I am also having her start on mometasone and levocetirizine. Additionally, I am having her maintain her aspirin, estropipate, traMADol, atorvastatin, losartan, diclofenac sodium, diltiazem, hydrochlorothiazide, zolpidem, and metoprolol.  Meds ordered this encounter  Medications  . diclofenac sodium (VOLTAREN) 1 % GEL    Sig: Apply 2 g topically 4 (four) times daily.    Dispense:  100 g    Refill:  5  .  diltiazem (TIAZAC) 300 MG 24 hr capsule    Sig: Take 1 capsule (300 mg total) by mouth daily before breakfast.    Dispense:  90 capsule    Refill:  3  . hydrochlorothiazide (MICROZIDE) 12.5 MG capsule    Sig: TAKE 1 CAPSULE (12.5 MG TOTAL) BY MOUTH DAILY.    Dispense:  90 capsule    Refill:  1  . zolpidem (AMBIEN) 10 MG tablet    Sig: Take 1 tablet (10 mg total) by mouth at bedtime as needed.    Dispense:  90 tablet    Refill:  1  . metoprolol (LOPRESSOR) 50 MG tablet    Sig: Take 1 tablet (50 mg total) by mouth daily.    Dispense:  90 tablet    Refill:  3  . mometasone (NASONEX) 50 MCG/ACT nasal spray    Sig: Place 4 sprays into the nose daily.    Dispense:  17 g    Refill:  12  . DISCONTD: methylPREDNISolone (MEDROL DOSEPAK) 4 MG TBPK tablet    Sig: Take as directed    Dispense:  21 tablet    Refill:  0  . levocetirizine (XYZAL) 5 MG tablet    Sig: Take 1 tablet (5 mg total) by mouth every evening.    Dispense:  90 tablet    Refill:  3     Follow-up: Return in about 4 weeks (around 05/17/2015).  Scarlette Calico, MD

## 2015-04-19 NOTE — Patient Instructions (Signed)

## 2015-04-20 MED ORDER — METHYLPREDNISOLONE 4 MG PO TBPK: ORAL_TABLET | ORAL | Status: DC

## 2015-04-20 NOTE — Telephone Encounter (Signed)
Medrol dose pk sent to local pharmacy per request.

## 2015-06-05 ENCOUNTER — Ambulatory Visit (INDEPENDENT_AMBULATORY_CARE_PROVIDER_SITE_OTHER): Payer: Medicare Other | Admitting: Internal Medicine

## 2015-06-05 ENCOUNTER — Encounter: Payer: Self-pay | Admitting: Internal Medicine

## 2015-06-05 VITALS — BP 136/70 | HR 76 | Temp 98.4°F | Resp 18 | Ht 62.0 in | Wt 148.0 lb

## 2015-06-05 DIAGNOSIS — J301 Allergic rhinitis due to pollen: Secondary | ICD-10-CM | POA: Diagnosis not present

## 2015-06-05 DIAGNOSIS — I10 Essential (primary) hypertension: Secondary | ICD-10-CM

## 2015-06-05 DIAGNOSIS — J0100 Acute maxillary sinusitis, unspecified: Secondary | ICD-10-CM | POA: Insufficient documentation

## 2015-06-05 DIAGNOSIS — J324 Chronic pansinusitis: Secondary | ICD-10-CM

## 2015-06-05 MED ORDER — AZELASTINE HCL 0.1 % NA SOLN: 2.0000 | Freq: Two times a day (BID) | NASAL | Status: DC

## 2015-06-05 NOTE — Progress Notes (Signed)
Subjective:  Patient ID: Sarah [name], female    DOB: Jan 25, 1941  Age: 74 y.o. MRN: [id number]  CC: Sinus Problem   HPI ARLY SALMINEN presents for the complaint of persistent nasal congestion, occasional bloody nose, sneezing and post-nasal drip. She has not gotten any relief with a medrol dose pak, antihistamines, steroid ns, or decongestants. She now tells me that she was previously told by an ENT that she needed sinus surgery.  Outpatient Prescriptions Prior to Visit  Medication Sig Dispense Refill  . aspirin 81 MG tablet Take 1 tablet (81 mg total) by mouth daily. 30 tablet 6  . atorvastatin (LIPITOR) 10 MG tablet Take 1 tablet (10 mg total) by mouth daily. 90 tablet 3  . diltiazem (TIAZAC) 300 MG 24 hr capsule Take 1 capsule (300 mg total) by mouth daily before breakfast. 90 capsule 3  . hydrochlorothiazide (MICROZIDE) 12.5 MG capsule TAKE 1 CAPSULE (12.5 MG TOTAL) BY MOUTH DAILY. 90 capsule 1  . levocetirizine (XYZAL) 5 MG tablet Take 1 tablet (5 mg total) by mouth every evening. 90 tablet 3  . losartan (COZAAR) 100 MG tablet Take 1 tablet (100 mg total) by mouth daily after lunch. 90 tablet 3  . metoprolol (LOPRESSOR) 50 MG tablet Take 1 tablet (50 mg total) by mouth daily. 90 tablet 3  . mometasone (NASONEX) 50 MCG/ACT nasal spray Place 4 sprays into the nose daily. 17 g 12  . traMADol (ULTRAM) 50 MG tablet Take 1 tablet (50 mg total) by mouth every 8 (eight) hours as needed. 30 tablet 0  . zolpidem (AMBIEN) 10 MG tablet Take 1 tablet (10 mg total) by mouth at bedtime as needed. 90 tablet 1  . diclofenac sodium (VOLTAREN) 1 % GEL Apply 2 g topically 4 (four) times daily. (Patient not taking: Reported on 06/05/2015) 100 g 5  . estropipate (OGEN) 1.5 MG tablet Take 0.5 tablets (0.75 mg total) by mouth daily. Takes 1/2 tablet (Patient not taking: Reported on 06/05/2015) 15 tablet 3  . methylPREDNISolone (MEDROL DOSEPAK) 4 MG TBPK tablet Take as directed (Patient not taking: Reported on  06/05/2015) 21 tablet 0   No facility-administered medications prior to visit.    ROS Review of Systems  Constitutional: Negative.  Negative for fever, chills, diaphoresis, appetite change and fatigue.  HENT: Positive for congestion, nosebleeds, postnasal drip, rhinorrhea and sneezing. Negative for dental problem, ear discharge, ear pain, facial swelling, hearing loss, mouth sores, sinus pressure, sore throat, tinnitus, trouble swallowing and voice change.   Eyes: Negative.   Respiratory: Negative.  Negative for cough, choking, chest tightness, shortness of breath and stridor.   Cardiovascular: Negative.  Negative for chest pain, palpitations and leg swelling.  Gastrointestinal: Negative.  Negative for nausea, vomiting, abdominal pain, diarrhea, constipation and blood in stool.  Endocrine: Negative.   Genitourinary: Negative.   Musculoskeletal: Negative.   Skin: Negative.  Negative for rash.  Allergic/Immunologic: Negative.   Neurological: Negative.   Hematological: Negative.  Negative for adenopathy. Does not bruise/bleed easily.  Psychiatric/Behavioral: Negative.     Objective:  BP 136/70 mmHg  Pulse 76  Temp(Src) 98.4 F (36.9 C) (Oral)  Resp 18  Ht 5\' 2"  (1.575 m)  Wt 148 lb (67.132 kg)  BMI 27.06 kg/m2  SpO2 98%  BP Readings from Last 3 Encounters:  06/05/15 136/70  04/19/15 142/62  12/18/14 132/78    Wt Readings from Last 3 Encounters:  06/05/15 148 lb (67.132 kg)  04/19/15 149 lb 4 oz (67.699 kg)  10/17/14 154 lb (69.854 kg)    Physical Exam  Constitutional: She is oriented to person, place, and time. She appears well-developed and well-nourished. No distress.  HENT:  Head: Normocephalic and atraumatic.  Right Ear: Hearing, tympanic membrane, external ear and ear canal normal.  Left Ear: Hearing, tympanic membrane, external ear and ear canal normal.  Nose: Nose normal. No mucosal edema, rhinorrhea, nose lacerations, sinus tenderness, nasal deformity, septal  deviation or nasal septal hematoma. No epistaxis.  No foreign bodies. Right sinus exhibits no maxillary sinus tenderness and no frontal sinus tenderness. Left sinus exhibits no maxillary sinus tenderness and no frontal sinus tenderness.  Mouth/Throat: Oropharynx is clear and moist and mucous membranes are normal. Mucous membranes are not pale, not dry and not cyanotic. No oral lesions. No trismus in the jaw. No uvula swelling. No oropharyngeal exudate, posterior oropharyngeal edema, posterior oropharyngeal erythema or tonsillar abscesses.  Eyes: Conjunctivae are normal. Right eye exhibits no discharge. Left eye exhibits no discharge. No scleral icterus.  Neck: Normal range of motion. Neck supple. No JVD present. No tracheal deviation present. No thyromegaly present.  Cardiovascular: Normal rate, regular rhythm, normal heart sounds and intact distal pulses.  Exam reveals no gallop and no friction rub.   No murmur heard. Pulmonary/Chest: Effort normal and breath sounds normal. No stridor. No respiratory distress. She has no wheezes. She has no rales. She exhibits no tenderness.  Abdominal: Soft. Bowel sounds are normal. She exhibits no distension and no mass. There is no tenderness. There is no rebound and no guarding.  Musculoskeletal: Normal range of motion. She exhibits no edema or tenderness.  Lymphadenopathy:    She has no cervical adenopathy.  Neurological: She is oriented to person, place, and time.  Skin: Skin is warm and dry. No rash noted. She is not diaphoretic. No erythema. No pallor.  Vitals reviewed.   Lab Results  Component Value Date   WBC 5.8 08/14/2014   HGB 12.1 08/14/2014   HCT 36.5 08/14/2014   PLT 257.0 08/14/2014   GLUCOSE 103* 12/18/2014   CHOL 170 08/14/2014   TRIG 58.0 08/14/2014   HDL 54.70 08/14/2014   LDLCALC 104* 08/14/2014   ALT 19 08/14/2014   AST 24 08/14/2014   NA 141 12/18/2014   K 4.6 12/18/2014   CL 108 12/18/2014   CREATININE 1.32* 12/18/2014   BUN  33* 12/18/2014   CO2 22 12/18/2014   TSH 1.62 08/14/2014   HGBA1C 6.5 12/18/2014   MICROALBUR 0.6 12/18/2014    Dg Femur Right  08/14/2014   CLINICAL DATA:  Right thigh pain for 4 weeks, no known injury  EXAM: RIGHT FEMUR - 2 VIEW  COMPARISON:  None.  FINDINGS: Five views of the right femur submitted. No acute fracture or subluxation. No periosteal reaction or bony erosion. The hip joint is unremarkable. Mild narrowing of patellofemoral joint space.  IMPRESSION: No acute fracture or subluxation. No periosteal reaction or bony erosion.   Electronically Signed   By: Lahoma Crocker M.D.   On: 08/14/2014 15:18    Assessment & Plan:   Rebakah was seen today for sinus problem.  Diagnoses and all orders for this visit:  Chronic pansinusitis- will get a CT scan done to see if there is a deviated septum, blockage of the OM complex, polyps, mass, etc. I have also asked her to see ENT. Orders: -     Ambulatory referral to ENT -     CT Maxillofacial W/Cm; Future  Allergic rhinitis due  to pollen- will add astelin for additional symptom relief Orders: -     azelastine (ASTELIN) 0.1 % nasal spray; Place 2 sprays into both nostrils 2 (two) times daily. Use in each nostril as directed   I have discontinued Ms. Stanly's estropipate, diclofenac sodium, and methylPREDNISolone. I am also having her start on azelastine. Additionally, I am having her maintain her aspirin, traMADol, atorvastatin, losartan, diltiazem, hydrochlorothiazide, zolpidem, metoprolol, mometasone, and levocetirizine.  Meds ordered this encounter  Medications  . azelastine (ASTELIN) 0.1 % nasal spray    Sig: Place 2 sprays into both nostrils 2 (two) times daily. Use in each nostril as directed    Dispense:  30 mL    Refill:  11     Follow-up: Return in about 3 months (around 09/05/2015).  Scarlette Calico, MD

## 2015-06-05 NOTE — Progress Notes (Signed)
Pre visit review using our clinic review tool, if applicable. No additional management support is needed unless otherwise documented below in the visit note. 

## 2015-06-05 NOTE — Patient Instructions (Signed)

## 2015-06-06 ENCOUNTER — Other Ambulatory Visit (INDEPENDENT_AMBULATORY_CARE_PROVIDER_SITE_OTHER): Payer: Medicare Other

## 2015-06-06 DIAGNOSIS — J324 Chronic pansinusitis: Secondary | ICD-10-CM | POA: Diagnosis not present

## 2015-06-07 LAB — BASIC METABOLIC PANEL
BUN: 40 mg/dL — ABNORMAL HIGH (ref 6–23)
CALCIUM: 9.7 mg/dL (ref 8.4–10.5)
CO2: 25 meq/L (ref 19–32)
Chloride: 101 mEq/L (ref 96–112)
Creatinine, Ser: 1.5 mg/dL — ABNORMAL HIGH (ref 0.40–1.20)
GFR: 43.74 mL/min — ABNORMAL LOW (ref >60.00)
GLUCOSE: 89 mg/dL (ref 70–99)
POTASSIUM: 4.5 meq/L (ref 3.5–5.1)
Sodium: 137 mEq/L (ref 135–145)

## 2015-08-24 ENCOUNTER — Other Ambulatory Visit: Payer: Self-pay | Admitting: Otolaryngology

## 2015-08-28 ENCOUNTER — Other Ambulatory Visit: Payer: Self-pay

## 2015-08-28 MED ORDER — ZOLPIDEM TARTRATE 10 MG PO TABS: 10.0000 mg | ORAL_TABLET | Freq: Every evening | ORAL | Status: DC | PRN

## 2015-08-28 NOTE — Telephone Encounter (Signed)
Incoming fax for rF

## 2015-11-16 ENCOUNTER — Telehealth: Payer: Self-pay

## 2015-11-16 NOTE — Telephone Encounter (Signed)
PA initiated via covermymeds. Key for this medication is [id number]. The medication is zolpidem.

## 2015-12-25 ENCOUNTER — Other Ambulatory Visit: Payer: Self-pay | Admitting: Internal Medicine

## 2016-01-11 ENCOUNTER — Other Ambulatory Visit: Payer: Self-pay

## 2016-01-11 MED ORDER — HYDROCHLOROTHIAZIDE 12.5 MG PO CAPS: ORAL_CAPSULE | ORAL | Status: DC

## 2016-01-24 ENCOUNTER — Encounter: Payer: Self-pay | Admitting: Internal Medicine

## 2016-01-24 ENCOUNTER — Ambulatory Visit (INDEPENDENT_AMBULATORY_CARE_PROVIDER_SITE_OTHER)
Admission: RE | Admit: 2016-01-24 | Discharge: 2016-01-24 | Disposition: A | Discharge status: Home / Self Care | Payer: Medicare Other | Source: Ambulatory Visit | Attending: Internal Medicine | Admitting: Internal Medicine

## 2016-01-24 ENCOUNTER — Ambulatory Visit (INDEPENDENT_AMBULATORY_CARE_PROVIDER_SITE_OTHER): Payer: Medicare Other | Admitting: Internal Medicine

## 2016-01-24 VITALS — BP 158/60 | HR 80 | Temp 98.6°F | Resp 20 | Ht 62.0 in | Wt 157.0 lb

## 2016-01-24 DIAGNOSIS — R05 Cough: Secondary | ICD-10-CM

## 2016-01-24 DIAGNOSIS — J301 Allergic rhinitis due to pollen: Secondary | ICD-10-CM | POA: Diagnosis not present

## 2016-01-24 DIAGNOSIS — E785 Hyperlipidemia, unspecified: Secondary | ICD-10-CM | POA: Diagnosis not present

## 2016-01-24 DIAGNOSIS — J4541 Moderate persistent asthma with (acute) exacerbation: Secondary | ICD-10-CM

## 2016-01-24 DIAGNOSIS — Z23 Encounter for immunization: Secondary | ICD-10-CM | POA: Diagnosis not present

## 2016-01-24 DIAGNOSIS — R059 Cough, unspecified: Secondary | ICD-10-CM

## 2016-01-24 DIAGNOSIS — I10 Essential (primary) hypertension: Secondary | ICD-10-CM

## 2016-01-24 DIAGNOSIS — E1311 Other specified diabetes mellitus with ketoacidosis with coma: Secondary | ICD-10-CM

## 2016-01-24 DIAGNOSIS — J4540 Moderate persistent asthma, uncomplicated: Secondary | ICD-10-CM | POA: Insufficient documentation

## 2016-01-24 DIAGNOSIS — E1111 Type 2 diabetes mellitus with ketoacidosis with coma: Secondary | ICD-10-CM

## 2016-01-24 DIAGNOSIS — Z794 Long term (current) use of insulin: Secondary | ICD-10-CM

## 2016-01-24 MED ORDER — MONTELUKAST SODIUM 10 MG PO TABS
10.0000 mg | ORAL_TABLET | Freq: Every day | ORAL | Status: DC
Start: 2016-01-24 — End: 2016-01-24

## 2016-01-24 MED ORDER — LEVOCETIRIZINE DIHYDROCHLORIDE 5 MG PO TABS: 5.0000 mg | ORAL_TABLET | Freq: Every evening | ORAL | Status: DC

## 2016-01-24 MED ORDER — ZOLPIDEM TARTRATE 10 MG PO TABS: 10.0000 mg | ORAL_TABLET | Freq: Every evening | ORAL | Status: DC | PRN

## 2016-01-24 MED ORDER — HYDROCODONE-HOMATROPINE 5-1.5 MG/5ML PO SYRP: 5.0000 mL | ORAL_SOLUTION | Freq: Three times a day (TID) | ORAL | Status: DC | PRN

## 2016-01-24 MED ORDER — HYDROCHLOROTHIAZIDE 12.5 MG PO CAPS: ORAL_CAPSULE | ORAL | Status: DC

## 2016-01-24 MED ORDER — MONTELUKAST SODIUM 10 MG PO TABS
10.0000 mg | ORAL_TABLET | Freq: Every day | ORAL | Status: DC
Start: 2016-01-24 — End: 2017-04-30

## 2016-01-24 MED ORDER — IPRATROPIUM-ALBUTEROL 20-100 MCG/ACT IN AERS: 1.0000 | INHALATION_SPRAY | Freq: Four times a day (QID) | RESPIRATORY_TRACT | Status: DC | PRN

## 2016-01-24 MED ORDER — ATORVASTATIN CALCIUM 10 MG PO TABS: 10.0000 mg | ORAL_TABLET | Freq: Every day | ORAL | Status: DC

## 2016-01-24 MED ORDER — METOPROLOL TARTRATE 50 MG PO TABS: 50.0000 mg | ORAL_TABLET | Freq: Every day | ORAL | Status: DC

## 2016-01-24 MED ORDER — METHYLPREDNISOLONE ACETATE 80 MG/ML IJ SUSP
120.0000 mg | Freq: Once | INTRAMUSCULAR | Status: AC
  Administered 2016-01-24: 120 mg via INTRAMUSCULAR

## 2016-01-24 MED ORDER — LOSARTAN POTASSIUM 100 MG PO TABS: 100.0000 mg | ORAL_TABLET | Freq: Every day | ORAL | Status: DC

## 2016-01-24 MED ORDER — ALBUTEROL SULFATE (2.5 MG/3ML) 0.083% IN NEBU
2.5000 mg | INHALATION_SOLUTION | Freq: Once | RESPIRATORY_TRACT | Status: AC
Start: 2016-01-24 — End: 2016-01-24
  Administered 2016-01-24: 2.5 mg via RESPIRATORY_TRACT

## 2016-01-24 NOTE — Progress Notes (Signed)
Subjective:  Patient ID: Sarah [name], female    DOB: 1941/03/18  Age: 75 y.o. MRN: [id number]  CC: Cough and Asthma   HPI Sarah [name] presents for the complaint of a 2 week history of cough that is productive of thick yellow phlegm with postnasal drip, wheezing and shortness of breath.  Outpatient Prescriptions Prior to Visit  Medication Sig Dispense Refill  . aspirin 81 MG tablet Take 1 tablet (81 mg total) by mouth daily. 30 tablet 6  . azelastine (ASTELIN) 0.1 % nasal spray Place 2 sprays into both nostrils 2 (two) times daily. Use in each nostril as directed 30 mL 11  . diltiazem (TIAZAC) 300 MG 24 hr capsule Take 1 capsule (300 mg total) by mouth daily before breakfast. 90 capsule 3  . mometasone (NASONEX) 50 MCG/ACT nasal spray Place 4 sprays into the nose daily. 17 g 12  . traMADol (ULTRAM) 50 MG tablet Take 1 tablet (50 mg total) by mouth every 8 (eight) hours as needed. 30 tablet 0  . atorvastatin (LIPITOR) 10 MG tablet Take 1 tablet (10 mg total) by mouth daily. 90 tablet 3  . hydrochlorothiazide (MICROZIDE) 12.5 MG capsule TAKE 1 (12.5MG ) BY MOUTH DAILY 90 capsule 1  . levocetirizine (XYZAL) 5 MG tablet Take 1 tablet (5 mg total) by mouth every evening. 90 tablet 3  . metoprolol (LOPRESSOR) 50 MG tablet Take 1 tablet (50 mg total) by mouth daily. 90 tablet 3  . zolpidem (AMBIEN) 10 MG tablet Take 1 tablet (10 mg total) by mouth at bedtime as needed. 90 tablet 1  . losartan (COZAAR) 100 MG tablet Take 1 tablet (100 mg total) by mouth daily after lunch. (Patient not taking: Reported on 01/24/2016) 90 tablet 3   No facility-administered medications prior to visit.    ROS Review of Systems  Constitutional: Negative.  Negative for fever, chills, diaphoresis, activity change, appetite change, fatigue and unexpected weight change.  HENT: Positive for congestion, postnasal drip and rhinorrhea. Negative for facial swelling, sinus pressure, sore throat, trouble swallowing and voice  change.   Eyes: Negative.   Respiratory: Positive for cough, shortness of breath and wheezing. Negative for apnea, choking, chest tightness and stridor.   Cardiovascular: Negative.  Negative for chest pain, palpitations and leg swelling.  Gastrointestinal: Negative.  Negative for nausea, vomiting, abdominal pain, diarrhea, constipation and blood in stool.  Endocrine: Negative.   Genitourinary: Negative.   Musculoskeletal: Negative.  Negative for myalgias, back pain, joint swelling and arthralgias.  Skin: Negative.  Negative for color change, pallor and rash.  Allergic/Immunologic: Negative.   Neurological: Negative.   Hematological: Negative.  Negative for adenopathy. Does not bruise/bleed easily.  Psychiatric/Behavioral: Negative.     Objective:  BP 158/60 mmHg  Pulse 80  Temp(Src) 98.6 F (37 C) (Oral)  Resp 20  Ht 5\' 2"  (1.575 m)  Wt 157 lb (71.215 kg)  BMI 28.71 kg/m2  SpO2 97%  BP Readings from Last 3 Encounters:  01/24/16 158/60  06/05/15 136/70  04/19/15 142/62    Wt Readings from Last 3 Encounters:  01/24/16 157 lb (71.215 kg)  06/05/15 148 lb (67.132 kg)  04/19/15 149 lb 4 oz (67.699 kg)    Physical Exam  Constitutional: She is oriented to person, place, and time. She appears well-developed and well-nourished.  Non-toxic appearance. She does not have a sickly appearance. She does not appear ill. No distress.  HENT:  Head: Normocephalic and atraumatic.  Nose: Mucosal edema and rhinorrhea present.  Right sinus exhibits no maxillary sinus tenderness and no frontal sinus tenderness. Left sinus exhibits no maxillary sinus tenderness and no frontal sinus tenderness.  Mouth/Throat: Oropharynx is clear and moist. No oropharyngeal exudate.  Eyes: Conjunctivae are normal. Right eye exhibits no discharge. Left eye exhibits no discharge. No scleral icterus.  Neck: Normal range of motion. Neck supple. No JVD present. No tracheal deviation present. No thyromegaly present.    Cardiovascular: Normal rate, regular rhythm, normal heart sounds and intact distal pulses.  Exam reveals no gallop and no friction rub.   No murmur heard. Pulmonary/Chest: Effort normal. No accessory muscle usage or stridor. No respiratory distress. She has no decreased breath sounds. She has wheezes in the right middle field and the left middle field. She has no rhonchi. She has no rales. She exhibits no tenderness.  There are faint, late inspiratory wheezes in both middle zones.  The wheezes improved by 50% after she received a jet neb treatment with albuterol.  Abdominal: Soft. Bowel sounds are normal. She exhibits no distension and no mass. There is no tenderness. There is no rebound and no guarding.  Musculoskeletal: Normal range of motion. She exhibits no edema or tenderness.  Lymphadenopathy:    She has no cervical adenopathy.  Neurological: She is oriented to person, place, and time.  Skin: Skin is warm and dry. No rash noted. She is not diaphoretic. No erythema. No pallor.  Vitals reviewed.   Lab Results  Component Value Date   WBC 5.8 08/14/2014   HGB 12.1 08/14/2014   HCT 36.5 08/14/2014   PLT 257.0 08/14/2014   GLUCOSE 89 06/06/2015   CHOL 170 08/14/2014   TRIG 58.0 08/14/2014   HDL 54.70 08/14/2014   LDLCALC 104* 08/14/2014   ALT 19 08/14/2014   AST 24 08/14/2014   NA 137 06/06/2015   K 4.5 06/06/2015   CL 101 06/06/2015   CREATININE 1.50* 06/06/2015   BUN 40* 06/06/2015   CO2 25 06/06/2015   TSH 1.62 08/14/2014   HGBA1C 6.5 12/18/2014   MICROALBUR 0.6 12/18/2014    Dg Femur Right  08/14/2014  CLINICAL DATA:  Right thigh pain for 4 weeks, no known injury EXAM: RIGHT FEMUR - 2 VIEW COMPARISON:  None. FINDINGS: Five views of the right femur submitted. No acute fracture or subluxation. No periosteal reaction or bony erosion. The hip joint is unremarkable. Mild narrowing of patellofemoral joint space. IMPRESSION: No acute fracture or subluxation. No periosteal  reaction or bony erosion. Electronically Signed   By: Lahoma Crocker M.D.   On: 08/14/2014 15:18   Dg Chest 2 View  01/24/2016  CLINICAL DATA:  Patient with cough for 7 days. EXAM: CHEST  2 VIEW COMPARISON:  Chest radiograph 01/07/2013. FINDINGS: Normal cardiac and mediastinal contours. No consolidative pulmonary opacities. No pleural effusion or pneumothorax. Regional skeleton is unremarkable. IMPRESSION: No active cardiopulmonary disease. Electronically Signed   By: Lovey Newcomer M.D.   On: 01/24/2016 16:57    Assessment & Plan:   Moranda was seen today for cough and asthma.  Diagnoses and all orders for this visit:  Essential hypertension- Her blood pressure is adequately well controlled -     metoprolol (LOPRESSOR) 50 MG tablet; Take 1 tablet (50 mg total) by mouth daily. -     losartan (COZAAR) 100 MG tablet; Take 1 tablet (100 mg total) by mouth daily after lunch.  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 -     atorvastatin (LIPITOR) 10 MG tablet; Take 1 tablet (10 mg total) by mouth daily.  Hyperlipidemia with target LDL less than 100 -     atorvastatin (LIPITOR) 10 MG tablet; Take 1 tablet (10 mg total) by mouth daily.  Asthma with acute exacerbation, moderate persistent- she is having a moderately severe flare of asthma so I gave her an injection of Depo-Medrol and a jet neb treatment with albuterol. She has had a good response. Will start treating the asthma with Singulair tablet once a day as well as Combivent inhaler. When I see you for follow-up I will consider adding on an inhaled corticosteroid. -     Discontinue: Ipratropium-Albuterol (COMBIVENT RESPIMAT) 20-100 MCG/ACT AERS respimat; Inhale 1 puff into the lungs every 6 (six) hours as needed for wheezing. -     Discontinue: montelukast (SINGULAIR) 10 MG tablet; Take 1 tablet  (10 mg total) by mouth at bedtime. -     Discontinue: montelukast (SINGULAIR) 10 MG tablet; Take 1 tablet (10 mg total) by mouth at bedtime. -     Discontinue: Ipratropium-Albuterol (COMBIVENT RESPIMAT) 20-100 MCG/ACT AERS respimat; Inhale 1 puff into the lungs every 6 (six) hours as needed for wheezing. -     Ipratropium-Albuterol (COMBIVENT RESPIMAT) 20-100 MCG/ACT AERS respimat; Inhale 1 puff into the lungs every 6 (six) hours as needed for wheezing. -     montelukast (SINGULAIR) 10 MG tablet; Take 1 tablet (10 mg total) by mouth at bedtime. -     methylPREDNISolone acetate (DEPO-MEDROL) injection 120 mg; Inject 1.5 mLs (120 mg total) into the muscle once. -     albuterol (PROVENTIL) (2.5 MG/3ML) 0.083% nebulizer solution 2.5 mg; Take 3 mLs (2.5 mg total) by nebulization once.  Non-seasonal allergic rhinitis due to pollen- will treat with xyzal and singulair -     Discontinue: montelukast (SINGULAIR) 10 MG tablet; Take 1 tablet (10 mg total) by mouth at bedtime. -     Discontinue: montelukast (SINGULAIR) 10 MG tablet; Take 1 tablet (10 mg total) by mouth at bedtime. -     levocetirizine (XYZAL) 5 MG tablet; Take 1 tablet (5 mg total) by mouth every evening. -     montelukast (SINGULAIR) 10 MG tablet; Take 1 tablet (10 mg total) by mouth at bedtime.  Cough- her CXR is normal, will treat for a viral URI -     DG Chest 2 View; Future -     HYDROcodone-homatropine (HYCODAN) 5-1.5 MG/5ML syrup; Take 5 mLs by mouth every 8 (eight) hours as needed for cough.  Other orders -     zolpidem (AMBIEN) 10 MG tablet; Take 1 tablet (10 mg total) by mouth at bedtime as needed. -     hydrochlorothiazide (MICROZIDE) 12.5 MG capsule; TAKE 1 (12.5MG ) BY MOUTH DAILY -     Flu Vaccine QUAD 36+ mos IM   I am having Ms. Trussell start on HYDROcodone-homatropine. I am also having her maintain her aspirin, traMADol, diltiazem, mometasone, azelastine, metoprolol, zolpidem, losartan, hydrochlorothiazide, atorvastatin,  levocetirizine, Ipratropium-Albuterol, and montelukast. We administered methylPREDNISolone acetate and albuterol.  Meds ordered this encounter  Medications  . metoprolol (LOPRESSOR) 50 MG tablet    Sig: Take 1 tablet (50 mg total) by mouth daily.    Dispense:  90 tablet    Refill:  3  . zolpidem (AMBIEN) 10 MG tablet    Sig: Take 1 tablet (  10 mg total) by mouth at bedtime as needed.    Dispense:  90 tablet    Refill:  1  . losartan (COZAAR) 100 MG tablet    Sig: Take 1 tablet (100 mg total) by mouth daily after lunch.    Dispense:  90 tablet    Refill:  3  . hydrochlorothiazide (MICROZIDE) 12.5 MG capsule    Sig: TAKE 1 (12.5MG ) BY MOUTH DAILY    Dispense:  90 capsule    Refill:  1  . atorvastatin (LIPITOR) 10 MG tablet    Sig: Take 1 tablet (10 mg total) by mouth daily.    Dispense:  90 tablet    Refill:  3  . DISCONTD: Ipratropium-Albuterol (COMBIVENT RESPIMAT) 20-100 MCG/ACT AERS respimat    Sig: Inhale 1 puff into the lungs every 6 (six) hours as needed for wheezing.    Dispense:  4 g    Refill:  11  . DISCONTD: montelukast (SINGULAIR) 10 MG tablet    Sig: Take 1 tablet (10 mg total) by mouth at bedtime.    Dispense:  30 tablet    Refill:  11  . DISCONTD: montelukast (SINGULAIR) 10 MG tablet    Sig: Take 1 tablet (10 mg total) by mouth at bedtime.    Dispense:  30 tablet    Refill:  11  . levocetirizine (XYZAL) 5 MG tablet    Sig: Take 1 tablet (5 mg total) by mouth every evening.    Dispense:  90 tablet    Refill:  3  . DISCONTD: Ipratropium-Albuterol (COMBIVENT RESPIMAT) 20-100 MCG/ACT AERS respimat    Sig: Inhale 1 puff into the lungs every 6 (six) hours as needed for wheezing.    Dispense:  4 g    Refill:  11  . HYDROcodone-homatropine (HYCODAN) 5-1.5 MG/5ML syrup    Sig: Take 5 mLs by mouth every 8 (eight) hours as needed for cough.    Dispense:  120 mL    Refill:  0  . Ipratropium-Albuterol (COMBIVENT RESPIMAT) 20-100 MCG/ACT AERS respimat    Sig: Inhale 1  puff into the lungs every 6 (six) hours as needed for wheezing.    Dispense:  4 g    Refill:  11  . montelukast (SINGULAIR) 10 MG tablet    Sig: Take 1 tablet (10 mg total) by mouth at bedtime.    Dispense:  30 tablet    Refill:  11  . methylPREDNISolone acetate (DEPO-MEDROL) injection 120 mg    Sig:   . albuterol (PROVENTIL) (2.5 MG/3ML) 0.083% nebulizer solution 2.5 mg    Sig:      Follow-up: Return in about 3 weeks (around 02/14/2016).  Scarlette Calico, MD

## 2016-01-24 NOTE — Patient Instructions (Signed)
Asthma, Adult Asthma is a recurring condition in which the airways tighten and narrow. Asthma can make it difficult to breathe. It can cause coughing, wheezing, and shortness of breath. Asthma episodes, also called asthma attacks, range from minor to life-threatening. Asthma cannot be cured, but medicines and lifestyle changes can help control it. CAUSES Asthma is believed to be caused by inherited (genetic) and environmental factors, but its exact cause is unknown. Asthma may be triggered by allergens, lung infections, or irritants in the air. Asthma triggers are different for each person. Common triggers include:   Animal dander.  Dust mites.  Cockroaches.  Pollen from trees or grass.  Mold.  Smoke.  Air pollutants such as dust, household cleaners, hair sprays, aerosol sprays, paint fumes, strong chemicals, or strong odors.  Cold air, weather changes, and winds (which increase molds and pollens in the air).  Strong emotional expressions such as crying or laughing hard.  Stress.  Certain medicines (such as aspirin) or types of drugs (such as beta-blockers).  Sulfites in foods and drinks. Foods and drinks that may contain sulfites include dried fruit, potato chips, and sparkling grape juice.  Infections or inflammatory conditions such as the flu, a cold, or an inflammation of the nasal membranes (rhinitis).  Gastroesophageal reflux disease (GERD).  Exercise or strenuous activity. SYMPTOMS Symptoms may occur immediately after asthma is triggered or many hours later. Symptoms include:  Wheezing.  Excessive nighttime or early morning coughing.  Frequent or severe coughing with a common cold.  Chest tightness.  Shortness of breath. DIAGNOSIS  The diagnosis of asthma is made by a review of your medical history and a physical exam. Tests may also be performed. These may include:  Lung function studies. These tests show how much air you breathe in and out.  Allergy  tests.  Imaging tests such as X-rays. TREATMENT  Asthma cannot be cured, but it can usually be controlled. Treatment involves identifying and avoiding your asthma triggers. It also involves medicines. There are 2 classes of medicine used for asthma treatment:   Controller medicines. These prevent asthma symptoms from occurring. They are usually taken every day.  Reliever or rescue medicines. These quickly relieve asthma symptoms. They are used as needed and provide short-term relief. Your health care provider will help you create an asthma action plan. An asthma action plan is a written plan for managing and treating your asthma attacks. It includes a list of your asthma triggers and how they may be avoided. It also includes information on when medicines should be taken and when their dosage should be changed. An action plan may also involve the use of a device called a peak flow meter. A peak flow meter measures how well the lungs are working. It helps you monitor your condition. HOME CARE INSTRUCTIONS   Take medicines only as directed by your health care provider. Speak with your health care provider if you have questions about how or when to take the medicines.  Use a peak flow meter as directed by your health care provider. Record and keep track of readings.  Understand and use the action plan to help minimize or stop an asthma attack without needing to seek medical care.  Control your home environment in the following ways to help prevent asthma attacks:  Do not smoke. Avoid being exposed to secondhand smoke.  Change your heating and air conditioning filter regularly.  Limit your use of fireplaces and wood stoves.  Get rid of pests (such as roaches   and mice) and their droppings.  Throw away plants if you see mold on them.  Clean your floors and dust regularly. Use unscented cleaning products.  Try to have someone else vacuum for you regularly. Stay out of rooms while they are  being vacuumed and for a short while afterward. If you vacuum, use a dust mask from a hardware store, a double-layered or microfilter vacuum cleaner bag, or a vacuum cleaner with a HEPA filter.  Replace carpet with wood, tile, or vinyl flooring. Carpet can trap dander and dust.  Use allergy-proof pillows, mattress covers, and box spring covers.  Wash bed sheets and blankets every week in hot water and dry them in a dryer.  Use blankets that are made of polyester or cotton.  Clean bathrooms and kitchens with bleach. If possible, have someone repaint the walls in these rooms with mold-resistant paint. Keep out of the rooms that are being cleaned and painted.  Wash hands frequently. SEEK MEDICAL CARE IF:   You have wheezing, shortness of breath, or a cough even if taking medicine to prevent attacks.  The colored mucus you cough up (sputum) is thicker than usual.  Your sputum changes from clear or white to yellow, green, gray, or bloody.  You have any problems that may be related to the medicines you are taking (such as a rash, itching, swelling, or trouble breathing).  You are using a reliever medicine more than 2-3 times per week.  Your peak flow is still at 50-79% of your personal best after following your action plan for 1 hour.  You have a fever. SEEK IMMEDIATE MEDICAL CARE IF:   You seem to be getting worse and are unresponsive to treatment during an asthma attack.  You are short of breath even at rest.  You get short of breath when doing very little physical activity.  You have difficulty eating, drinking, or talking due to asthma symptoms.  You develop chest pain.  You develop a fast heartbeat.  You have a bluish color to your lips or fingernails.  You are light-headed, dizzy, or faint.  Your peak flow is less than 50% of your personal best.   This information is not intended to replace advice given to you by your health care provider. Make sure you discuss any  questions you have with your health care provider.   Document Released: 11/17/2005 Document Revised: 08/08/2015 Document Reviewed: 06/16/2013 Elsevier Interactive Patient Education 2016 [employer]  

## 2016-01-24 NOTE — Progress Notes (Signed)
Pre visit review using our clinic review tool, if applicable. No additional management support is needed unless otherwise documented below in the visit note. 

## 2016-02-06 ENCOUNTER — Telehealth: Payer: Self-pay

## 2016-02-06 NOTE — Telephone Encounter (Signed)
PA submitted via cover my meds. Key: Sarah [name]

## 2016-02-06 NOTE — Telephone Encounter (Signed)
Submitted clinical data to plan for determination of coverage

## 2016-02-07 ENCOUNTER — Telehealth: Payer: Self-pay | Admitting: Internal Medicine

## 2016-02-07 DIAGNOSIS — E118 Type 2 diabetes mellitus with unspecified complications: Secondary | ICD-10-CM

## 2016-02-07 DIAGNOSIS — E785 Hyperlipidemia, unspecified: Secondary | ICD-10-CM

## 2016-02-07 DIAGNOSIS — I10 Essential (primary) hypertension: Secondary | ICD-10-CM

## 2016-02-07 NOTE — Telephone Encounter (Signed)
Labs ordered.

## 2016-02-07 NOTE — Telephone Encounter (Signed)
PA approved for zolpidem 10 mg.  Pt informed and approval faxed to [facility] mail order.

## 2016-02-07 NOTE — Telephone Encounter (Signed)
Can enter labs if needed?

## 2016-02-07 NOTE — Telephone Encounter (Signed)
Pt informed

## 2016-02-07 NOTE — Telephone Encounter (Signed)
Patient was told that Dr. Ronnald Ramp wanted her to have some labs done.  I do not see labs entered in the system.  Patient has made an appointment for the 22nd of March and would like to come in on the morning of the 17th to have theses done.  Please follow up in regards.

## 2016-02-15 ENCOUNTER — Other Ambulatory Visit (INDEPENDENT_AMBULATORY_CARE_PROVIDER_SITE_OTHER): Payer: Medicare Other

## 2016-02-15 DIAGNOSIS — E118 Type 2 diabetes mellitus with unspecified complications: Secondary | ICD-10-CM

## 2016-02-15 DIAGNOSIS — I10 Essential (primary) hypertension: Secondary | ICD-10-CM

## 2016-02-15 DIAGNOSIS — E785 Hyperlipidemia, unspecified: Secondary | ICD-10-CM

## 2016-02-15 LAB — BASIC METABOLIC PANEL
BUN: 34 mg/dL — ABNORMAL HIGH (ref 6–23)
CHLORIDE: 108 meq/L (ref 96–112)
CO2: 24 mEq/L (ref 19–32)
Calcium: 9.6 mg/dL (ref 8.4–10.5)
Creatinine, Ser: 1.42 mg/dL — ABNORMAL HIGH (ref 0.40–1.20)
GFR: 46.51 mL/min — ABNORMAL LOW (ref >60.00)
Glucose, Bld: 81 mg/dL (ref 70–99)
POTASSIUM: 4.5 meq/L (ref 3.5–5.1)
SODIUM: 140 meq/L (ref 135–145)

## 2016-02-15 LAB — CBC WITH DIFFERENTIAL/PLATELET
BASOS ABS: 0 10*3/uL (ref 0.0–0.1)
Basophils Relative: 0.5 % (ref 0.0–3.0)
EOS ABS: 0.4 10*3/uL (ref 0.0–0.7)
Eosinophils Relative: 5.6 % — ABNORMAL HIGH (ref 0.0–5.0)
HEMATOCRIT: 34.8 % — AB (ref 36.0–46.0)
Hemoglobin: 11.6 g/dL — ABNORMAL LOW (ref 12.0–15.0)
LYMPHS ABS: 2.7 10*3/uL (ref 0.7–4.0)
LYMPHS PCT: 39.4 % (ref 12.0–46.0)
MCHC: 33.3 g/dL (ref 30.0–36.0)
MCV: 87.3 fl (ref 78.0–100.0)
MONO ABS: 0.6 10*3/uL (ref 0.1–1.0)
Monocytes Relative: 8.1 % (ref 3.0–12.0)
NEUTROS ABS: 3.2 10*3/uL (ref 1.4–7.7)
NEUTROS PCT: 46.4 % (ref 43.0–77.0)
PLATELETS: 234 10*3/uL (ref 150.0–400.0)
RBC: 3.99 Mil/uL (ref 3.87–5.11)
RDW: 13.6 % (ref 11.5–15.5)
WBC: 6.9 10*3/uL (ref 4.0–10.5)

## 2016-02-15 LAB — LIPID PANEL
Cholesterol: 167 mg/dL (ref 0–200)
HDL: 67.9 mg/dL (ref >39.00)
LDL Cholesterol: 90 mg/dL (ref 0–99)
NONHDL: 99.25
Total CHOL/HDL Ratio: 2
Triglycerides: 45 mg/dL (ref 0.0–149.0)
VLDL: 9 mg/dL (ref 0.0–40.0)

## 2016-02-15 LAB — TSH: TSH: 2.27 u[IU]/mL (ref 0.35–4.50)

## 2016-02-15 LAB — HEMOGLOBIN A1C: Hgb A1c MFr Bld: 6.5 % (ref 4.6–6.5)

## 2016-02-18 ENCOUNTER — Encounter: Payer: Self-pay | Admitting: Internal Medicine

## 2016-02-19 ENCOUNTER — Other Ambulatory Visit (INDEPENDENT_AMBULATORY_CARE_PROVIDER_SITE_OTHER): Payer: Medicare Other

## 2016-02-19 ENCOUNTER — Encounter: Payer: Self-pay | Admitting: Internal Medicine

## 2016-02-19 ENCOUNTER — Ambulatory Visit (INDEPENDENT_AMBULATORY_CARE_PROVIDER_SITE_OTHER): Payer: Medicare Other | Admitting: Internal Medicine

## 2016-02-19 VITALS — BP 132/80 | HR 74 | Temp 98.2°F | Resp 16 | Ht 62.0 in | Wt 157.0 lb

## 2016-02-19 DIAGNOSIS — E118 Type 2 diabetes mellitus with unspecified complications: Secondary | ICD-10-CM

## 2016-02-19 DIAGNOSIS — J0101 Acute recurrent maxillary sinusitis: Secondary | ICD-10-CM | POA: Diagnosis not present

## 2016-02-19 DIAGNOSIS — D539 Nutritional anemia, unspecified: Secondary | ICD-10-CM | POA: Diagnosis not present

## 2016-02-19 LAB — VITAMIN B12: Vitamin B-12: 426 pg/mL (ref 211–911)

## 2016-02-19 LAB — URINALYSIS, ROUTINE W REFLEX MICROSCOPIC
BILIRUBIN URINE: NEGATIVE
HGB URINE DIPSTICK: NEGATIVE
Ketones, ur: NEGATIVE
NITRITE: NEGATIVE
RBC / HPF: NONE SEEN (ref 0–?)
Specific Gravity, Urine: 1.015 (ref 1.000–1.030)
Total Protein, Urine: NEGATIVE
URINE GLUCOSE: NEGATIVE
Urobilinogen, UA: 0.2 (ref 0.0–1.0)
pH: 5.5 (ref 5.0–8.0)

## 2016-02-19 LAB — CBC WITH DIFFERENTIAL/PLATELET
BASOS PCT: 0.3 % (ref 0.0–3.0)
Basophils Absolute: 0 10*3/uL (ref 0.0–0.1)
EOS ABS: 0.2 10*3/uL (ref 0.0–0.7)
EOS PCT: 3.4 % (ref 0.0–5.0)
HCT: 37.2 % (ref 36.0–46.0)
HEMOGLOBIN: 12.3 g/dL (ref 12.0–15.0)
LYMPHS ABS: 2.6 10*3/uL (ref 0.7–4.0)
Lymphocytes Relative: 40.7 % (ref 12.0–46.0)
MCHC: 33.2 g/dL (ref 30.0–36.0)
MCV: 87.7 fl (ref 78.0–100.0)
MONO ABS: 0.5 10*3/uL (ref 0.1–1.0)
Monocytes Relative: 8.4 % (ref 3.0–12.0)
NEUTROS PCT: 47.2 % (ref 43.0–77.0)
Neutro Abs: 3 10*3/uL (ref 1.4–7.7)
Platelets: 241 10*3/uL (ref 150.0–400.0)
RBC: 4.24 Mil/uL (ref 3.87–5.11)
RDW: 14.1 % (ref 11.5–15.5)
WBC: 6.4 10*3/uL (ref 4.0–10.5)

## 2016-02-19 LAB — IBC PANEL
IRON: 91 ug/dL (ref 42–145)
Saturation Ratios: 25.9 % (ref 20.0–50.0)
TRANSFERRIN: 251 mg/dL (ref 212.0–360.0)

## 2016-02-19 LAB — MICROALBUMIN / CREATININE URINE RATIO
Creatinine,U: 148.2 mg/dL
MICROALB UR: 0.7 mg/dL (ref 0.0–1.9)
MICROALB/CREAT RATIO: 0.5 mg/g (ref 0.0–30.0)

## 2016-02-19 LAB — FERRITIN: Ferritin: 52.3 ng/mL (ref 10.0–291.0)

## 2016-02-19 LAB — FOLATE: Folate: 12.6 ng/mL (ref >5.9)

## 2016-02-19 MED ORDER — CEFDINIR 300 MG PO CAPS: 300.0000 mg | ORAL_CAPSULE | Freq: Two times a day (BID) | ORAL | Status: AC

## 2016-02-19 NOTE — Patient Instructions (Signed)

## 2016-02-19 NOTE — Progress Notes (Signed)
Pre visit review using our clinic review tool, if applicable. No additional management support is needed unless otherwise documented below in the visit note. 

## 2016-02-19 NOTE — Progress Notes (Signed)
Subjective:  Patient ID: Sarah [name], female    DOB: 20-Jan-1941  Age: 75 y.o. MRN: [id number]  CC: Hypertension; Diabetes; Sinusitis; and Anemia   HPI Sarah [name] presents for f/up on recent labs.  She was mildly anemic and complains of tingling in both lower arms. She denies numbness, blood loss, fatigue, SOB, bruising, and edema. Her blood sugars have been well controlled. Her renal function is stable, she avoids nephrotoxic agents. She also complains of left facial pain and think, yellow nasal phlegm.   Outpatient Prescriptions Prior to Visit  Medication Sig Dispense Refill  . aspirin 81 MG tablet Take 1 tablet (81 mg total) by mouth daily. 30 tablet 6  . atorvastatin (LIPITOR) 10 MG tablet Take 1 tablet (10 mg total) by mouth daily. 90 tablet 3  . azelastine (ASTELIN) 0.1 % nasal spray Place 2 sprays into both nostrils 2 (two) times daily. Use in each nostril as directed 30 mL 11  . diltiazem (TIAZAC) 300 MG 24 hr capsule Take 1 capsule (300 mg total) by mouth daily before breakfast. 90 capsule 3  . hydrochlorothiazide (MICROZIDE) 12.5 MG capsule TAKE 1 (12.5MG ) BY MOUTH DAILY 90 capsule 1  . Ipratropium-Albuterol (COMBIVENT RESPIMAT) 20-100 MCG/ACT AERS respimat Inhale 1 puff into the lungs every 6 (six) hours as needed for wheezing. 4 g 11  . levocetirizine (XYZAL) 5 MG tablet Take 1 tablet (5 mg total) by mouth every evening. 90 tablet 3  . losartan (COZAAR) 100 MG tablet Take 1 tablet (100 mg total) by mouth daily after lunch. 90 tablet 3  . metoprolol (LOPRESSOR) 50 MG tablet Take 1 tablet (50 mg total) by mouth daily. 90 tablet 3  . mometasone (NASONEX) 50 MCG/ACT nasal spray Place 4 sprays into the nose daily. 17 g 12  . montelukast (SINGULAIR) 10 MG tablet Take 1 tablet (10 mg total) by mouth at bedtime. 30 tablet 11  . traMADol (ULTRAM) 50 MG tablet Take 1 tablet (50 mg total) by mouth every 8 (eight) hours as needed. 30 tablet 0  . zolpidem (AMBIEN) 10 MG tablet Take  1 tablet (10 mg total) by mouth at bedtime as needed. 90 tablet 1  . HYDROcodone-homatropine (HYCODAN) 5-1.5 MG/5ML syrup Take 5 mLs by mouth every 8 (eight) hours as needed for cough. 120 mL 0   No facility-administered medications prior to visit.    ROS Review of Systems  Constitutional: Negative.  Negative for fever, chills, diaphoresis, appetite change and fatigue.  HENT: Positive for congestion, postnasal drip, rhinorrhea and sinus pressure. Negative for facial swelling, sneezing, sore throat and trouble swallowing.   Eyes: Negative.   Respiratory: Negative.  Negative for cough, choking, chest tightness, shortness of breath and stridor.   Cardiovascular: Negative.  Negative for chest pain, palpitations and leg swelling.  Gastrointestinal: Negative.  Negative for nausea, vomiting, abdominal pain, diarrhea, constipation and blood in stool.  Endocrine: Negative.   Genitourinary: Negative.   Musculoskeletal: Negative.  Negative for back pain, arthralgias, neck pain and neck stiffness.  Skin: Negative.   Allergic/Immunologic: Negative.   Neurological: Negative.  Negative for dizziness, tremors, syncope, light-headedness, numbness and headaches.  Hematological: Negative.  Negative for adenopathy. Does not bruise/bleed easily.  Psychiatric/Behavioral: Negative.     Objective:  BP 132/80 mmHg  Pulse 74  Temp(Src) 98.2 F (36.8 C) (Oral)  Resp 16  Ht 5\' 2"  (1.575 m)  Wt 157 lb (71.215 kg)  BMI 28.71 kg/m2  SpO2 99%  BP Readings from Last  3 Encounters:  02/19/16 132/80  01/24/16 158/60  06/05/15 136/70    Wt Readings from Last 3 Encounters:  02/19/16 157 lb (71.215 kg)  01/24/16 157 lb (71.215 kg)  06/05/15 148 lb (67.132 kg)    Physical Exam  Constitutional: She is oriented to person, place, and time. No distress.  HENT:  Nose: No mucosal edema or rhinorrhea. Right sinus exhibits no maxillary sinus tenderness and no frontal sinus tenderness. Left sinus exhibits maxillary  sinus tenderness. Left sinus exhibits no frontal sinus tenderness.  Mouth/Throat: Oropharynx is clear and moist and mucous membranes are normal. Mucous membranes are not pale, not dry and not cyanotic. No oropharyngeal exudate, posterior oropharyngeal edema, posterior oropharyngeal erythema or tonsillar abscesses.  Eyes: Conjunctivae are normal. Right eye exhibits no discharge. Left eye exhibits no discharge. No scleral icterus.  Neck: Normal range of motion. Neck supple. No JVD present. No tracheal deviation present. No thyromegaly present.  Cardiovascular: Normal rate, regular rhythm, normal heart sounds and intact distal pulses.  Exam reveals no gallop and no friction rub.   No murmur heard. Pulmonary/Chest: Effort normal and breath sounds normal. No stridor. No respiratory distress. She has no wheezes. She has no rales. She exhibits no tenderness.  Abdominal: Soft. Bowel sounds are normal. She exhibits no distension and no mass. There is no tenderness. There is no rebound and no guarding.  Musculoskeletal: Normal range of motion. She exhibits no edema or tenderness.  Lymphadenopathy:    She has no cervical adenopathy.  Neurological: She is oriented to person, place, and time. She has normal strength. She displays no atrophy and normal reflexes. No cranial nerve deficit or sensory deficit. She exhibits normal muscle tone. She displays a negative Romberg sign. She displays no seizure activity. Coordination and gait normal.  Skin: Skin is warm and dry. No rash noted. She is not diaphoretic. No erythema. No pallor.  Vitals reviewed.   Lab Results  Component Value Date   WBC 6.4 02/19/2016   HGB 12.3 02/19/2016   HCT 37.2 02/19/2016   PLT 241.0 02/19/2016   GLUCOSE 81 02/15/2016   CHOL 167 02/15/2016   TRIG 45.0 02/15/2016   HDL 67.90 02/15/2016   LDLCALC 90 02/15/2016   ALT 19 08/14/2014   AST 24 08/14/2014   NA 140 02/15/2016   K 4.5 02/15/2016   CL 108 02/15/2016   CREATININE 1.42*  02/15/2016   BUN 34* 02/15/2016   CO2 24 02/15/2016   TSH 2.27 02/15/2016   HGBA1C 6.5 02/15/2016   MICROALBUR 0.7 02/19/2016    Dg Chest 2 View  01/24/2016  CLINICAL DATA:  Patient with cough for 7 days. EXAM: CHEST  2 VIEW COMPARISON:  Chest radiograph 01/07/2013. FINDINGS: Normal cardiac and mediastinal contours. No consolidative pulmonary opacities. No pleural effusion or pneumothorax. Regional skeleton is unremarkable. IMPRESSION: No active cardiopulmonary disease. Electronically Signed   By: Lovey Newcomer M.D.   On: 01/24/2016 16:57    Assessment & Plan:   Asteria was seen today for hypertension, diabetes, sinusitis and anemia.  Diagnoses and all orders for this visit:  Acute recurrent maxillary sinusitis- will treat the infection with a ceph antibiotic -     cefdinir (OMNICEF) 300 MG capsule; Take 1 capsule (300 mg total) by mouth 2 (two) times daily.  Deficiency anemia- the anemia has resolved and her vit levels are normal -     CBC with Differential/Platelet; Future -     IBC panel; Future -     Vitamin  B12; Future -     Ferritin; Future -     Folate; Future  Type 2 diabetes mellitus with complication, without long-term current use of insulin ([place])- her blood sugars are adequately well controlled -     Urinalysis, Routine w reflex microscopic (not at [facility]); Future -     Microalbumin / creatinine urine ratio; Future  I have discontinued Ms. Dobratz's HYDROcodone-homatropine. I am also having her start on cefdinir. Additionally, I am having her maintain her aspirin, traMADol, diltiazem, mometasone, azelastine, metoprolol, zolpidem, losartan, hydrochlorothiazide, atorvastatin, levocetirizine, Ipratropium-Albuterol, and montelukast.  Meds ordered this encounter  Medications  . cefdinir (OMNICEF) 300 MG capsule    Sig: Take 1 capsule (300 mg total) by mouth 2 (two) times daily.    Dispense:  20 capsule    Refill:  1     Follow-up: Return if symptoms worsen or fail to  improve.  Scarlette Calico, MD

## 2016-04-18 ENCOUNTER — Other Ambulatory Visit: Payer: Self-pay

## 2016-04-18 DIAGNOSIS — I10 Essential (primary) hypertension: Secondary | ICD-10-CM

## 2016-04-18 MED ORDER — DILTIAZEM HCL ER BEADS 300 MG PO CP24: 300.0000 mg | ORAL_CAPSULE | Freq: Every day | ORAL | Status: DC

## 2016-04-21 DIAGNOSIS — H11139 Conjunctival pigmentations, unspecified eye: Secondary | ICD-10-CM | POA: Diagnosis not present

## 2016-04-21 DIAGNOSIS — H401111 Primary open-angle glaucoma, right eye, mild stage: Secondary | ICD-10-CM | POA: Diagnosis not present

## 2016-04-21 DIAGNOSIS — H01003 Unspecified blepharitis right eye, unspecified eyelid: Secondary | ICD-10-CM | POA: Diagnosis not present

## 2016-04-21 DIAGNOSIS — H401121 Primary open-angle glaucoma, left eye, mild stage: Secondary | ICD-10-CM | POA: Diagnosis not present

## 2016-04-21 DIAGNOSIS — H1013 Acute atopic conjunctivitis, bilateral: Secondary | ICD-10-CM | POA: Diagnosis not present

## 2016-04-21 DIAGNOSIS — H11829 Conjunctivochalasis, unspecified eye: Secondary | ICD-10-CM | POA: Diagnosis not present

## 2016-05-05 DIAGNOSIS — H401111 Primary open-angle glaucoma, right eye, mild stage: Secondary | ICD-10-CM | POA: Diagnosis not present

## 2016-05-19 DIAGNOSIS — H401121 Primary open-angle glaucoma, left eye, mild stage: Secondary | ICD-10-CM | POA: Diagnosis not present

## 2016-06-19 DIAGNOSIS — H40051 Ocular hypertension, right eye: Secondary | ICD-10-CM | POA: Diagnosis not present

## 2016-06-19 DIAGNOSIS — H401111 Primary open-angle glaucoma, right eye, mild stage: Secondary | ICD-10-CM | POA: Diagnosis not present

## 2016-06-19 DIAGNOSIS — H401121 Primary open-angle glaucoma, left eye, mild stage: Secondary | ICD-10-CM | POA: Diagnosis not present

## 2016-07-22 ENCOUNTER — Ambulatory Visit (INDEPENDENT_AMBULATORY_CARE_PROVIDER_SITE_OTHER): Payer: Medicare Other | Admitting: Internal Medicine

## 2016-07-22 ENCOUNTER — Encounter: Payer: Self-pay | Admitting: Internal Medicine

## 2016-07-22 VITALS — BP 134/72 | HR 81 | Temp 98.6°F | Resp 16 | Ht 62.0 in | Wt 152.5 lb

## 2016-07-22 DIAGNOSIS — Z23 Encounter for immunization: Secondary | ICD-10-CM

## 2016-07-22 DIAGNOSIS — J4521 Mild intermittent asthma with (acute) exacerbation: Secondary | ICD-10-CM

## 2016-07-22 DIAGNOSIS — J988 Other specified respiratory disorders: Secondary | ICD-10-CM

## 2016-07-22 DIAGNOSIS — J22 Unspecified acute lower respiratory infection: Secondary | ICD-10-CM | POA: Insufficient documentation

## 2016-07-22 MED ORDER — METHYLPREDNISOLONE ACETATE 80 MG/ML IJ SUSP
80.0000 mg | Freq: Once | INTRAMUSCULAR | Status: AC
  Administered 2016-07-22: 80 mg via INTRAMUSCULAR

## 2016-07-22 MED ORDER — CEFDINIR 300 MG PO CAPS: 300.0000 mg | ORAL_CAPSULE | Freq: Two times a day (BID) | ORAL | 1 refills | Status: DC

## 2016-07-22 MED ORDER — MOMETASONE FURO-FORMOTEROL FUM 200-5 MCG/ACT IN AERO: 2.0000 | INHALATION_SPRAY | Freq: Two times a day (BID) | RESPIRATORY_TRACT | 3 refills | Status: DC

## 2016-07-22 MED ORDER — ALBUTEROL SULFATE HFA 108 (90 BASE) MCG/ACT IN AERS: 2.0000 | INHALATION_SPRAY | Freq: Four times a day (QID) | RESPIRATORY_TRACT | 2 refills | Status: DC | PRN

## 2016-07-22 MED ORDER — HYDROCODONE-HOMATROPINE 5-1.5 MG/5ML PO SYRP: 5.0000 mL | ORAL_SOLUTION | Freq: Three times a day (TID) | ORAL | 0 refills | Status: DC | PRN

## 2016-07-22 NOTE — Patient Instructions (Signed)

## 2016-07-22 NOTE — Progress Notes (Signed)
Subjective:  Patient ID: Sarah [name], female    DOB: 11-Apr-1941  Age: 75 y.o. MRN: [id number]  CC: Cough   HPI Sarah [name] presents for a one-week history of cough and runny nose that's productive of yellow phlegm. She also complains of shortness of breath and wheezing. She has not been using her Combivent inhaler because she tells me it was too expensive. She she is also had a sore throat but she denies fever, chills, night sweats, or hemoptysis.  Outpatient Medications Prior to Visit  Medication Sig Dispense Refill  . aspirin 81 MG tablet Take 1 tablet (81 mg total) by mouth daily. 30 tablet 6  . atorvastatin (LIPITOR) 10 MG tablet Take 1 tablet (10 mg total) by mouth daily. 90 tablet 3  . azelastine (ASTELIN) 0.1 % nasal spray Place 2 sprays into both nostrils 2 (two) times daily. Use in each nostril as directed 30 mL 11  . diltiazem (TIAZAC) 300 MG 24 hr capsule Take 1 capsule (300 mg total) by mouth daily before breakfast. 90 capsule 3  . hydrochlorothiazide (MICROZIDE) 12.5 MG capsule TAKE 1 (12.5MG ) BY MOUTH DAILY 90 capsule 1  . losartan (COZAAR) 100 MG tablet Take 1 tablet (100 mg total) by mouth daily after lunch. 90 tablet 3  . metoprolol (LOPRESSOR) 50 MG tablet Take 1 tablet (50 mg total) by mouth daily. 90 tablet 3  . mometasone (NASONEX) 50 MCG/ACT nasal spray Place 4 sprays into the nose daily. 17 g 12  . zolpidem (AMBIEN) 10 MG tablet Take 1 tablet (10 mg total) by mouth at bedtime as needed. 90 tablet 1  . levocetirizine (XYZAL) 5 MG tablet Take 1 tablet (5 mg total) by mouth every evening. (Patient not taking: Reported on 07/22/2016) 90 tablet 3  . montelukast (SINGULAIR) 10 MG tablet Take 1 tablet (10 mg total) by mouth at bedtime. 30 tablet 11  . Ipratropium-Albuterol (COMBIVENT RESPIMAT) 20-100 MCG/ACT AERS respimat Inhale 1 puff into the lungs every 6 (six) hours as needed for wheezing. (Patient not taking: Reported on 07/22/2016) 4 g 11  . traMADol (ULTRAM) 50 MG  tablet Take 1 tablet (50 mg total) by mouth every 8 (eight) hours as needed. (Patient not taking: Reported on 07/22/2016) 30 tablet 0   No facility-administered medications prior to visit.     ROS Review of Systems  Constitutional: Negative for activity change, appetite change, chills, diaphoresis, fatigue, fever and unexpected weight change.  HENT: Positive for congestion, postnasal drip, rhinorrhea, sinus pressure and sore throat. Negative for facial swelling, sneezing, tinnitus and trouble swallowing.   Eyes: Negative.  Negative for visual disturbance.  Respiratory: Positive for cough, shortness of breath and wheezing. Negative for apnea, choking, chest tightness and stridor.   Cardiovascular: Negative.  Negative for chest pain, palpitations and leg swelling.  Gastrointestinal: Negative.  Negative for abdominal pain, constipation, diarrhea, nausea and vomiting.  Endocrine: Negative.   Genitourinary: Negative.   Musculoskeletal: Negative.  Negative for back pain, myalgias and neck pain.  Skin: Negative.  Negative for color change and rash.  Allergic/Immunologic: Negative.   Neurological: Negative.  Negative for dizziness, tremors, weakness, numbness and headaches.  Hematological: Negative.  Negative for adenopathy. Does not bruise/bleed easily.  Psychiatric/Behavioral: Negative.     Objective:  BP 134/72 (BP Location: Left Arm, Patient Position: Sitting, Cuff Size: Normal)   Pulse 81   Temp 98.6 F (37 C) (Oral)   Resp 16   Ht 5\' 2"  (1.575 m)   Wt  152 lb 8 oz (69.2 kg)   SpO2 98%   BMI 27.89 kg/m   BP Readings from Last 3 Encounters:  07/22/16 134/72  02/19/16 132/80  01/24/16 (!) 158/60    Wt Readings from Last 3 Encounters:  07/22/16 152 lb 8 oz (69.2 kg)  02/19/16 157 lb (71.2 kg)  01/24/16 157 lb (71.2 kg)    Physical Exam  Constitutional: She is oriented to person, place, and time. No distress.  HENT:  Mouth/Throat: Oropharynx is clear and moist. No  oropharyngeal exudate.  Eyes: Conjunctivae are normal. Right eye exhibits no discharge. Left eye exhibits no discharge. No scleral icterus.  Neck: Normal range of motion. Neck supple. No JVD present. No tracheal deviation present. No thyromegaly present.  Cardiovascular: Normal rate, regular rhythm, normal heart sounds and intact distal pulses.  Exam reveals no gallop and no friction rub.   No murmur heard. Pulmonary/Chest: Effort normal. No accessory muscle usage or stridor. No tachypnea. No respiratory distress. She has no decreased breath sounds. She has no wheezes. She has rhonchi in the right upper field, the right middle field, the left upper field and the left middle field. She has no rales.  There are diffuse, bilateral expiratory rhonchi in the mid and upper zones  Abdominal: Soft. Bowel sounds are normal. She exhibits no distension and no mass. There is no tenderness. There is no rebound and no guarding.  Musculoskeletal: Normal range of motion. She exhibits no edema, tenderness or deformity.  Lymphadenopathy:    She has no cervical adenopathy.  Neurological: She is oriented to person, place, and time.  Skin: Skin is warm and dry. No rash noted. She is not diaphoretic. No erythema. No pallor.  Vitals reviewed.   Lab Results  Component Value Date   WBC 6.4 02/19/2016   HGB 12.3 02/19/2016   HCT 37.2 02/19/2016   PLT 241.0 02/19/2016   GLUCOSE 81 02/15/2016   CHOL 167 02/15/2016   TRIG 45.0 02/15/2016   HDL 67.90 02/15/2016   LDLCALC 90 02/15/2016   ALT 19 08/14/2014   AST 24 08/14/2014   NA 140 02/15/2016   K 4.5 02/15/2016   CL 108 02/15/2016   CREATININE 1.42 (H) 02/15/2016   BUN 34 (H) 02/15/2016   CO2 24 02/15/2016   TSH 2.27 02/15/2016   HGBA1C 6.5 02/15/2016   MICROALBUR 0.7 02/19/2016    Dg Chest 2 View  Result Date: 01/24/2016 CLINICAL DATA:  Patient with cough for 7 days. EXAM: CHEST  2 VIEW COMPARISON:  Chest radiograph 01/07/2013. FINDINGS: Normal cardiac  and mediastinal contours. No consolidative pulmonary opacities. No pleural effusion or pneumothorax. Regional skeleton is unremarkable. IMPRESSION: No active cardiopulmonary disease. Electronically Signed   By: Lovey Newcomer M.D.   On: 01/24/2016 16:57    Assessment & Plan:   Lachlyn was seen today for cough.  Diagnoses and all orders for this visit:  Asthma with acute exacerbation, mild intermittent- she is having a moderately severe exacerbation so I gave her an injection of Depo-Medrol to reduce his symptoms and inflammation, will change her inhaler to a LABA/ICS and the short acting beta agonist as well. -     mometasone-formoterol (DULERA) 200-5 MCG/ACT AERO; Inhale 2 puffs into the lungs 2 (two) times daily. -     albuterol (PROVENTIL HFA;VENTOLIN HFA) 108 (90 Base) MCG/ACT inhaler; Inhale 2 puffs into the lungs every 6 (six) hours as needed for wheezing or shortness of breath. -     methylPREDNISolone acetate (DEPO-MEDROL)  injection 80 mg; Inject 1 mL (80 mg total) into the muscle once.  Lower resp. tract infection- I will treat the infection with Cefdinir and offered her Hycodan for symptom relief. -     cefdinir (OMNICEF) 300 MG capsule; Take 1 capsule (300 mg total) by mouth 2 (two) times daily. -     HYDROcodone-homatropine (HYCODAN) 5-1.5 MG/5ML syrup; Take 5 mLs by mouth every 8 (eight) hours as needed for cough.  Need for prophylactic vaccination and inoculation against influenza -     Flu Vaccine QUAD 36+ mos IM   I have discontinued Ms. Shinsato's traMADol and Ipratropium-Albuterol. I am also having her start on mometasone-formoterol, cefdinir, HYDROcodone-homatropine, and albuterol. Additionally, I am having her maintain her aspirin, mometasone, azelastine, metoprolol, zolpidem, losartan, hydrochlorothiazide, atorvastatin, levocetirizine, montelukast, and diltiazem. We administered methylPREDNISolone acetate.  Meds ordered this encounter  Medications  . mometasone-formoterol  (DULERA) 200-5 MCG/ACT AERO    Sig: Inhale 2 puffs into the lungs 2 (two) times daily.    Dispense:  39 g    Refill:  3  . cefdinir (OMNICEF) 300 MG capsule    Sig: Take 1 capsule (300 mg total) by mouth 2 (two) times daily.    Dispense:  20 capsule    Refill:  1  . HYDROcodone-homatropine (HYCODAN) 5-1.5 MG/5ML syrup    Sig: Take 5 mLs by mouth every 8 (eight) hours as needed for cough.    Dispense:  120 mL    Refill:  0  . albuterol (PROVENTIL HFA;VENTOLIN HFA) 108 (90 Base) MCG/ACT inhaler    Sig: Inhale 2 puffs into the lungs every 6 (six) hours as needed for wheezing or shortness of breath.    Dispense:  1 Inhaler    Refill:  2  . methylPREDNISolone acetate (DEPO-MEDROL) injection 80 mg     Follow-up: Return in about 3 weeks (around 08/12/2016).  Scarlette Calico, MD

## 2016-07-22 NOTE — Progress Notes (Signed)
Pre visit review using our clinic review tool, if applicable. No additional management support is needed unless otherwise documented below in the visit note. 

## 2016-08-01 ENCOUNTER — Telehealth: Payer: Self-pay | Admitting: *Deleted

## 2016-08-01 NOTE — Telephone Encounter (Signed)
Rec'd fax pt requesting refill on her Zolpidem 10 mg 90 day supply. [id number] hours policy on refills will forward to MD for him to address when he retum back in the office...Johny Chess

## 2016-08-04 ENCOUNTER — Other Ambulatory Visit: Payer: Self-pay | Admitting: Internal Medicine

## 2016-08-04 MED ORDER — ZOLPIDEM TARTRATE 10 MG PO TABS: 10.0000 mg | ORAL_TABLET | Freq: Every evening | ORAL | 1 refills | Status: DC | PRN

## 2016-08-04 NOTE — Telephone Encounter (Signed)
RX written 

## 2016-08-05 NOTE — Telephone Encounter (Signed)
Rx faxed to pharmacy  

## 2016-08-06 DIAGNOSIS — H401121 Primary open-angle glaucoma, left eye, mild stage: Secondary | ICD-10-CM | POA: Diagnosis not present

## 2016-08-06 DIAGNOSIS — H01003 Unspecified blepharitis right eye, unspecified eyelid: Secondary | ICD-10-CM | POA: Diagnosis not present

## 2016-08-06 DIAGNOSIS — H401111 Primary open-angle glaucoma, right eye, mild stage: Secondary | ICD-10-CM | POA: Diagnosis not present

## 2016-08-06 DIAGNOSIS — H40051 Ocular hypertension, right eye: Secondary | ICD-10-CM | POA: Diagnosis not present

## 2016-08-25 ENCOUNTER — Telehealth: Payer: Self-pay

## 2016-08-25 MED ORDER — HYDROCHLOROTHIAZIDE 12.5 MG PO CAPS
12.5000 mg | ORAL_CAPSULE | Freq: Every day | ORAL | 1 refills | Status: DC
Start: 2016-08-25 — End: 2017-05-21

## 2016-08-25 NOTE — Telephone Encounter (Signed)
[facility] mail service rq rf of hydrochlorothiazide. erx sent.

## 2016-10-20 ENCOUNTER — Ambulatory Visit (INDEPENDENT_AMBULATORY_CARE_PROVIDER_SITE_OTHER): Payer: Medicare Other | Admitting: Internal Medicine

## 2016-10-20 ENCOUNTER — Encounter: Payer: Self-pay | Admitting: Internal Medicine

## 2016-10-20 VITALS — BP 154/70 | HR 81 | Temp 98.4°F | Resp 16 | Ht 62.0 in | Wt 151.8 lb

## 2016-10-20 DIAGNOSIS — R0789 Other chest pain: Secondary | ICD-10-CM

## 2016-10-20 DIAGNOSIS — K2100 Gastro-esophageal reflux disease with esophagitis, without bleeding: Secondary | ICD-10-CM

## 2016-10-20 DIAGNOSIS — R002 Palpitations: Secondary | ICD-10-CM | POA: Diagnosis not present

## 2016-10-20 DIAGNOSIS — I10 Essential (primary) hypertension: Secondary | ICD-10-CM

## 2016-10-20 DIAGNOSIS — J301 Allergic rhinitis due to pollen: Secondary | ICD-10-CM | POA: Diagnosis not present

## 2016-10-20 DIAGNOSIS — Z6826 Body mass index (BMI) 26.0-26.9, adult: Secondary | ICD-10-CM | POA: Diagnosis not present

## 2016-10-20 DIAGNOSIS — Z01419 Encounter for gynecological examination (general) (routine) without abnormal findings: Secondary | ICD-10-CM | POA: Diagnosis not present

## 2016-10-20 DIAGNOSIS — M79602 Pain in left arm: Secondary | ICD-10-CM | POA: Diagnosis not present

## 2016-10-20 DIAGNOSIS — J4540 Moderate persistent asthma, uncomplicated: Secondary | ICD-10-CM

## 2016-10-20 DIAGNOSIS — Z1231 Encounter for screening mammogram for malignant neoplasm of breast: Secondary | ICD-10-CM | POA: Diagnosis not present

## 2016-10-20 MED ORDER — DEXLANSOPRAZOLE 60 MG PO CPDR: 60.0000 mg | DELAYED_RELEASE_CAPSULE | Freq: Every day | ORAL | 3 refills | Status: DC

## 2016-10-20 MED ORDER — AZELASTINE HCL 0.1 % NA SOLN: 2.0000 | Freq: Two times a day (BID) | NASAL | 11 refills | Status: DC

## 2016-10-20 MED ORDER — DM-GUAIFENESIN ER 30-600 MG PO TB12: 2.0000 | ORAL_TABLET | Freq: Two times a day (BID) | ORAL | 11 refills | Status: DC | PRN

## 2016-10-20 MED ORDER — MOMETASONE FUROATE 50 MCG/ACT NA SUSP: 4.0000 | Freq: Every day | NASAL | 12 refills | Status: DC

## 2016-10-20 NOTE — Progress Notes (Signed)
Subjective:  Patient ID: Sarah [name], female    DOB: [birth year]  Age: 76 y.o. MRN: [id number]  CC: Cough and Gastroesophageal Reflux   HPI Sarah [name] presents for a work in appointment. Sarah [name] was seen here by her gynecologist for concerns about chest pain for about 2-3 weeks. Sarah [name] describes a burning sensation under her sternum. The sensation in her sternum can radiate to both arms. Sarah [name] has treated it with Tums and has gotten some relief. Sarah [name] also has a mild, intermittent cough that is productive of thick mucus. Sarah [name] complains that most of the mucus seems to be coming from her nose and drips down the back of her nose into her throat. Sarah [name] complains of fatigue and occasional wheezing but denies hemoptysis, DOE, SOB, weight loss, odynophagia, dysphagia, palpitations, or edema. Sarah [name] has not been using the [facility] inhaler or her nasal sprays.  Outpatient Medications Prior to Visit  Medication Sig Dispense Refill  . albuterol (PROVENTIL HFA;VENTOLIN HFA) 108 (90 Base) MCG/ACT inhaler Inhale 2 puffs into the lungs every 6 (six) hours as needed for wheezing or shortness of breath. 1 Inhaler 2  . aspirin 81 MG tablet Take 1 tablet (81 mg total) by mouth daily. 30 tablet 6  . atorvastatin (LIPITOR) 10 MG tablet Take 1 tablet (10 mg total) by mouth daily. 90 tablet 3  . diltiazem (TIAZAC) 300 MG 24 hr capsule Take 1 capsule (300 mg total) by mouth daily before breakfast. 90 capsule 3  . hydrochlorothiazide (MICROZIDE) 12.5 MG capsule Take 1 capsule (12.5 mg total) by mouth daily. 90 capsule 1  . losartan (COZAAR) 100 MG tablet Take 1 tablet (100 mg total) by mouth daily after lunch. 90 tablet 3  . metoprolol (LOPRESSOR) 50 MG tablet Take 1 tablet (50 mg total) by mouth daily. 90 tablet 3  . mometasone-formoterol (DULERA) 200-5 MCG/ACT AERO Inhale 2 puffs into the lungs 2 (two) times daily. 39 g 3  . montelukast (SINGULAIR) 10 MG tablet Take 1 tablet (10 mg total) by mouth at bedtime. 30 tablet 11  .  zolpidem (AMBIEN) 10 MG tablet Take 1 tablet (10 mg total) by mouth at bedtime as needed. 90 tablet 1  . azelastine (ASTELIN) 0.1 % nasal spray Place 2 sprays into both nostrils 2 (two) times daily. Use in each nostril as directed 30 mL 11  . HYDROcodone-homatropine (HYCODAN) 5-1.5 MG/5ML syrup Take 5 mLs by mouth every 8 (eight) hours as needed for cough. 120 mL 0  . mometasone (NASONEX) 50 MCG/ACT nasal spray Place 4 sprays into the nose daily. 17 g 12  . levocetirizine (XYZAL) 5 MG tablet Take 1 tablet (5 mg total) by mouth every evening. (Patient not taking: Reported on 10/20/2016) 90 tablet 3  . cefdinir (OMNICEF) 300 MG capsule Take 1 capsule (300 mg total) by mouth 2 (two) times daily. 20 capsule 1   No facility-administered medications prior to visit.     ROS Review of Systems  Constitutional: Positive for fatigue. Negative for activity change, chills, diaphoresis, fever and unexpected weight change.  HENT: Positive for congestion, postnasal drip and rhinorrhea. Negative for facial swelling, sinus pain, sinus pressure, sneezing, sore throat, tinnitus, trouble swallowing and voice change.   Eyes: Negative.   Respiratory: Positive for cough. Negative for choking, chest tightness, shortness of breath, wheezing and stridor.   Cardiovascular: Positive for palpitations. Negative for chest pain and leg swelling.       Sarah [name] complains of a several week history of intermittent  palpitations that occur nearly every day. Sarah [name] describes a sensation of heart racing with anxiety and panic. Sarah [name] does not have any other symptoms related to this.  Gastrointestinal: Negative.  Negative for abdominal pain, constipation, diarrhea, nausea and vomiting.  Endocrine: Negative.   Genitourinary: Negative.   Musculoskeletal: Negative.  Negative for joint swelling and myalgias.  Skin: Negative.  Negative for color change and rash.  Neurological: Negative.  Negative for dizziness, weakness and light-headedness.    Hematological: Negative.  Negative for adenopathy. Does not bruise/bleed easily.  Psychiatric/Behavioral: Negative.     Objective:  BP (!) 154/70 (BP Location: Left Arm, Patient Position: Sitting, Cuff Size: Normal)   Pulse 81   Temp 98.4 F (36.9 C) (Oral)   Resp 16   Ht 5\' 2"  (1.575 m)   Wt 151 lb 12 oz (68.8 kg)   SpO2 99%   BMI 27.76 kg/m   BP Readings from Last 3 Encounters:  10/20/16 (!) 154/70  07/22/16 134/72  02/19/16 132/80    Wt Readings from Last 3 Encounters:  10/20/16 151 lb 12 oz (68.8 kg)  07/22/16 152 lb 8 oz (69.2 kg)  02/19/16 157 lb (71.2 kg)    Physical Exam  Constitutional: Sarah [name] is oriented to person, place, and time. No distress.  HENT:  Mouth/Throat: Oropharynx is clear and moist. No oropharyngeal exudate.  Eyes: Conjunctivae are normal. Right eye exhibits no discharge. Left eye exhibits no discharge. No scleral icterus.  Neck: Normal range of motion. Neck supple. No JVD present. No tracheal deviation present. No thyromegaly present.  Cardiovascular: Normal rate, regular rhythm, normal heart sounds and intact distal pulses.  Exam reveals no gallop and no friction rub.   No murmur heard. EKG -  Sinus  Rhythm  WITHIN NORMAL LIMITS   Pulmonary/Chest: Effort normal and breath sounds normal. No stridor. No respiratory distress. Sarah [name] has no wheezes. Sarah [name] has no rales. Sarah [name] exhibits no tenderness.  Abdominal: Soft. Bowel sounds are normal. Sarah [name] exhibits no distension and no mass. There is no tenderness. There is no rebound and no guarding.  Musculoskeletal: Normal range of motion. Sarah [name] exhibits no edema, tenderness or deformity.  Lymphadenopathy:    Sarah [name] has no cervical adenopathy.  Neurological: Sarah [name] is oriented to person, place, and time.  Skin: Skin is warm and dry. No rash noted. Sarah [name] is not diaphoretic. No erythema. No pallor.    Lab Results  Component Value Date   WBC 6.4 02/19/2016   HGB 12.3 02/19/2016   HCT 37.2 02/19/2016   PLT 241.0  02/19/2016   GLUCOSE 81 02/15/2016   CHOL 167 02/15/2016   TRIG 45.0 02/15/2016   HDL 67.90 02/15/2016   LDLCALC 90 02/15/2016   ALT 19 08/14/2014   AST 24 08/14/2014   NA 140 02/15/2016   K 4.5 02/15/2016   CL 108 02/15/2016   CREATININE 1.42 (H) 02/15/2016   BUN 34 (H) 02/15/2016   CO2 24 02/15/2016   TSH 2.27 02/15/2016   HGBA1C 6.5 02/15/2016   MICROALBUR 0.7 02/19/2016    Dg Chest 2 View  Result Date: 01/24/2016 CLINICAL DATA:  Patient with cough for 7 days. EXAM: CHEST  2 VIEW COMPARISON:  Chest radiograph 01/07/2013. FINDINGS: Normal cardiac and mediastinal contours. No consolidative pulmonary opacities. No pleural effusion or pneumothorax. Regional skeleton is unremarkable. IMPRESSION: No active cardiopulmonary disease. Electronically Signed   By: Lovey Newcomer M.D.   On: 01/24/2016 16:57    Assessment & Plan:   Sarah [name] was seen today for  cough and gastroesophageal reflux.  Diagnoses and all orders for this visit:  Left arm pain -     EKG 12-Lead  Gastroesophageal reflux disease with esophagitis- I've asked her to start taking a PPI to treat this. -     dexlansoprazole (DEXILANT) 60 MG capsule; Take 1 capsule (60 mg total) by mouth daily.  Other chest pain- her chest pain is not consistent with angina, it is consistent with GERD, her EKG is normal. Will treat the GERD aggressively. -     dexlansoprazole (DEXILANT) 60 MG capsule; Take 1 capsule (60 mg total) by mouth daily.  Chronic seasonal allergic rhinitis due to pollen- I've asked her to be more compliant with her nasal sprays to help control her symptoms. Will also try Mucinex DM as needed. -     azelastine (ASTELIN) 0.1 % nasal spray; Place 2 sprays into both nostrils 2 (two) times daily. Use in each nostril as directed -     mometasone (NASONEX) 50 MCG/ACT nasal spray; Place 4 sprays into the nose daily. -     dextromethorphan-guaiFENesin (MUCINEX DM) 30-600 MG 12hr tablet; Take 2 tablets by mouth 2 (two) times  daily as needed for cough.  Palpitations- I have ordered a Holter monitor to see if Sarah [name] has a significant dysrhythmia such as A. fib, a flutter, or SVT. -     Holter monitor - 48 hour; Future  Asthma in adult, moderate persistent, uncomplicated- I've asked her to be more compliant with the [facility] and will offer her Mucinex DM as needed for the cough. -     dextromethorphan-guaiFENesin (MUCINEX DM) 30-600 MG 12hr tablet; Take 2 tablets by mouth 2 (two) times daily as needed for cough.   I have discontinued Ms. Schueler's cefdinir and HYDROcodone-homatropine. I am also having her start on dexlansoprazole and dextromethorphan-guaiFENesin. Additionally, I am having her maintain her aspirin, metoprolol, losartan, atorvastatin, levocetirizine, montelukast, diltiazem, mometasone-formoterol, albuterol, zolpidem, hydrochlorothiazide, azelastine, and mometasone.  Meds ordered this encounter  Medications  . azelastine (ASTELIN) 0.1 % nasal spray    Sig: Place 2 sprays into both nostrils 2 (two) times daily. Use in each nostril as directed    Dispense:  30 mL    Refill:  11  . mometasone (NASONEX) 50 MCG/ACT nasal spray    Sig: Place 4 sprays into the nose daily.    Dispense:  17 g    Refill:  12  . dexlansoprazole (DEXILANT) 60 MG capsule    Sig: Take 1 capsule (60 mg total) by mouth daily.    Dispense:  90 capsule    Refill:  3  . dextromethorphan-guaiFENesin (MUCINEX DM) 30-600 MG 12hr tablet    Sig: Take 2 tablets by mouth 2 (two) times daily as needed for cough.    Dispense:  120 tablet    Refill:  11     Follow-up: Return in about 6 weeks (around 12/01/2016).  Scarlette Calico, MD

## 2016-10-20 NOTE — Patient Instructions (Signed)
Chest Wall Pain °Chest wall pain is pain in or around the bones and muscles of your chest. Sometimes, an injury causes this pain. Sometimes, the cause may not be known. This pain may take several weeks or longer to get better. °Follow these instructions at home: °Pay attention to any changes in your symptoms. Take these actions to help with your pain: °· Rest as told by your health care provider. °· Avoid activities that cause pain. These include any activities that use your chest muscles or your abdominal and side muscles to lift heavy items. °· If directed, apply ice to the painful area: °¨ Put ice in a plastic bag. °¨ Place a towel between your skin and the bag. °¨ Leave the ice on for 20 minutes, 2-3 times per day. °· Take over-the-counter and prescription medicines only as told by your health care provider. °· Do not use tobacco products, including cigarettes, chewing tobacco, and e-cigarettes. If you need help quitting, ask your health care provider. °· Keep all follow-up visits as told by your health care provider. This is important. °Contact a health care provider if: °· You have a fever. °· Your chest pain becomes worse. °· You have new symptoms. °Get help right away if: °· You have nausea or vomiting. °· You feel sweaty or light-headed. °· You have a cough with phlegm (sputum) or you cough up blood. °· You develop shortness of breath. °This information is not intended to replace advice given to you by your health care provider. Make sure you discuss any questions you have with your health care provider. °Document Released: 11/17/2005 Document Revised: 03/27/2016 Document Reviewed: 02/12/2015 °Elsevier Interactive Patient Education © 2017 [employer] ° °

## 2016-10-20 NOTE — Progress Notes (Signed)
Pre visit review using our clinic review tool, if applicable. No additional management support is needed unless otherwise documented below in the visit note. 

## 2016-10-22 DIAGNOSIS — Z9114 Patient's other noncompliance with medication regimen: Secondary | ICD-10-CM | POA: Diagnosis not present

## 2016-10-22 DIAGNOSIS — H40051 Ocular hypertension, right eye: Secondary | ICD-10-CM | POA: Diagnosis not present

## 2016-10-22 DIAGNOSIS — H401131 Primary open-angle glaucoma, bilateral, mild stage: Secondary | ICD-10-CM | POA: Diagnosis not present

## 2016-11-29 ENCOUNTER — Telehealth: Payer: Self-pay

## 2016-11-29 NOTE — Telephone Encounter (Signed)
[facility] mail service sent a fax wanting verification of the dose and strength of nasonex. Please confirm and advise. I will call and give MD response.   [id number] rx number [id number] Order number [id number]

## 2016-12-01 ENCOUNTER — Other Ambulatory Visit: Payer: Self-pay | Admitting: Internal Medicine

## 2016-12-01 DIAGNOSIS — J301 Allergic rhinitis due to pollen: Secondary | ICD-10-CM

## 2016-12-01 HISTORY — PX: EYE SURGERY: SHX253

## 2016-12-01 HISTORY — PX: CATARACT EXTRACTION: SUR2

## 2016-12-01 NOTE — Telephone Encounter (Signed)
2 puffs each nostril qd

## 2016-12-02 NOTE — Telephone Encounter (Signed)
Contacted pt and pharmacy.  Rob Emergency planning/management officer) given PCP response and stated understanding.

## 2016-12-05 NOTE — Telephone Encounter (Signed)
Completed [employer] form for a formulary exception for nasonex. Form given to pcp to sign.

## 2016-12-10 NOTE — Telephone Encounter (Signed)
Form has been faxed for determination.

## 2016-12-15 ENCOUNTER — Ambulatory Visit (INDEPENDENT_AMBULATORY_CARE_PROVIDER_SITE_OTHER): Payer: Medicare Other

## 2016-12-15 DIAGNOSIS — R002 Palpitations: Secondary | ICD-10-CM

## 2017-01-14 DIAGNOSIS — H2513 Age-related nuclear cataract, bilateral: Secondary | ICD-10-CM | POA: Diagnosis not present

## 2017-01-14 DIAGNOSIS — H401131 Primary open-angle glaucoma, bilateral, mild stage: Secondary | ICD-10-CM | POA: Diagnosis not present

## 2017-01-14 DIAGNOSIS — H35033 Hypertensive retinopathy, bilateral: Secondary | ICD-10-CM | POA: Diagnosis not present

## 2017-01-14 DIAGNOSIS — H25013 Cortical age-related cataract, bilateral: Secondary | ICD-10-CM | POA: Diagnosis not present

## 2017-01-14 LAB — HM DIABETES EYE EXAM

## 2017-01-16 ENCOUNTER — Telehealth: Payer: Self-pay

## 2017-01-16 NOTE — Telephone Encounter (Signed)
[facility] rq rf for metoprolol. Rq is for a 90 day supply.

## 2017-01-19 ENCOUNTER — Other Ambulatory Visit: Payer: Self-pay | Admitting: Internal Medicine

## 2017-01-19 ENCOUNTER — Encounter: Payer: Self-pay | Admitting: Internal Medicine

## 2017-01-19 DIAGNOSIS — I10 Essential (primary) hypertension: Secondary | ICD-10-CM

## 2017-01-19 MED ORDER — METOPROLOL TARTRATE 50 MG PO TABS: 50.0000 mg | ORAL_TABLET | Freq: Every day | ORAL | 1 refills | Status: DC

## 2017-01-19 NOTE — Progress Notes (Signed)
Results abstracted

## 2017-01-20 NOTE — Telephone Encounter (Signed)
This has been sent. Closing note.  

## 2017-01-26 ENCOUNTER — Other Ambulatory Visit: Payer: Self-pay | Admitting: *Deleted

## 2017-01-26 ENCOUNTER — Other Ambulatory Visit: Payer: Self-pay | Admitting: Internal Medicine

## 2017-01-26 MED ORDER — ZOLPIDEM TARTRATE 10 MG PO TABS: 10.0000 mg | ORAL_TABLET | Freq: Every evening | ORAL | 1 refills | Status: DC | PRN

## 2017-01-26 NOTE — Telephone Encounter (Signed)
rx faxed to [facility] mail order.

## 2017-01-26 NOTE — Telephone Encounter (Signed)
Rec'd call pt states she is needing new rx sent to [facility] mail order on her ambien 10 mg.../lmb

## 2017-01-26 NOTE — Telephone Encounter (Signed)
done

## 2017-03-05 ENCOUNTER — Telehealth: Payer: Self-pay | Admitting: Internal Medicine

## 2017-03-05 DIAGNOSIS — Z794 Long term (current) use of insulin: Secondary | ICD-10-CM

## 2017-03-05 DIAGNOSIS — I10 Essential (primary) hypertension: Secondary | ICD-10-CM

## 2017-03-05 DIAGNOSIS — E1111 Type 2 diabetes mellitus with ketoacidosis with coma: Secondary | ICD-10-CM

## 2017-03-05 MED ORDER — LOSARTAN POTASSIUM 100 MG PO TABS: 100.0000 mg | ORAL_TABLET | Freq: Every day | ORAL | 1 refills | Status: DC

## 2017-03-05 NOTE — Addendum Note (Signed)
Addended by: Aviva Signs M on: 03/05/2017 02:03 PM   Modules accepted: Orders

## 2017-03-05 NOTE — Telephone Encounter (Signed)
Pt needs a refill of Losartan    [facility] [phone] [place]

## 2017-03-05 NOTE — Telephone Encounter (Signed)
erx sent to [facility] mail order.

## 2017-03-31 DIAGNOSIS — B373 Candidiasis of vulva and vagina: Secondary | ICD-10-CM | POA: Diagnosis not present

## 2017-04-08 ENCOUNTER — Other Ambulatory Visit: Payer: Self-pay | Admitting: Internal Medicine

## 2017-04-08 ENCOUNTER — Telehealth: Payer: Self-pay

## 2017-04-08 DIAGNOSIS — I10 Essential (primary) hypertension: Secondary | ICD-10-CM

## 2017-04-08 MED ORDER — DILTIAZEM HCL ER BEADS 300 MG PO CP24: 300.0000 mg | ORAL_CAPSULE | Freq: Every day | ORAL | 0 refills | Status: DC

## 2017-04-08 NOTE — Telephone Encounter (Signed)
[facility] sent rx rq for diltiazem ER 300 mg.  Please advise

## 2017-04-16 DIAGNOSIS — H40053 Ocular hypertension, bilateral: Secondary | ICD-10-CM | POA: Diagnosis not present

## 2017-04-16 DIAGNOSIS — Z9114 Patient's other noncompliance with medication regimen: Secondary | ICD-10-CM | POA: Diagnosis not present

## 2017-04-16 DIAGNOSIS — H401131 Primary open-angle glaucoma, bilateral, mild stage: Secondary | ICD-10-CM | POA: Diagnosis not present

## 2017-04-30 ENCOUNTER — Encounter: Payer: Self-pay | Admitting: Internal Medicine

## 2017-04-30 ENCOUNTER — Ambulatory Visit (INDEPENDENT_AMBULATORY_CARE_PROVIDER_SITE_OTHER): Payer: Medicare Other | Admitting: Internal Medicine

## 2017-04-30 ENCOUNTER — Ambulatory Visit (INDEPENDENT_AMBULATORY_CARE_PROVIDER_SITE_OTHER)
Admission: RE | Admit: 2017-04-30 | Discharge: 2017-04-30 | Disposition: A | Discharge status: Home / Self Care | Payer: Medicare Other | Source: Ambulatory Visit | Attending: Internal Medicine | Admitting: Internal Medicine

## 2017-04-30 VITALS — BP 140/74 | HR 79 | Temp 98.8°F | Resp 20 | Ht 62.0 in | Wt 160.0 lb

## 2017-04-30 DIAGNOSIS — R059 Cough, unspecified: Secondary | ICD-10-CM

## 2017-04-30 DIAGNOSIS — R05 Cough: Secondary | ICD-10-CM

## 2017-04-30 DIAGNOSIS — J301 Allergic rhinitis due to pollen: Secondary | ICD-10-CM | POA: Diagnosis not present

## 2017-04-30 DIAGNOSIS — J4551 Severe persistent asthma with (acute) exacerbation: Secondary | ICD-10-CM | POA: Diagnosis not present

## 2017-04-30 DIAGNOSIS — J4540 Moderate persistent asthma, uncomplicated: Secondary | ICD-10-CM | POA: Diagnosis not present

## 2017-04-30 LAB — POCT EXHALED NITRIC OXIDE: FeNO level (ppb): 111

## 2017-04-30 MED ORDER — FLUTICASONE FUROATE-VILANTEROL 200-25 MCG/INH IN AEPB: 1.0000 | INHALATION_SPRAY | Freq: Every day | RESPIRATORY_TRACT | 3 refills | Status: DC

## 2017-04-30 MED ORDER — MONTELUKAST SODIUM 10 MG PO TABS: 10.0000 mg | ORAL_TABLET | Freq: Every day | ORAL | 3 refills | Status: DC

## 2017-04-30 MED ORDER — HYDROCODONE-HOMATROPINE 5-1.5 MG/5ML PO SYRP: 5.0000 mL | ORAL_SOLUTION | Freq: Three times a day (TID) | ORAL | 0 refills | Status: DC | PRN

## 2017-04-30 MED ORDER — METHYLPREDNISOLONE ACETATE 80 MG/ML IJ SUSP
120.0000 mg | Freq: Once | INTRAMUSCULAR | Status: AC
  Administered 2017-04-30: 120 mg via INTRAMUSCULAR

## 2017-04-30 MED ORDER — METHYLPREDNISOLONE ACETATE 80 MG/ML IJ SUSP: 80.0000 mg | Freq: Once | INTRAMUSCULAR | Status: DC

## 2017-04-30 NOTE — Progress Notes (Signed)
Subjective:  Patient ID: Sarah [name], female    DOB: 08-10-1941  Age: 76 y.o. MRN: [id number]  CC: Cough   HPI Sarah [name] presents for A 2 week history of cough that's productive of thick white phlegm with wheezing and shortness of breath. She stopped using Dulera recently because it was too expensive. She denies chest pain, hemoptysis, night sweats, fever, or chills. She has gotten some symptom relief with the albuterol inhaler. She has not recently been taking Singulair.  Outpatient Medications Prior to Visit  Medication Sig Dispense Refill  . aspirin 81 MG tablet Take 1 tablet (81 mg total) by mouth daily. 30 tablet 6  . atorvastatin (LIPITOR) 10 MG tablet Take 1 tablet (10 mg total) by mouth daily. 90 tablet 3  . azelastine (ASTELIN) 0.1 % nasal spray Place 2 sprays into both nostrils 2 (two) times daily. Use in each nostril as directed 30 mL 11  . dexlansoprazole (DEXILANT) 60 MG capsule Take 1 capsule (60 mg total) by mouth daily. 90 capsule 3  . dextromethorphan-guaiFENesin (MUCINEX DM) 30-600 MG 12hr tablet Take 2 tablets by mouth 2 (two) times daily as needed for cough. 120 tablet 11  . diltiazem (TIAZAC) 300 MG 24 hr capsule Take 1 capsule (300 mg total) by mouth daily before breakfast. 90 capsule 0  . hydrochlorothiazide (MICROZIDE) 12.5 MG capsule Take 1 capsule (12.5 mg total) by mouth daily. 90 capsule 1  . levocetirizine (XYZAL) 5 MG tablet Take 1 tablet (5 mg total) by mouth every evening. 90 tablet 3  . losartan (COZAAR) 100 MG tablet Take 1 tablet (100 mg total) by mouth daily after lunch. 90 tablet 1  . metoprolol (LOPRESSOR) 50 MG tablet Take 1 tablet (50 mg total) by mouth daily. 90 tablet 1  . mometasone (NASONEX) 50 MCG/ACT nasal spray Place 4 sprays into the nose daily. 17 g 12  . zolpidem (AMBIEN) 10 MG tablet Take 1 tablet (10 mg total) by mouth at bedtime as needed. 90 tablet 1  . albuterol (PROVENTIL HFA;VENTOLIN HFA) 108 (90 Base) MCG/ACT inhaler Inhale 2  puffs into the lungs every 6 (six) hours as needed for wheezing or shortness of breath. 1 Inhaler 2  . mometasone-formoterol (DULERA) 200-5 MCG/ACT AERO Inhale 2 puffs into the lungs 2 (two) times daily. 39 g 3  . montelukast (SINGULAIR) 10 MG tablet Take 1 tablet (10 mg total) by mouth at bedtime. 30 tablet 11   No facility-administered medications prior to visit.     ROS Review of Systems  Constitutional: Negative for chills, diaphoresis, fatigue and fever.  HENT: Positive for postnasal drip and rhinorrhea. Negative for facial swelling, sinus pain, sinus pressure, sore throat and trouble swallowing.   Eyes: Negative for visual disturbance.  Respiratory: Positive for cough, shortness of breath and wheezing. Negative for chest tightness and stridor.   Cardiovascular: Negative for chest pain, palpitations and leg swelling.  Gastrointestinal: Negative for abdominal pain, constipation, diarrhea, nausea and vomiting.  Genitourinary: Negative.  Negative for difficulty urinating.  Musculoskeletal: Negative.  Negative for back pain and myalgias.  Skin: Negative.  Negative for color change and rash.  Allergic/Immunologic: Negative.   Neurological: Negative.  Negative for dizziness, weakness and numbness.  Hematological: Negative.  Negative for adenopathy.  Psychiatric/Behavioral: Negative.     Objective:  BP 140/74 (BP Location: Left Arm, Patient Position: Sitting, Cuff Size: Normal)   Pulse 79   Temp 98.8 F (37.1 C) (Oral)   Resp 20   Ht  5\' 2"  (1.575 m)   Wt 160 lb (72.6 kg)   SpO2 94%   BMI 29.26 kg/m   BP Readings from Last 3 Encounters:  04/30/17 140/74  10/20/16 (!) 154/70  07/22/16 134/72    Wt Readings from Last 3 Encounters:  04/30/17 160 lb (72.6 kg)  10/20/16 151 lb 12 oz (68.8 kg)  07/22/16 152 lb 8 oz (69.2 kg)    Physical Exam  Constitutional: She is oriented to person, place, and time.  Non-toxic appearance. She does not have a sickly appearance. She does not  appear ill. No distress.  HENT:  Mouth/Throat: Oropharynx is clear and moist. No oropharyngeal exudate.  Eyes: Conjunctivae are normal. Right eye exhibits no discharge. Left eye exhibits no discharge.  Neck: Normal range of motion. Neck supple. No JVD present. No tracheal deviation present. No thyromegaly present.  Cardiovascular: Normal rate, regular rhythm, normal heart sounds and intact distal pulses.  Exam reveals no gallop and no friction rub.   No murmur heard. Pulmonary/Chest: Effort normal. No accessory muscle usage or stridor. No tachypnea. No respiratory distress. She has no decreased breath sounds. She has wheezes in the right upper field, the right middle field, the right lower field, the left upper field, the left middle field and the left lower field. She has rhonchi in the right upper field, the right middle field, the right lower field, the left upper field, the left middle field and the left lower field. She has no rales. She exhibits no tenderness.  Abdominal: Soft. Bowel sounds are normal. She exhibits no distension and no mass. There is no tenderness. There is no rebound and no guarding.  Musculoskeletal: Normal range of motion. She exhibits no edema or tenderness.  Lymphadenopathy:    She has no cervical adenopathy.  Neurological: She is oriented to person, place, and time.  Skin: Skin is warm and dry. No rash noted. She is not diaphoretic. No erythema. No pallor.  Vitals reviewed.   Lab Results  Component Value Date   WBC 6.4 02/19/2016   HGB 12.3 02/19/2016   HCT 37.2 02/19/2016   PLT 241.0 02/19/2016   GLUCOSE 81 02/15/2016   CHOL 167 02/15/2016   TRIG 45.0 02/15/2016   HDL 67.90 02/15/2016   LDLCALC 90 02/15/2016   ALT 19 08/14/2014   AST 24 08/14/2014   NA 140 02/15/2016   K 4.5 02/15/2016   CL 108 02/15/2016   CREATININE 1.42 (H) 02/15/2016   BUN 34 (H) 02/15/2016   CO2 24 02/15/2016   TSH 2.27 02/15/2016   HGBA1C 6.5 02/15/2016   MICROALBUR 0.7  02/19/2016    Dg Chest 2 View  Result Date: 01/24/2016 CLINICAL DATA:  Patient with cough for 7 days. EXAM: CHEST  2 VIEW COMPARISON:  Chest radiograph 01/07/2013. FINDINGS: Normal cardiac and mediastinal contours. No consolidative pulmonary opacities. No pleural effusion or pneumothorax. Regional skeleton is unremarkable. IMPRESSION: No active cardiopulmonary disease. Electronically Signed   By: Lovey Newcomer M.D.   On: 01/24/2016 16:57    Assessment & Plan:   Sarah [name] was seen today for cough.  Diagnoses and all orders for this visit:  Severe persistent asthma with exacerbation- she is having a moderately severe exacerbation so I gave her a dose of systemic steroids. Will restart the LABA/ICS inhaler, will restart Singulair, will refill the albuterol, -     montelukast (SINGULAIR) 10 MG tablet; Take 1 tablet (10 mg total) by mouth at bedtime.  Asthma in adult, moderate persistent, uncomplicated -  montelukast (SINGULAIR) 10 MG tablet; Take 1 tablet (10 mg total) by mouth at bedtime. -     fluticasone furoate-vilanterol (BREO ELLIPTA) 200-25 MCG/INH AEPB; Inhale 1 puff into the lungs daily.  Non-seasonal allergic rhinitis due to pollen -     montelukast (SINGULAIR) 10 MG tablet; Take 1 tablet (10 mg total) by mouth at bedtime.  Cough- her chest x-ray is negative for mass or infiltrate -     DG Chest 2 View; Future -     HYDROcodone-homatropine (HYCODAN) 5-1.5 MG/5ML syrup; Take 5 mLs by mouth every 8 (eight) hours as needed for cough.   I have discontinued Sarah [name]'s mometasone-formoterol. I am also having her start on fluticasone furoate-vilanterol and HYDROcodone-homatropine. Additionally, I am having her maintain her aspirin, atorvastatin, levocetirizine, hydrochlorothiazide, azelastine, mometasone, dexlansoprazole, dextromethorphan-guaiFENesin, metoprolol tartrate, zolpidem, losartan, diltiazem, montelukast, and albuterol. We administered methylPREDNISolone acetate.  Meds ordered  this encounter  Medications  . montelukast (SINGULAIR) 10 MG tablet    Sig: Take 1 tablet (10 mg total) by mouth at bedtime.    Dispense:  90 tablet    Refill:  3  . fluticasone furoate-vilanterol (BREO ELLIPTA) 200-25 MCG/INH AEPB    Sig: Inhale 1 puff into the lungs daily.    Dispense:  90 each    Refill:  3  . HYDROcodone-homatropine (HYCODAN) 5-1.5 MG/5ML syrup    Sig: Take 5 mLs by mouth every 8 (eight) hours as needed for cough.    Dispense:  120 mL    Refill:  0  . DISCONTD: methylPREDNISolone acetate (DEPO-MEDROL) injection 80 mg  . methylPREDNISolone acetate (DEPO-MEDROL) injection 120 mg  . albuterol (PROVENTIL HFA;VENTOLIN HFA) 108 (90 Base) MCG/ACT inhaler    Sig: Inhale 2 puffs into the lungs every 6 (six) hours as needed for wheezing or shortness of breath.    Dispense:  1 Inhaler    Refill:  3     Follow-up: Return in about 3 weeks (around 05/21/2017).  Scarlette Calico, MD

## 2017-04-30 NOTE — Patient Instructions (Signed)
Cough, Adult Coughing is a reflex that clears your throat and your airways. Coughing helps to heal and protect your lungs. It is normal to cough occasionally, but a cough that happens with other symptoms or lasts a long time may be a sign of a condition that needs treatment. A cough may last only 2-3 weeks (acute), or it may last longer than 8 weeks (chronic). What are the causes? Coughing is commonly caused by:  Breathing in substances that irritate your lungs.  A viral or bacterial respiratory infection.  Allergies.  Asthma.  Postnasal drip.  Smoking.  Acid backing up from the stomach into the esophagus (gastroesophageal reflux).  Certain medicines.  Chronic lung problems, including COPD (or rarely, lung cancer).  Other medical conditions such as heart failure.  Follow these instructions at home: Pay attention to any changes in your symptoms. Take these actions to help with your discomfort:  Take medicines only as told by your health care provider. ? If you were prescribed an antibiotic medicine, take it as told by your health care provider. Do not stop taking the antibiotic even if you start to feel better. ? Talk with your health care provider before you take a cough suppressant medicine.  Drink enough fluid to keep your urine clear or pale yellow.  If the air is dry, use a cold steam vaporizer or humidifier in your bedroom or your home to help loosen secretions.  Avoid anything that causes you to cough at work or at home.  If your cough is worse at night, try sleeping in a semi-upright position.  Avoid cigarette smoke. If you smoke, quit smoking. If you need help quitting, ask your health care provider.  Avoid caffeine.  Avoid alcohol.  Rest as needed.  Contact a health care provider if:  You have new symptoms.  You cough up pus.  Your cough does not get better after 2-3 weeks, or your cough gets worse.  You cannot control your cough with suppressant  medicines and you are losing sleep.  You develop pain that is getting worse or pain that is not controlled with pain medicines.  You have a fever.  You have unexplained weight loss.  You have night sweats. Get help right away if:  You cough up blood.  You have difficulty breathing.  Your heartbeat is very fast. This information is not intended to replace advice given to you by your health care provider. Make sure you discuss any questions you have with your health care provider. Document Released: 05/16/2011 Document Revised: 04/24/2016 Document Reviewed: 01/24/2015 Elsevier Interactive Patient Education  2017 Elsevier Inc.  

## 2017-05-01 MED ORDER — ALBUTEROL SULFATE HFA 108 (90 BASE) MCG/ACT IN AERS: 2.0000 | INHALATION_SPRAY | Freq: Four times a day (QID) | RESPIRATORY_TRACT | 3 refills | Status: DC | PRN

## 2017-05-04 ENCOUNTER — Ambulatory Visit: Payer: Medicare Other | Admitting: Internal Medicine

## 2017-05-14 DIAGNOSIS — Z9114 Patient's other noncompliance with medication regimen: Secondary | ICD-10-CM | POA: Diagnosis not present

## 2017-05-14 DIAGNOSIS — H401131 Primary open-angle glaucoma, bilateral, mild stage: Secondary | ICD-10-CM | POA: Diagnosis not present

## 2017-05-14 DIAGNOSIS — H40053 Ocular hypertension, bilateral: Secondary | ICD-10-CM | POA: Diagnosis not present

## 2017-05-21 ENCOUNTER — Other Ambulatory Visit (INDEPENDENT_AMBULATORY_CARE_PROVIDER_SITE_OTHER): Payer: Medicare Other

## 2017-05-21 ENCOUNTER — Encounter: Payer: Self-pay | Admitting: Internal Medicine

## 2017-05-21 ENCOUNTER — Ambulatory Visit (INDEPENDENT_AMBULATORY_CARE_PROVIDER_SITE_OTHER): Payer: Medicare Other | Admitting: Internal Medicine

## 2017-05-21 VITALS — BP 196/86 | HR 78 | Temp 98.7°F | Resp 16 | Ht 62.0 in | Wt 158.2 lb

## 2017-05-21 DIAGNOSIS — I10 Essential (primary) hypertension: Secondary | ICD-10-CM | POA: Diagnosis not present

## 2017-05-21 DIAGNOSIS — E118 Type 2 diabetes mellitus with unspecified complications: Secondary | ICD-10-CM

## 2017-05-21 DIAGNOSIS — E785 Hyperlipidemia, unspecified: Secondary | ICD-10-CM

## 2017-05-21 DIAGNOSIS — J4540 Moderate persistent asthma, uncomplicated: Secondary | ICD-10-CM

## 2017-05-21 DIAGNOSIS — J301 Allergic rhinitis due to pollen: Secondary | ICD-10-CM

## 2017-05-21 DIAGNOSIS — E790 Hyperuricemia without signs of inflammatory arthritis and tophaceous disease: Secondary | ICD-10-CM | POA: Diagnosis not present

## 2017-05-21 DIAGNOSIS — Z23 Encounter for immunization: Secondary | ICD-10-CM | POA: Diagnosis not present

## 2017-05-21 LAB — CBC WITH DIFFERENTIAL/PLATELET
Basophils Absolute: 0 K/uL (ref 0.0–0.1)
Basophils Relative: 0.3 % (ref 0.0–3.0)
Eosinophils Absolute: 0 K/uL (ref 0.0–0.7)
Eosinophils Relative: 0.3 % (ref 0.0–5.0)
HCT: 40.1 % (ref 36.0–46.0)
Hemoglobin: 13.1 g/dL (ref 12.0–15.0)
Lymphocytes Relative: 25.4 % (ref 12.0–46.0)
Lymphs Abs: 1.7 K/uL (ref 0.7–4.0)
MCHC: 32.8 g/dL (ref 30.0–36.0)
MCV: 90.7 fl (ref 78.0–100.0)
Monocytes Absolute: 0.4 K/uL (ref 0.1–1.0)
Monocytes Relative: 6.4 % (ref 3.0–12.0)
Neutro Abs: 4.5 K/uL (ref 1.4–7.7)
Neutrophils Relative %: 67.6 % (ref 43.0–77.0)
Platelets: 176 K/uL (ref 150.0–400.0)
RBC: 4.43 Mil/uL (ref 3.87–5.11)
RDW: 14 % (ref 11.5–15.5)
WBC: 6.6 K/uL (ref 4.0–10.5)

## 2017-05-21 LAB — COMPREHENSIVE METABOLIC PANEL WITH GFR
ALT: 12 U/L (ref 0–35)
AST: 13 U/L (ref 0–37)
Albumin: 4.4 g/dL (ref 3.5–5.2)
Alkaline Phosphatase: 73 U/L (ref 39–117)
BUN: 52 mg/dL — ABNORMAL HIGH (ref 6–23)
CO2: 21 meq/L (ref 19–32)
Calcium: 9.8 mg/dL (ref 8.4–10.5)
Chloride: 109 meq/L (ref 96–112)
Creatinine, Ser: 1.49 mg/dL — ABNORMAL HIGH (ref 0.40–1.20)
GFR: 43.84 mL/min — ABNORMAL LOW (ref >60.00)
Glucose, Bld: 106 mg/dL — ABNORMAL HIGH (ref 70–99)
Potassium: 4.9 meq/L (ref 3.5–5.1)
Sodium: 138 meq/L (ref 135–145)
Total Bilirubin: 0.3 mg/dL (ref 0.2–1.2)
Total Protein: 7.4 g/dL (ref 6.0–8.3)

## 2017-05-21 LAB — MICROALBUMIN / CREATININE URINE RATIO
Creatinine,U: 115.1 mg/dL
MICROALB/CREAT RATIO: 1.7 mg/g (ref 0.0–30.0)
Microalb, Ur: 2 mg/dL — ABNORMAL HIGH (ref 0.0–1.9)

## 2017-05-21 LAB — TSH: TSH: 2.3 u[IU]/mL (ref 0.35–4.50)

## 2017-05-21 LAB — URINALYSIS, ROUTINE W REFLEX MICROSCOPIC
Bilirubin Urine: NEGATIVE
Hgb urine dipstick: NEGATIVE
Ketones, ur: NEGATIVE
Leukocytes, UA: NEGATIVE
Nitrite: NEGATIVE
PH: 6 (ref 5.0–8.0)
RBC / HPF: NONE SEEN (ref 0–?)
SPECIFIC GRAVITY, URINE: 1.015 (ref 1.000–1.030)
TOTAL PROTEIN, URINE-UPE24: NEGATIVE
URINE GLUCOSE: NEGATIVE
Urobilinogen, UA: 0.2 (ref 0.0–1.0)
WBC, UA: NONE SEEN (ref 0–?)

## 2017-05-21 LAB — HEMOGLOBIN A1C: HEMOGLOBIN A1C: 6.5 % (ref 4.6–6.5)

## 2017-05-21 LAB — URIC ACID: Uric Acid, Serum: 8.9 mg/dL — ABNORMAL HIGH (ref 2.4–7.0)

## 2017-05-21 LAB — LIPID PANEL
Cholesterol: 176 mg/dL (ref 0–200)
HDL: 80 mg/dL (ref >39.00)
LDL Cholesterol: 87 mg/dL (ref 0–99)
NONHDL: 96.28
TRIGLYCERIDES: 45 mg/dL (ref 0.0–149.0)
Total CHOL/HDL Ratio: 2
VLDL: 9 mg/dL (ref 0.0–40.0)

## 2017-05-21 MED ORDER — CHLORTHALIDONE 25 MG PO TABS: 25.0000 mg | ORAL_TABLET | Freq: Every day | ORAL | 1 refills | Status: DC

## 2017-05-21 MED ORDER — MOMETASONE FUROATE 50 MCG/ACT NA SUSP: 4.0000 | Freq: Every day | NASAL | 12 refills | Status: DC

## 2017-05-21 MED ORDER — BUDESONIDE-FORMOTEROL FUMARATE 160-4.5 MCG/ACT IN AERO: 2.0000 | INHALATION_SPRAY | Freq: Two times a day (BID) | RESPIRATORY_TRACT | 12 refills | Status: DC

## 2017-05-21 NOTE — Patient Instructions (Signed)

## 2017-05-21 NOTE — Progress Notes (Signed)
Subjective:  Patient ID: Sarah [name], female    DOB: 10-31-1941  Age: 76 y.o. MRN: [id number]  CC: Asthma; Hypertension; Diabetes; and Hyperlipidemia   HPI FRANNIE SHEDRICK presents for f/up - She has stopped taking Singulair and using Breo because she felt like they caused a headache. She is using albuterol controller wheezing. She complains of persistent nasal congestion and postnasal drip.  Outpatient Medications Prior to Visit  Medication Sig Dispense Refill  . albuterol (PROVENTIL HFA;VENTOLIN HFA) 108 (90 Base) MCG/ACT inhaler Inhale 2 puffs into the lungs every 6 (six) hours as needed for wheezing or shortness of breath. 1 Inhaler 3  . aspirin 81 MG tablet Take 1 tablet (81 mg total) by mouth daily. 30 tablet 6  . atorvastatin (LIPITOR) 10 MG tablet Take 1 tablet (10 mg total) by mouth daily. 90 tablet 3  . azelastine (ASTELIN) 0.1 % nasal spray Place 2 sprays into both nostrils 2 (two) times daily. Use in each nostril as directed 30 mL 11  . diltiazem (TIAZAC) 300 MG 24 hr capsule Take 1 capsule (300 mg total) by mouth daily before breakfast. 90 capsule 0  . losartan (COZAAR) 100 MG tablet Take 1 tablet (100 mg total) by mouth daily after lunch. 90 tablet 1  . hydrochlorothiazide (MICROZIDE) 12.5 MG capsule Take 1 capsule (12.5 mg total) by mouth daily. 90 capsule 1  . HYDROcodone-homatropine (HYCODAN) 5-1.5 MG/5ML syrup Take 5 mLs by mouth every 8 (eight) hours as needed for cough. 120 mL 0  . levocetirizine (XYZAL) 5 MG tablet Take 1 tablet (5 mg total) by mouth every evening. (Patient not taking: Reported on 05/21/2017) 90 tablet 3  . metoprolol (LOPRESSOR) 50 MG tablet Take 1 tablet (50 mg total) by mouth daily. 90 tablet 1  . dexlansoprazole (DEXILANT) 60 MG capsule Take 1 capsule (60 mg total) by mouth daily. (Patient not taking: Reported on 05/21/2017) 90 capsule 3  . dextromethorphan-guaiFENesin (MUCINEX DM) 30-600 MG 12hr tablet Take 2 tablets by mouth 2 (two) times daily as  needed for cough. 120 tablet 11  . fluticasone furoate-vilanterol (BREO ELLIPTA) 200-25 MCG/INH AEPB Inhale 1 puff into the lungs daily. (Patient not taking: Reported on 05/21/2017) 90 each 3  . mometasone (NASONEX) 50 MCG/ACT nasal spray Place 4 sprays into the nose daily. (Patient not taking: Reported on 05/21/2017) 17 g 12  . montelukast (SINGULAIR) 10 MG tablet Take 1 tablet (10 mg total) by mouth at bedtime. 90 tablet 3  . zolpidem (AMBIEN) 10 MG tablet Take 1 tablet (10 mg total) by mouth at bedtime as needed. (Patient not taking: Reported on 05/21/2017) 90 tablet 1   No facility-administered medications prior to visit.     ROS Review of Systems  Constitutional: Negative.  Negative for activity change, appetite change, chills, diaphoresis, fatigue, fever and unexpected weight change.  HENT: Positive for congestion, postnasal drip and rhinorrhea. Negative for facial swelling, sinus pain, sinus pressure, sore throat, trouble swallowing and voice change.   Eyes: Negative.  Negative for visual disturbance.  Respiratory: Positive for wheezing. Negative for cough, chest tightness and shortness of breath.   Cardiovascular: Negative.  Negative for chest pain, palpitations and leg swelling.  Gastrointestinal: Negative for abdominal pain, constipation, diarrhea, nausea and vomiting.  Endocrine: Negative.  Negative for polydipsia, polyphagia and polyuria.  Genitourinary: Negative.  Negative for decreased urine volume, difficulty urinating, dysuria and urgency.  Musculoskeletal: Negative.  Negative for back pain and myalgias.  Skin: Negative.  Negative for rash.  Allergic/Immunologic: Negative.   Neurological: Negative.  Negative for dizziness and weakness.  Hematological: Negative for adenopathy. Does not bruise/bleed easily.  Psychiatric/Behavioral: Negative.     Objective:  BP (!) 196/86 (BP Location: Left Arm, Patient Position: Sitting, Cuff Size: Normal)   Pulse 78   Temp 98.7 F (37.1 C)  (Oral)   Resp 16   Ht 5\' 2"  (1.575 m)   Wt 158 lb 4 oz (71.8 kg)   SpO2 99%   BMI 28.94 kg/m   BP Readings from Last 3 Encounters:  05/21/17 (!) 196/86  04/30/17 140/74  10/20/16 (!) 154/70    Wt Readings from Last 3 Encounters:  05/21/17 158 lb 4 oz (71.8 kg)  04/30/17 160 lb (72.6 kg)  10/20/16 151 lb 12 oz (68.8 kg)    Physical Exam  Constitutional: She is oriented to person, place, and time. No distress.  HENT:  Mouth/Throat: Oropharynx is clear and moist. No oropharyngeal exudate.  Eyes: Conjunctivae are normal. Right eye exhibits no discharge. Left eye exhibits no discharge. No scleral icterus.  Neck: Neck supple. No JVD present. No thyromegaly present.  Cardiovascular: Normal rate, regular rhythm and intact distal pulses.  Exam reveals no gallop and no friction rub.   No murmur heard. Pulmonary/Chest: Effort normal and breath sounds normal. No respiratory distress. She has no wheezes. She has no rales. She exhibits no tenderness.  Abdominal: Soft. Bowel sounds are normal. She exhibits no distension and no mass. There is no tenderness. There is no rebound and no guarding.  Musculoskeletal: Normal range of motion. She exhibits no edema, tenderness or deformity.  Lymphadenopathy:    She has no cervical adenopathy.  Neurological: She is alert and oriented to person, place, and time.  Skin: Skin is warm and dry. No rash noted. She is not diaphoretic. No erythema. No pallor.  Vitals reviewed.   Lab Results  Component Value Date   WBC 6.6 05/21/2017   HGB 13.1 05/21/2017   HCT 40.1 05/21/2017   PLT 176.0 Repeated and verified X2. 05/21/2017   GLUCOSE 106 (H) 05/21/2017   CHOL 176 05/21/2017   TRIG 45.0 05/21/2017   HDL 80.00 05/21/2017   LDLCALC 87 05/21/2017   ALT 12 05/21/2017   AST 13 05/21/2017   NA 138 05/21/2017   K 4.9 05/21/2017   CL 109 05/21/2017   CREATININE 1.49 (H) 05/21/2017   BUN 52 (H) 05/21/2017   CO2 21 05/21/2017   TSH 2.30 05/21/2017    HGBA1C 6.5 05/21/2017   MICROALBUR 2.0 (H) 05/21/2017    Dg Chest 2 View  Result Date: 05/01/2017 CLINICAL DATA:  Subacute onset of cough, congestion and dyspnea. Initial encounter. EXAM: CHEST  2 VIEW COMPARISON:  Chest radiograph from 01/24/2016 FINDINGS: The lungs are well-aerated. Mild peribronchial thickening is noted. There is no evidence of focal opacification, pleural effusion or pneumothorax. The heart is normal in size; the mediastinal contour is within normal limits. No acute osseous abnormalities are seen. IMPRESSION: Mild peribronchial thickening noted.  Lungs otherwise clear. Electronically Signed   By: Garald Balding M.D.   On: 05/01/2017 04:57    Assessment & Plan:   Annalia was seen today for asthma, hypertension, diabetes and hyperlipidemia.  Diagnoses and all orders for this visit:  Essential hypertension- her blood pressure is not well controlled, electrolytes are normal, renal function is stable, Ladd a thiazide diuretic for better blood pressure control. -     Comprehensive metabolic panel; Future -     CBC  with Differential/Platelet; Future -     Urinalysis, Routine w reflex microscopic; Future -     chlorthalidone (HYGROTON) 25 MG tablet; Take 1 tablet (25 mg total) by mouth daily.  Type 2 diabetes mellitus with complication, without long-term current use of insulin ([place])- her A1c is at 6.5%, her blood sugars are adequately well controlled. 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 -     Uric acid; Future  Hyperlipidemia with target LDL less than 100- she has achieved her LDL goal is doing well on the statin. -     Lipid panel; Future -     TSH; Future  Chronic seasonal allergic rhinitis due to pollen -     mometasone (NASONEX) 50 MCG/ACT nasal spray; Place 4  sprays into the nose daily.  Asthma in adult, moderate persistent, uncomplicated- I've asked her to change to a different LABA/ICS inhaler combination. -     budesonide-formoterol (SYMBICORT) 160-4.5 MCG/ACT inhaler; Inhale 2 puffs into the lungs 2 (two) times daily.  Need for Tdap vaccination -     Tdap vaccine greater than or equal to 7yo IM   I have discontinued Ms. Elmquist's hydrochlorothiazide, dexlansoprazole, dextromethorphan-guaiFENesin, zolpidem, montelukast, fluticasone furoate-vilanterol, and HYDROcodone-homatropine. I am also having her start on chlorthalidone and budesonide-formoterol. Additionally, I am having her maintain her aspirin, atorvastatin, levocetirizine, azelastine, metoprolol tartrate, losartan, diltiazem, albuterol, bimatoprost, and mometasone.  Meds ordered this encounter  Medications  . bimatoprost (LUMIGAN) 0.01 % SOLN    Sig: 1 drop at bedtime.  . mometasone (NASONEX) 50 MCG/ACT nasal spray    Sig: Place 4 sprays into the nose daily.    Dispense:  17 g    Refill:  12  . chlorthalidone (HYGROTON) 25 MG tablet    Sig: Take 1 tablet (25 mg total) by mouth daily.    Dispense:  90 tablet    Refill:  1  . budesonide-formoterol (SYMBICORT) 160-4.5 MCG/ACT inhaler    Sig: Inhale 2 puffs into the lungs 2 (two) times daily.    Dispense:  1 Inhaler    Refill:  12     Follow-up: Return in about 6 weeks (around 07/02/2017).  Scarlette Calico, MD

## 2017-05-28 DIAGNOSIS — H179 Unspecified corneal scar and opacity: Secondary | ICD-10-CM | POA: Diagnosis not present

## 2017-05-28 DIAGNOSIS — H1013 Acute atopic conjunctivitis, bilateral: Secondary | ICD-10-CM | POA: Diagnosis not present

## 2017-05-28 DIAGNOSIS — H401131 Primary open-angle glaucoma, bilateral, mild stage: Secondary | ICD-10-CM | POA: Diagnosis not present

## 2017-05-28 DIAGNOSIS — H40053 Ocular hypertension, bilateral: Secondary | ICD-10-CM | POA: Diagnosis not present

## 2017-06-01 ENCOUNTER — Other Ambulatory Visit: Payer: Self-pay

## 2017-06-01 ENCOUNTER — Other Ambulatory Visit: Payer: Self-pay | Admitting: Internal Medicine

## 2017-06-01 DIAGNOSIS — F5105 Insomnia due to other mental disorder: Principal | ICD-10-CM

## 2017-06-01 DIAGNOSIS — F409 Phobic anxiety disorder, unspecified: Secondary | ICD-10-CM | POA: Insufficient documentation

## 2017-06-01 MED ORDER — ZOLPIDEM TARTRATE 5 MG PO TABS: 5.0000 mg | ORAL_TABLET | Freq: Every evening | ORAL | 5 refills | Status: DC | PRN

## 2017-06-01 NOTE — Telephone Encounter (Signed)
RX written 

## 2017-06-01 NOTE — Telephone Encounter (Signed)
Patient is requesting a refill on Zolpidem (Ambien) please advise

## 2017-06-02 NOTE — Telephone Encounter (Signed)
This has been faxed.

## 2017-06-02 NOTE — Telephone Encounter (Signed)
Pt would like medication sent to [facility].

## 2017-06-02 NOTE — Telephone Encounter (Signed)
LVM for pt to call back as soon as possible.   RE: what pharmacy

## 2017-06-15 DIAGNOSIS — H401131 Primary open-angle glaucoma, bilateral, mild stage: Secondary | ICD-10-CM | POA: Diagnosis not present

## 2017-06-15 DIAGNOSIS — L270 Generalized skin eruption due to drugs and medicaments taken internally: Secondary | ICD-10-CM | POA: Diagnosis not present

## 2017-06-15 DIAGNOSIS — H16223 Keratoconjunctivitis sicca, not specified as Sjogren's, bilateral: Secondary | ICD-10-CM | POA: Diagnosis not present

## 2017-06-15 DIAGNOSIS — H40052 Ocular hypertension, left eye: Secondary | ICD-10-CM | POA: Diagnosis not present

## 2017-06-30 ENCOUNTER — Other Ambulatory Visit: Payer: Self-pay | Admitting: Internal Medicine

## 2017-06-30 ENCOUNTER — Ambulatory Visit (INDEPENDENT_AMBULATORY_CARE_PROVIDER_SITE_OTHER): Payer: Medicare Other | Admitting: *Deleted

## 2017-06-30 ENCOUNTER — Telehealth: Payer: Self-pay | Admitting: *Deleted

## 2017-06-30 ENCOUNTER — Telehealth: Payer: Self-pay | Admitting: Internal Medicine

## 2017-06-30 VITALS — BP 146/72 | HR 76 | Resp 20 | Ht 62.0 in | Wt 155.0 lb

## 2017-06-30 DIAGNOSIS — I10 Essential (primary) hypertension: Secondary | ICD-10-CM

## 2017-06-30 DIAGNOSIS — Z0000 Encounter for general adult medical examination without abnormal findings: Secondary | ICD-10-CM

## 2017-06-30 MED ORDER — METOPROLOL TARTRATE 50 MG PO TABS: 50.0000 mg | ORAL_TABLET | Freq: Every day | ORAL | 1 refills | Status: DC

## 2017-06-30 MED ORDER — DILTIAZEM HCL ER BEADS 300 MG PO CP24: 300.0000 mg | ORAL_CAPSULE | Freq: Every day | ORAL | 1 refills | Status: DC

## 2017-06-30 NOTE — Telephone Encounter (Signed)
metoprolol (LOPRESSOR) 50 MG tablet  [facility] [address] [place], [place] Phone:# [phone] Fax: # [phone]  Pharmacy called requesting a refill on this medication. Please advise.

## 2017-06-30 NOTE — Progress Notes (Signed)
Pre visit review using our clinic review tool, if applicable. No additional management support is needed unless otherwise documented below in the visit note. 

## 2017-06-30 NOTE — Telephone Encounter (Signed)
During AWV, patient stated that she needed a refill for diltiazem and asked to have the prescription sent to [facility] mail order pharmacy which is on her pharmacy list.

## 2017-06-30 NOTE — Telephone Encounter (Signed)
Reviewed chart pt is up-to-date sent refill TO [facility]...Johny Chess

## 2017-06-30 NOTE — Patient Instructions (Signed)
Continue doing brain stimulating activities (puzzles, reading, adult coloring books, staying active) to keep memory sharp.   Continue to eat heart healthy diet (full of fruits, vegetables, whole grains, lean protein, water--limit salt, fat, and sugar intake) and increase physical activity as tolerated.   Sarah [name] , Thank you for taking time to come for your Medicare Wellness Visit. I appreciate your ongoing commitment to your health goals. Please review the following plan we discussed and let me know if I can assist you in the future.   These are the goals we discussed: Goals    . Stay as healthy as possible to keep blood pressure and blood sugar in parameter          I will continue to follow heart healthy and diabetic diet.        This is a list of the screening recommended for you and due dates:  Health Maintenance  Topic Date Due  . DEXA scan (bone density measurement)  02/20/2007  . Flu Shot  07/01/2017  . Hemoglobin A1C  11/20/2017  . Eye exam for diabetics  01/14/2018  . Complete foot exam   05/21/2018  . Colon Cancer Screening  10/20/2019  . Tetanus Vaccine  05/22/2027  . Pneumonia vaccines  Completed

## 2017-06-30 NOTE — Progress Notes (Signed)
Subjective:   Sarah [name] is a 76 y.o. female who presents for Medicare Annual (Subsequent) preventive examination.  Review of Systems:  No ROS.  Medicare Wellness Visit. Additional risk factors are reflected in the social history.  Cardiac Risk Factors include: advanced age (>58men, >62 women);diabetes mellitus;dyslipidemia;hypertension Sleep patterns: gets up 1 times nightly to void and sleeps 6-7 hours nightly.    Home Safety/Smoke Alarms: Feels safe in home. Smoke alarms in place.  Living environment; residence and Firearm Safety: 1-story house/ trailer, no firearms. Lives alone, no needs for DME, good support system Seat Belt Safety/Bike Helmet: Wears seat belt.   Counseling:   Eye Exam- appointment yearly, Dr. Herbert Deaner [facility]- appointment every 6 months Dr. Eddie Dibbles  Female:   Pap- N/A       Mammo- Last 12/01/16      Dexa scan- N/D, patient states she will ask her GYN to schedule appointment     CCS- Last 10/19/14, polyps, recall 5 years     Objective:     Vitals: BP (!) 146/72   Pulse 76   Resp 20   Ht 5\' 2"  (1.575 m)   Wt 155 lb (70.3 kg)   SpO2 100%   BMI 28.35 kg/m   Body mass index is 28.35 kg/m.   Tobacco History  Smoking Status  . Never Smoker  Smokeless Tobacco  . Never Used     Counseling given: Not Answered   Past Medical History:  Diagnosis Date  . Allergic rhinitis   . Arthritis   . Cataract   . Chronic back pain   . Contact dermatitis and other eczema, due to unspecified cause   . Diabetes mellitus without complication ([place])   . Dysrhythmia    hx of heart beating fast   . History of sinusitis   . Hx of adenomatous colonic polyps 10/17/2014  . Hypertension   . Peripheral vascular disease ([facility])    hx of DVT - left leg   . Personal history of unspecified circulatory disease   . Plantar fascial fibromatosis   . Shortness of breath    with exertion    Past Surgical History:  Procedure Laterality Date  . ABDOMINAL HYSTERECTOMY    .  CATARACT EXTRACTION Bilateral   . FLEXIBLE SIGMOIDOSCOPY    . FOOT SURGERY Bilateral   . History of Lumbar fusion     '87  . SHOULDER ARTHROSCOPY WITH ROTATOR CUFF REPAIR AND SUBACROMIAL DECOMPRESSION Left 01/14/2013   Procedure: Left Shoulder Arthroscopy with Debridement, Subacromial Decompression,rotator cuff repair;  Surgeon: Mcarthur Rossetti, MD;  Location: [facility];  Service: Orthopedics;  Laterality: Left;  Left Shoulder Arthroscopy with Debridement, Subacromial Decompression   Family History  Problem Relation Age of Onset  . Leukemia Mother   . Brain cancer Father        mets  . Throat cancer Brother        x 2 brothers   History  Sexual Activity  . Sexual activity: Not Currently    Outpatient Encounter Prescriptions as of 06/30/2017  Medication Sig  . albuterol (PROVENTIL HFA;VENTOLIN HFA) 108 (90 Base) MCG/ACT inhaler Inhale 2 puffs into the lungs every 6 (six) hours as needed for wheezing or shortness of breath.  Marland Kitchen aspirin 81 MG tablet Take 1 tablet (81 mg total) by mouth daily.  Marland Kitchen atorvastatin (LIPITOR) 10 MG tablet Take 1 tablet (10 mg total) by mouth daily.  Marland Kitchen azelastine (ASTELIN) 0.1 % nasal spray Place 2 sprays into both  nostrils 2 (two) times daily. Use in each nostril as directed  . bimatoprost (LUMIGAN) 0.01 % SOLN 1 drop at bedtime.  . budesonide-formoterol (SYMBICORT) 160-4.5 MCG/ACT inhaler Inhale 2 puffs into the lungs 2 (two) times daily.  . chlorthalidone (HYGROTON) 25 MG tablet Take 1 tablet (25 mg total) by mouth daily.  Marland Kitchen diltiazem (TIAZAC) 300 MG 24 hr capsule Take 1 capsule (300 mg total) by mouth daily before breakfast.  . levocetirizine (XYZAL) 5 MG tablet Take 1 tablet (5 mg total) by mouth every evening.  Marland Kitchen losartan (COZAAR) 100 MG tablet Take 1 tablet (100 mg total) by mouth daily after lunch.  . metoprolol tartrate (LOPRESSOR) 50 MG tablet Take 1 tablet (50 mg total) by mouth daily.  . mometasone (NASONEX) 50 MCG/ACT nasal spray Place 4 sprays  into the nose daily.  Marland Kitchen zolpidem (AMBIEN) 5 MG tablet Take 1 tablet (5 mg total) by mouth at bedtime as needed for sleep.   No facility-administered encounter medications on file as of 06/30/2017.     Activities of Daily Living In your present state of health, do you have any difficulty performing the following activities: 06/30/2017  Hearing? N  Vision? N  Difficulty concentrating or making decisions? N  Walking or climbing stairs? N  Dressing or bathing? N  Doing errands, shopping? N  Preparing Food and eating ? N  Using the Toilet? N  In the past six months, have you accidently leaked urine? N  Do you have problems with loss of bowel control? N  Managing your Medications? N  Managing your Finances? N  Housekeeping or managing your Housekeeping? N  Some recent data might be hidden    Patient Care Team: [name], MD as PCP - General (Internal Medicine)    Assessment:    Physical assessment deferred to PCP.  Exercise Activities and Dietary recommendations Current Exercise Habits: The patient does not participate in regular exercise at present (chair exercise pamphlets provided), Exercise limited by: orthopedic condition(s)  Diet (meal preparation, eat out, water intake, caffeinated beverages, dairy products, fruits and vegetables): in general, a "healthy" diet  , well balanced, eats a variety of fruits and vegetables daily, limits salt, fat/cholesterol, sugar, caffeine, drinks 6-8 glasses of water daily.   Goals    . Stay as healthy as possible to keep blood pressure and blood sugar in parameter          I will continue to follow heart healthy and diabetic diet.       Fall Risk Fall Risk  06/30/2017 08/15/2014  Falls in the past year? No No   Depression Screen PHQ 2/9 Scores 06/30/2017 08/15/2014  PHQ - 2 Score 1 0  PHQ- 9 Score 3 -     Cognitive Function MMSE - Mini Mental State Exam 06/30/2017  Orientation to time 5  Orientation to Place 5  Registration 3    Attention/ Calculation 4  Recall 2  Language- name 2 objects 2  Language- repeat 1  Language- follow 3 step command 3  Language- read & follow direction 1  Write a sentence 1  Copy design 1  Total score 28        Immunization History  Administered Date(s) Administered  . Influenza Split 09/08/2011  . Influenza, Seasonal, Injecte, Preservative Fre 12/08/2012  . Influenza,inj,Quad PF,36+ Mos 08/09/2013, 08/14/2014, 01/24/2016, 07/22/2016  . Pneumococcal Conjugate-13 12/18/2014  . Pneumococcal Polysaccharide-23 08/09/2013  . Tdap 05/21/2017  . Tetanus 02/09/2014  . Zoster 02/09/2014  Screening Tests Health Maintenance  Topic Date Due  . DEXA SCAN  02/20/2007  . INFLUENZA VACCINE  07/01/2017  . HEMOGLOBIN A1C  11/20/2017  . OPHTHALMOLOGY EXAM  01/14/2018  . FOOT EXAM  05/21/2018  . COLONOSCOPY  10/20/2019  . TETANUS/TDAP  05/22/2027  . PNA vac Low Risk Adult  Completed      Plan:    Continue doing brain stimulating activities (puzzles, reading, adult coloring books, staying active) to keep memory sharp.   Continue to eat heart healthy diet (full of fruits, vegetables, whole grains, lean protein, water--limit salt, fat, and sugar intake) and increase physical activity as tolerated.  I have personally reviewed and noted the following in the patient's chart:   . Medical and social history . Use of alcohol, tobacco or illicit drugs  . Current medications and supplements . Functional ability and status . Nutritional status . Physical activity . Advanced directives . List of other physicians . Vitals . Screenings to include cognitive, depression, and falls . Referrals and appointments  In addition, I have reviewed and discussed with patient certain preventive protocols, quality metrics, and best practice recommendations. A written personalized care plan for preventive services as well as general preventive health recommendations were provided to patient.     Michiel Cowboy, RN  06/30/2017

## 2017-07-07 DIAGNOSIS — H2513 Age-related nuclear cataract, bilateral: Secondary | ICD-10-CM | POA: Diagnosis not present

## 2017-07-07 DIAGNOSIS — H25013 Cortical age-related cataract, bilateral: Secondary | ICD-10-CM | POA: Diagnosis not present

## 2017-07-07 DIAGNOSIS — H401131 Primary open-angle glaucoma, bilateral, mild stage: Secondary | ICD-10-CM | POA: Diagnosis not present

## 2017-07-07 DIAGNOSIS — H40052 Ocular hypertension, left eye: Secondary | ICD-10-CM | POA: Diagnosis not present

## 2017-08-06 DIAGNOSIS — J324 Chronic pansinusitis: Secondary | ICD-10-CM | POA: Diagnosis not present

## 2017-08-06 DIAGNOSIS — R05 Cough: Secondary | ICD-10-CM | POA: Diagnosis not present

## 2017-08-11 ENCOUNTER — Telehealth: Payer: Self-pay

## 2017-08-11 DIAGNOSIS — E1111 Type 2 diabetes mellitus with ketoacidosis with coma: Secondary | ICD-10-CM

## 2017-08-11 DIAGNOSIS — I10 Essential (primary) hypertension: Secondary | ICD-10-CM

## 2017-08-11 DIAGNOSIS — Z794 Long term (current) use of insulin: Secondary | ICD-10-CM

## 2017-08-11 MED ORDER — LOSARTAN POTASSIUM 100 MG PO TABS: 100.0000 mg | ORAL_TABLET | Freq: Every day | ORAL | 1 refills | Status: DC

## 2017-08-11 NOTE — Telephone Encounter (Signed)
erx sent for losartan

## 2017-08-19 DIAGNOSIS — H25812 Combined forms of age-related cataract, left eye: Secondary | ICD-10-CM | POA: Diagnosis not present

## 2017-08-19 DIAGNOSIS — H2512 Age-related nuclear cataract, left eye: Secondary | ICD-10-CM | POA: Diagnosis not present

## 2017-08-19 DIAGNOSIS — H401121 Primary open-angle glaucoma, left eye, mild stage: Secondary | ICD-10-CM | POA: Diagnosis not present

## 2017-08-24 DIAGNOSIS — H25011 Cortical age-related cataract, right eye: Secondary | ICD-10-CM | POA: Diagnosis not present

## 2017-08-24 DIAGNOSIS — H2511 Age-related nuclear cataract, right eye: Secondary | ICD-10-CM | POA: Diagnosis not present

## 2017-09-07 ENCOUNTER — Telehealth: Payer: Self-pay | Admitting: Internal Medicine

## 2017-09-07 NOTE — Telephone Encounter (Signed)
erro

## 2017-09-09 DIAGNOSIS — H401111 Primary open-angle glaucoma, right eye, mild stage: Secondary | ICD-10-CM | POA: Diagnosis not present

## 2017-09-09 DIAGNOSIS — H2512 Age-related nuclear cataract, left eye: Secondary | ICD-10-CM | POA: Diagnosis not present

## 2017-09-09 DIAGNOSIS — H2511 Age-related nuclear cataract, right eye: Secondary | ICD-10-CM | POA: Diagnosis not present

## 2017-09-09 DIAGNOSIS — H40011 Open angle with borderline findings, low risk, right eye: Secondary | ICD-10-CM | POA: Diagnosis not present

## 2017-09-09 DIAGNOSIS — H268 Other specified cataract: Secondary | ICD-10-CM | POA: Diagnosis not present

## 2017-10-15 ENCOUNTER — Encounter: Payer: Self-pay | Admitting: Internal Medicine

## 2017-10-15 ENCOUNTER — Other Ambulatory Visit (INDEPENDENT_AMBULATORY_CARE_PROVIDER_SITE_OTHER): Payer: Medicare Other

## 2017-10-15 ENCOUNTER — Ambulatory Visit: Payer: Medicare Other | Admitting: Internal Medicine

## 2017-10-15 VITALS — BP 138/82 | HR 80 | Temp 98.1°F | Resp 16 | Ht 62.0 in | Wt 152.0 lb

## 2017-10-15 DIAGNOSIS — I10 Essential (primary) hypertension: Secondary | ICD-10-CM

## 2017-10-15 DIAGNOSIS — E118 Type 2 diabetes mellitus with unspecified complications: Secondary | ICD-10-CM

## 2017-10-15 DIAGNOSIS — N3281 Overactive bladder: Secondary | ICD-10-CM | POA: Diagnosis not present

## 2017-10-15 DIAGNOSIS — Z23 Encounter for immunization: Secondary | ICD-10-CM | POA: Diagnosis not present

## 2017-10-15 LAB — URINALYSIS, ROUTINE W REFLEX MICROSCOPIC
Bilirubin Urine: NEGATIVE
HGB URINE DIPSTICK: NEGATIVE
Ketones, ur: NEGATIVE
Leukocytes, UA: NEGATIVE
NITRITE: NEGATIVE
RBC / HPF: NONE SEEN (ref 0–?)
SPECIFIC GRAVITY, URINE: 1.01 (ref 1.000–1.030)
Total Protein, Urine: NEGATIVE
Urine Glucose: NEGATIVE
Urobilinogen, UA: 0.2 (ref 0.0–1.0)
pH: 6 (ref 5.0–8.0)

## 2017-10-15 LAB — BASIC METABOLIC PANEL
BUN: 45 mg/dL — ABNORMAL HIGH (ref 6–23)
CHLORIDE: 104 meq/L (ref 96–112)
CO2: 21 meq/L (ref 19–32)
Calcium: 10.1 mg/dL (ref 8.4–10.5)
Creatinine, Ser: 1.81 mg/dL — ABNORMAL HIGH (ref 0.40–1.20)
GFR: 34.99 mL/min — ABNORMAL LOW (ref >60.00)
Glucose, Bld: 91 mg/dL (ref 70–99)
POTASSIUM: 4.3 meq/L (ref 3.5–5.1)
SODIUM: 137 meq/L (ref 135–145)

## 2017-10-15 LAB — HEMOGLOBIN A1C: HEMOGLOBIN A1C: 6.1 % (ref 4.6–6.5)

## 2017-10-15 MED ORDER — SOLIFENACIN SUCCINATE 10 MG PO TABS: 10.0000 mg | ORAL_TABLET | Freq: Every day | ORAL | 1 refills | Status: DC

## 2017-10-15 NOTE — Patient Instructions (Signed)

## 2017-10-15 NOTE — Progress Notes (Signed)
Subjective:  Patient ID: Sarah [name], female    DOB: [birth year]  Age: 76 y.o. MRN: [id number]  CC: Hypertension and Diabetes   HPI BRENNA FRIESENHAHN presents for f/up -she complains of frequent urination throughout the day and night.  She complains that she gets up about 3-4 times a night to urinate.  She admits that she drinks a lot of water and has a glass of water at about 10:00 when she goes to bed.  She denies abdominal pain, dysuria, or hematuria.  She does not monitor her blood sugars.  Outpatient Medications Prior to Visit  Medication Sig Dispense Refill  . albuterol (PROVENTIL HFA;VENTOLIN HFA) 108 (90 Base) MCG/ACT inhaler Inhale 2 puffs into the lungs every 6 (six) hours as needed for wheezing or shortness of breath. 1 Inhaler 3  . aspirin 81 MG tablet Take 1 tablet (81 mg total) by mouth daily. 30 tablet 6  . atorvastatin (LIPITOR) 10 MG tablet Take 1 tablet (10 mg total) by mouth daily. 90 tablet 3  . azelastine (ASTELIN) 0.1 % nasal spray Place 2 sprays into both nostrils 2 (two) times daily. Use in each nostril as directed 30 mL 11  . bimatoprost (LUMIGAN) 0.01 % SOLN 1 drop at bedtime.    . budesonide-formoterol (SYMBICORT) 160-4.5 MCG/ACT inhaler Inhale 2 puffs into the lungs 2 (two) times daily. 1 Inhaler 12  . chlorthalidone (HYGROTON) 25 MG tablet Take 1 tablet (25 mg total) by mouth daily. 90 tablet 1  . diltiazem (TIAZAC) 300 MG 24 hr capsule Take 1 capsule (300 mg total) by mouth daily before breakfast. 90 capsule 1  . levocetirizine (XYZAL) 5 MG tablet Take 1 tablet (5 mg total) by mouth every evening. 90 tablet 3  . losartan (COZAAR) 100 MG tablet Take 1 tablet (100 mg total) by mouth daily after lunch. 90 tablet 1  . metoprolol tartrate (LOPRESSOR) 50 MG tablet Take 1 tablet (50 mg total) by mouth daily. 90 tablet 1  . mometasone (NASONEX) 50 MCG/ACT nasal spray Place 4 sprays into the nose daily. 17 g 12  . zolpidem (AMBIEN) 5 MG tablet Take 1 tablet (5 mg total) by  mouth at bedtime as needed for sleep. 30 tablet 5   No facility-administered medications prior to visit.     ROS Review of Systems  Constitutional: Negative.  Negative for chills, diaphoresis, fatigue and fever.  HENT: Negative.   Respiratory: Negative.  Negative for cough, chest tightness, shortness of breath and wheezing.   Cardiovascular: Negative for chest pain, palpitations and leg swelling.  Gastrointestinal: Negative for abdominal pain, constipation, diarrhea, nausea and vomiting.  Endocrine: Positive for polyuria.  Genitourinary: Positive for frequency. Negative for decreased urine volume, difficulty urinating, dysuria, enuresis, flank pain, hematuria, urgency and vaginal discharge.  Musculoskeletal: Negative.  Negative for back pain, myalgias and neck pain.  Skin: Negative.  Negative for color change and rash.  Neurological: Negative.  Negative for dizziness, weakness, light-headedness and numbness.  Hematological: Negative for adenopathy. Does not bruise/bleed easily.  Psychiatric/Behavioral: Negative.     Objective:  BP 138/82 (BP Location: Left Arm, Patient Position: Sitting, Cuff Size: Normal)   Pulse 80   Temp 98.1 F (36.7 C) (Oral)   Resp 16   Ht 5\' 2"  (1.575 m)   Wt 152 lb (68.9 kg)   SpO2 98%   BMI 27.80 kg/m   BP Readings from Last 3 Encounters:  10/15/17 138/82  06/30/17 (!) 146/72  05/21/17 (!) 196/86  Wt Readings from Last 3 Encounters:  10/15/17 152 lb (68.9 kg)  06/30/17 155 lb (70.3 kg)  05/21/17 158 lb 4 oz (71.8 kg)    Physical Exam  Constitutional: She is oriented to person, place, and time. No distress.  HENT:  Mouth/Throat: Oropharynx is clear and moist. No oropharyngeal exudate.  Eyes: Conjunctivae are normal. Right eye exhibits no discharge. Left eye exhibits no discharge. No scleral icterus.  Neck: Normal range of motion. Neck supple. No JVD present. No thyromegaly present.  Cardiovascular: Normal rate, regular rhythm and intact  distal pulses. Exam reveals no gallop.  No murmur heard. Pulmonary/Chest: Effort normal and breath sounds normal. No respiratory distress. She has no wheezes. She has no rales. She exhibits no tenderness.  Abdominal: Soft. Bowel sounds are normal. She exhibits no distension and no mass. There is no tenderness. There is no rebound and no guarding.  Musculoskeletal: Normal range of motion. She exhibits no edema, tenderness or deformity.  Lymphadenopathy:    She has no cervical adenopathy.  Neurological: She is alert and oriented to person, place, and time.  Skin: Skin is warm and dry. No rash noted. She is not diaphoretic. No erythema. No pallor.  Vitals reviewed.   Lab Results  Component Value Date   WBC 6.6 05/21/2017   HGB 13.1 05/21/2017   HCT 40.1 05/21/2017   PLT 176.0 Repeated and verified X2. 05/21/2017   GLUCOSE 91 10/15/2017   CHOL 176 05/21/2017   TRIG 45.0 05/21/2017   HDL 80.00 05/21/2017   LDLCALC 87 05/21/2017   ALT 12 05/21/2017   AST 13 05/21/2017   NA 137 10/15/2017   K 4.3 10/15/2017   CL 104 10/15/2017   CREATININE 1.81 (H) 10/15/2017   BUN 45 (H) 10/15/2017   CO2 21 10/15/2017   TSH 2.30 05/21/2017   HGBA1C 6.1 10/15/2017   MICROALBUR 2.0 (H) 05/21/2017    Dg Chest 2 View  Result Date: 05/01/2017 CLINICAL DATA:  Subacute onset of cough, congestion and dyspnea. Initial encounter. EXAM: CHEST  2 VIEW COMPARISON:  Chest radiograph from 01/24/2016 FINDINGS: The lungs are well-aerated. Mild peribronchial thickening is noted. There is no evidence of focal opacification, pleural effusion or pneumothorax. The heart is normal in size; the mediastinal contour is within normal limits. No acute osseous abnormalities are seen. IMPRESSION: Mild peribronchial thickening noted.  Lungs otherwise clear. Electronically Signed   By: Garald Balding M.D.   On: 05/01/2017 04:57    Assessment & Plan:   Krystyna was seen today for hypertension and diabetes.  Diagnoses and all  orders for this visit:  Need for influenza vaccination -     Flu vaccine HIGH DOSE PF (Fluzone High dose)  OAB (overactive bladder)- I have asked her to decrease her water intake by one half during the day and to eliminate any liquids after 7 PM.  Will start Vesicare for symptom relief.  Her lab work is negative for any secondary causes or infection that would explain her symptoms. -     solifenacin (VESICARE) 10 MG tablet; Take 1 tablet (10 mg total) daily by mouth. -     Urinalysis, Routine w reflex microscopic; Future  Essential hypertension- Her blood pressure is adequately well controlled.  Her electrolytes are normal.  Renal function is stable. -     Basic metabolic panel; Future -     Urinalysis, Routine w reflex microscopic; Future  Type 2 diabetes mellitus with complication, without long-term current use of insulin ([place])- Her  A1c is down to 6.1%.  Her blood sugars are adequately well controlled.  Medical therapy is not indicated. -     Basic metabolic panel; Future -     Hemoglobin A1c; Future -     Urinalysis, Routine w reflex microscopic; Future   I am having Sarah [name] start on solifenacin. I am also having her maintain her aspirin, atorvastatin, levocetirizine, azelastine, albuterol, bimatoprost, mometasone, chlorthalidone, budesonide-formoterol, zolpidem, metoprolol tartrate, diltiazem, and losartan.  Meds ordered this encounter  Medications  . solifenacin (VESICARE) 10 MG tablet    Sig: Take 1 tablet (10 mg total) daily by mouth.    Dispense:  90 tablet    Refill:  1     Follow-up: Return in about 3 months (around 01/15/2018).  Scarlette Calico, MD

## 2017-11-02 DIAGNOSIS — B373 Candidiasis of vulva and vagina: Secondary | ICD-10-CM | POA: Diagnosis not present

## 2017-11-02 DIAGNOSIS — Z01419 Encounter for gynecological examination (general) (routine) without abnormal findings: Secondary | ICD-10-CM | POA: Diagnosis not present

## 2017-11-02 DIAGNOSIS — Z6826 Body mass index (BMI) 26.0-26.9, adult: Secondary | ICD-10-CM | POA: Diagnosis not present

## 2017-11-06 ENCOUNTER — Other Ambulatory Visit: Payer: Self-pay | Admitting: Internal Medicine

## 2017-11-06 DIAGNOSIS — I10 Essential (primary) hypertension: Secondary | ICD-10-CM

## 2017-11-22 ENCOUNTER — Other Ambulatory Visit: Payer: Self-pay | Admitting: Internal Medicine

## 2017-11-22 DIAGNOSIS — F409 Phobic anxiety disorder, unspecified: Secondary | ICD-10-CM

## 2017-11-22 DIAGNOSIS — F5105 Insomnia due to other mental disorder: Principal | ICD-10-CM

## 2017-11-23 NOTE — Telephone Encounter (Signed)
Check [place] registry last filled 10/29/2017.../lmb  

## 2017-12-14 DIAGNOSIS — H401131 Primary open-angle glaucoma, bilateral, mild stage: Secondary | ICD-10-CM | POA: Diagnosis not present

## 2017-12-14 LAB — HM DIABETES EYE EXAM

## 2017-12-15 ENCOUNTER — Telehealth: Payer: Self-pay | Admitting: Internal Medicine

## 2017-12-15 DIAGNOSIS — I10 Essential (primary) hypertension: Secondary | ICD-10-CM

## 2017-12-15 MED ORDER — METOPROLOL TARTRATE 50 MG PO TABS: 50.0000 mg | ORAL_TABLET | Freq: Every day | ORAL | 1 refills | Status: DC

## 2017-12-15 NOTE — Telephone Encounter (Signed)
Copied from [place] [phone]. Topic: Quick Communication - See Telephone Encounter >> Dec 15, 2017 12:11 PM Vernona Rieger wrote: CRM for notification. See Telephone encounter for:   12/15/17.  [facility] needs a new script for metoprolol tartrate (LOPRESSOR) 50 MG tablet Fax number [phone]

## 2017-12-15 NOTE — Telephone Encounter (Signed)
Routing to provider for new script  Metoprolol: OV: 10/15/17 LR: 06/30/17 #90 1 RF

## 2017-12-15 NOTE — Telephone Encounter (Signed)
Reviewed chart pt is up-to-date sent refills to mail service.../lmb  

## 2017-12-27 ENCOUNTER — Other Ambulatory Visit: Payer: Self-pay | Admitting: Internal Medicine

## 2017-12-27 DIAGNOSIS — I10 Essential (primary) hypertension: Secondary | ICD-10-CM

## 2017-12-27 MED ORDER — DILTIAZEM HCL ER BEADS 300 MG PO CP24: 300.0000 mg | ORAL_CAPSULE | Freq: Every day | ORAL | 1 refills | Status: DC

## 2017-12-29 ENCOUNTER — Encounter: Payer: Self-pay | Admitting: Internal Medicine

## 2017-12-29 NOTE — Progress Notes (Signed)
Outside notes received. Information abstracted. Notes sent to scan.  

## 2018-02-06 ENCOUNTER — Other Ambulatory Visit: Payer: Self-pay | Admitting: Internal Medicine

## 2018-02-06 DIAGNOSIS — I10 Essential (primary) hypertension: Secondary | ICD-10-CM

## 2018-02-18 ENCOUNTER — Other Ambulatory Visit: Payer: Self-pay | Admitting: Internal Medicine

## 2018-02-18 DIAGNOSIS — F409 Phobic anxiety disorder, unspecified: Secondary | ICD-10-CM

## 2018-02-18 DIAGNOSIS — F5105 Insomnia due to other mental disorder: Principal | ICD-10-CM

## 2018-03-02 ENCOUNTER — Ambulatory Visit: Payer: Medicare Other | Admitting: Internal Medicine

## 2018-03-02 ENCOUNTER — Other Ambulatory Visit: Payer: Self-pay | Admitting: Internal Medicine

## 2018-03-02 ENCOUNTER — Encounter: Payer: Self-pay | Admitting: Internal Medicine

## 2018-03-02 VITALS — BP 142/82 | HR 83 | Temp 99.5°F | Resp 16 | Ht 62.0 in | Wt 152.1 lb

## 2018-03-02 DIAGNOSIS — E118 Type 2 diabetes mellitus with unspecified complications: Secondary | ICD-10-CM

## 2018-03-02 DIAGNOSIS — Z794 Long term (current) use of insulin: Secondary | ICD-10-CM

## 2018-03-02 DIAGNOSIS — F5105 Insomnia due to other mental disorder: Secondary | ICD-10-CM | POA: Diagnosis not present

## 2018-03-02 DIAGNOSIS — I10 Essential (primary) hypertension: Secondary | ICD-10-CM | POA: Diagnosis not present

## 2018-03-02 DIAGNOSIS — E1111 Type 2 diabetes mellitus with ketoacidosis with coma: Secondary | ICD-10-CM

## 2018-03-02 DIAGNOSIS — J301 Allergic rhinitis due to pollen: Secondary | ICD-10-CM | POA: Diagnosis not present

## 2018-03-02 DIAGNOSIS — F409 Phobic anxiety disorder, unspecified: Secondary | ICD-10-CM | POA: Diagnosis not present

## 2018-03-02 DIAGNOSIS — J0100 Acute maxillary sinusitis, unspecified: Secondary | ICD-10-CM | POA: Diagnosis not present

## 2018-03-02 LAB — POCT GLYCOSYLATED HEMOGLOBIN (HGB A1C): HEMOGLOBIN A1C: 5.9

## 2018-03-02 MED ORDER — LEVOCETIRIZINE DIHYDROCHLORIDE 5 MG PO TABS: 5.0000 mg | ORAL_TABLET | Freq: Every evening | ORAL | 1 refills | Status: DC

## 2018-03-02 MED ORDER — CEFDINIR 300 MG PO CAPS: 300.0000 mg | ORAL_CAPSULE | Freq: Two times a day (BID) | ORAL | 0 refills | Status: AC

## 2018-03-02 MED ORDER — HYDROCODONE-HOMATROPINE 5-1.5 MG/5ML PO SYRP: 5.0000 mL | ORAL_SOLUTION | Freq: Three times a day (TID) | ORAL | 0 refills | Status: AC | PRN

## 2018-03-02 MED ORDER — MOMETASONE FUROATE 50 MCG/ACT NA SUSP: 4.0000 | Freq: Every day | NASAL | 1 refills | Status: DC

## 2018-03-02 MED ORDER — HYDROCODONE-HOMATROPINE 5-1.5 MG/5ML PO SYRP: 5.0000 mL | ORAL_SOLUTION | Freq: Three times a day (TID) | ORAL | 0 refills | Status: DC | PRN

## 2018-03-02 MED ORDER — METHYLPREDNISOLONE 4 MG PO TBPK: ORAL_TABLET | ORAL | 0 refills | Status: AC

## 2018-03-02 MED ORDER — ZOLPIDEM TARTRATE 10 MG PO TABS: 10.0000 mg | ORAL_TABLET | Freq: Every evening | ORAL | 1 refills | Status: DC | PRN

## 2018-03-02 NOTE — Patient Instructions (Signed)

## 2018-03-02 NOTE — Progress Notes (Signed)
Subjective:  Patient ID: Sarah [name], female    DOB: [birth year]  Age: 77 y.o. MRN: [id number]  CC: Hypertension; Diabetes; Allergic Rhinitis ; and Sinusitis   HPI Sarah [name] presents for f/up - She complains of a one-week history of left facial pain, sneezing, runny nose, postnasal drip, and nasal congestion.  She also has a cough that is productive of white phlegm but she denies night sweats, shortness of breath, wheezing, chest pain or hemoptysis.  She has had a few chills but she denies fever.  She complains of insomnia.  She tells me that 5 mg of Ambien is not enough to keep her asleep through the night.  She wants to increase the dose to 10 mg a day.  Outpatient Medications Prior to Visit  Medication Sig Dispense Refill  . albuterol (PROVENTIL HFA;VENTOLIN HFA) 108 (90 Base) MCG/ACT inhaler Inhale 2 puffs into the lungs every 6 (six) hours as needed for wheezing or shortness of breath. 1 Inhaler 3  . aspirin 81 MG tablet Take 1 tablet (81 mg total) by mouth daily. 30 tablet 6  . azelastine (ASTELIN) 0.1 % nasal spray Place 2 sprays into both nostrils 2 (two) times daily. Use in each nostril as directed 30 mL 11  . bimatoprost (LUMIGAN) 0.01 % SOLN 1 drop at bedtime.    . budesonide-formoterol (SYMBICORT) 160-4.5 MCG/ACT inhaler Inhale 2 puffs into the lungs 2 (two) times daily. 1 Inhaler 12  . chlorthalidone (HYGROTON) 25 MG tablet TAKE 1 TABLET(25 MG) BY MOUTH DAILY 90 tablet 0  . diltiazem (TIAZAC) 300 MG 24 hr capsule Take 1 capsule (300 mg total) by mouth daily before breakfast. 90 capsule 1  . losartan (COZAAR) 100 MG tablet TAKE 1 TABLET BY MOUTH AFTER LUNCH 90 tablet 1  . metoprolol tartrate (LOPRESSOR) 50 MG tablet Take 1 tablet (50 mg total) by mouth daily. 90 tablet 1  . solifenacin (VESICARE) 10 MG tablet Take 1 tablet (10 mg total) daily by mouth. 90 tablet 1  . levocetirizine (XYZAL) 5 MG tablet Take 1 tablet (5 mg total) by mouth every evening. 90 tablet 3  .  mometasone (NASONEX) 50 MCG/ACT nasal spray Place 4 sprays into the nose daily. 17 g 12  . zolpidem (AMBIEN) 5 MG tablet TAKE 1 TABLET BY MOUTH AT BEDTIME AS NEEDED FOR SLEEP 30 tablet 2  . atorvastatin (LIPITOR) 10 MG tablet Take 1 tablet (10 mg total) by mouth daily. (Patient not taking: Reported on 03/02/2018) 90 tablet 3   No facility-administered medications prior to visit.     ROS Review of Systems  Constitutional: Positive for chills. Negative for fatigue and fever.  HENT: Positive for congestion, postnasal drip, rhinorrhea, sinus pressure, sinus pain and sneezing. Negative for facial swelling, sore throat, trouble swallowing and voice change.   Eyes: Negative.  Negative for visual disturbance.  Respiratory: Positive for cough. Negative for chest tightness, shortness of breath and wheezing.   Cardiovascular: Negative for chest pain, palpitations and leg swelling.  Gastrointestinal: Negative for abdominal pain, constipation, diarrhea, nausea and vomiting.  Endocrine: Negative.   Genitourinary: Negative.  Negative for difficulty urinating.  Musculoskeletal: Negative.  Negative for back pain, myalgias and neck pain.  Skin: Negative.  Negative for color change and pallor.  Neurological: Negative.  Negative for dizziness, weakness, light-headedness and headaches.  Hematological: Negative for adenopathy. Does not bruise/bleed easily.  Psychiatric/Behavioral: Positive for sleep disturbance. Negative for behavioral problems, confusion, decreased concentration and dysphoric mood. The patient is  not nervous/anxious.     Objective:  BP (!) 142/82   Pulse 83   Temp 99.5 F (37.5 C) (Oral)   Resp 16   Ht 5\' 2"  (1.575 m)   Wt 152 lb 1.3 oz (69 kg)   SpO2 99%   BMI 27.82 kg/m   BP Readings from Last 3 Encounters:  03/02/18 (!) 142/82  10/15/17 138/82  06/30/17 (!) 146/72    Wt Readings from Last 3 Encounters:  03/02/18 152 lb 1.3 oz (69 kg)  10/15/17 152 lb (68.9 kg)  06/30/17 155  lb (70.3 kg)    Physical Exam  Constitutional: She is oriented to person, place, and time.  Non-toxic appearance. She does not have a sickly appearance. She does not appear ill. No distress.  HENT:  Nose: Mucosal edema and rhinorrhea present. No nasal septal hematoma. Right sinus exhibits no maxillary sinus tenderness and no frontal sinus tenderness. Left sinus exhibits maxillary sinus tenderness. Left sinus exhibits no frontal sinus tenderness.  Mouth/Throat: Oropharynx is clear and moist. No oropharyngeal exudate.  Eyes: Conjunctivae are normal. Left eye exhibits no discharge. No scleral icterus.  Neck: Normal range of motion. Neck supple. No JVD present. No thyromegaly present.  Cardiovascular: Normal rate, regular rhythm and normal heart sounds. Exam reveals no gallop.  No murmur heard. Pulmonary/Chest: Effort normal and breath sounds normal. No respiratory distress. She has no wheezes. She has no rales.  Abdominal: Soft. Bowel sounds are normal. She exhibits no distension and no mass. There is no tenderness. There is no guarding.  Musculoskeletal: Normal range of motion. She exhibits no edema, tenderness or deformity.  Neurological: She is alert and oriented to person, place, and time.  Skin: Skin is warm and dry. No rash noted. She is not diaphoretic. No erythema. No pallor.  Psychiatric: She has a normal mood and affect. Her behavior is normal. Judgment and thought content normal.  Vitals reviewed.   Lab Results  Component Value Date   WBC 6.6 05/21/2017   HGB 13.1 05/21/2017   HCT 40.1 05/21/2017   PLT 176.0 Repeated and verified X2. 05/21/2017   GLUCOSE 91 10/15/2017   CHOL 176 05/21/2017   TRIG 45.0 05/21/2017   HDL 80.00 05/21/2017   LDLCALC 87 05/21/2017   ALT 12 05/21/2017   AST 13 05/21/2017   NA 137 10/15/2017   K 4.3 10/15/2017   CL 104 10/15/2017   CREATININE 1.81 (H) 10/15/2017   BUN 45 (H) 10/15/2017   CO2 21 10/15/2017   TSH 2.30 05/21/2017   HGBA1C 5.9  03/02/2018   MICROALBUR 2.0 (H) 05/21/2017    Dg Chest 2 View  Result Date: 05/01/2017 CLINICAL DATA:  Subacute onset of cough, congestion and dyspnea. Initial encounter. EXAM: CHEST  2 VIEW COMPARISON:  Chest radiograph from 01/24/2016 FINDINGS: The lungs are well-aerated. Mild peribronchial thickening is noted. There is no evidence of focal opacification, pleural effusion or pneumothorax. The heart is normal in size; the mediastinal contour is within normal limits. No acute osseous abnormalities are seen. IMPRESSION: Mild peribronchial thickening noted.  Lungs otherwise clear. Electronically Signed   By: Garald Balding M.D.   On: 05/01/2017 04:57    Assessment & Plan:   Anastasha was seen today for hypertension, diabetes, allergic rhinitis  and sinusitis.  Diagnoses and all orders for this visit:  Type 2 diabetes mellitus with complication, without long-term current use of insulin ([place])- Her A1c is at 5.9%.  Her blood sugars are adequately well controlled. -  POCT glycosylated hemoglobin (Hb A1C)  Essential hypertension- her BP is adequately well controlled.  Seasonal allergic rhinitis due to pollen- She is having a flareup of symptoms so will treat with a course of systemic steroids.  I have also asked her to start an antihistamine and steroid nasal spray. -     levocetirizine (XYZAL) 5 MG tablet; Take 1 tablet (5 mg total) by mouth every evening. -     mometasone (NASONEX) 50 MCG/ACT nasal spray; Place 4 sprays into the nose daily. -     methylPREDNISolone (MEDROL DOSEPAK) 4 MG TBPK tablet; TAKE AS DIRECTED  Insomnia due to anxiety and fear -     zolpidem (AMBIEN) 10 MG tablet; Take 1 tablet (10 mg total) by mouth at bedtime as needed for sleep.  Acute non-recurrent maxillary sinusitis- I will treat the infection with Omnicef and will control the symptoms with Hycodan as needed. -     Discontinue: HYDROcodone-homatropine (HYCODAN) 5-1.5 MG/5ML syrup; Take 5 mLs by mouth every 8 (eight)  hours as needed for up to 7 days for cough. -     HYDROcodone-homatropine (HYCODAN) 5-1.5 MG/5ML syrup; Take 5 mLs by mouth every 8 (eight) hours as needed for up to 7 days for cough.   I have discontinued [facility] zolpidem. I am also having her start on zolpidem, methylPREDNISolone, and cefdinir. Additionally, I am having her maintain her aspirin, atorvastatin, azelastine, albuterol, bimatoprost, budesonide-formoterol, solifenacin, metoprolol tartrate, diltiazem, chlorthalidone, losartan, levocetirizine, mometasone, and HYDROcodone-homatropine.  Meds ordered this encounter  Medications  . levocetirizine (XYZAL) 5 MG tablet    Sig: Take 1 tablet (5 mg total) by mouth every evening.    Dispense:  90 tablet    Refill:  1  . zolpidem (AMBIEN) 10 MG tablet    Sig: Take 1 tablet (10 mg total) by mouth at bedtime as needed for sleep.    Dispense:  90 tablet    Refill:  1  . mometasone (NASONEX) 50 MCG/ACT nasal spray    Sig: Place 4 sprays into the nose daily.    Dispense:  51 g    Refill:  1  . DISCONTD: HYDROcodone-homatropine (HYCODAN) 5-1.5 MG/5ML syrup    Sig: Take 5 mLs by mouth every 8 (eight) hours as needed for up to 7 days for cough.    Dispense:  120 mL    Refill:  0  . methylPREDNISolone (MEDROL DOSEPAK) 4 MG TBPK tablet    Sig: TAKE AS DIRECTED    Dispense:  21 tablet    Refill:  0  . HYDROcodone-homatropine (HYCODAN) 5-1.5 MG/5ML syrup    Sig: Take 5 mLs by mouth every 8 (eight) hours as needed for up to 7 days for cough.    Dispense:  120 mL    Refill:  0  . cefdinir (OMNICEF) 300 MG capsule    Sig: Take 1 capsule (300 mg total) by mouth 2 (two) times daily for 10 days.    Dispense:  20 capsule    Refill:  0     Follow-up: Return in about 3 weeks (around 03/23/2018).  Scarlette Calico, MD

## 2018-03-23 ENCOUNTER — Encounter: Payer: Self-pay | Admitting: Internal Medicine

## 2018-03-23 ENCOUNTER — Ambulatory Visit: Payer: Medicare Other | Admitting: Internal Medicine

## 2018-03-23 VITALS — BP 138/82 | HR 85 | Temp 98.3°F | Resp 16 | Ht 62.0 in | Wt 152.2 lb

## 2018-03-23 DIAGNOSIS — J301 Allergic rhinitis due to pollen: Secondary | ICD-10-CM

## 2018-03-23 DIAGNOSIS — I10 Essential (primary) hypertension: Secondary | ICD-10-CM | POA: Diagnosis not present

## 2018-03-23 NOTE — Progress Notes (Signed)
Subjective:  Patient ID: Sarah [name], female    DOB: May 11, 1941  Age: 77 y.o. MRN: [id number]  CC: Hypertension and Allergic Rhinitis    HPI MEGA KINKADE presents for f/up - She tells me her blood pressure has been well controlled.  She continues to have nasal allergy symptoms with nasal congestion, postnasal drip, and runny nose.  When I last saw her I prescribed Xyzal.  She got it in the mail but did not know what it was so she has not been taking it.  Outpatient Medications Prior to Visit  Medication Sig Dispense Refill  . albuterol (PROVENTIL HFA;VENTOLIN HFA) 108 (90 Base) MCG/ACT inhaler Inhale 2 puffs into the lungs every 6 (six) hours as needed for wheezing or shortness of breath. 1 Inhaler 3  . aspirin 81 MG tablet Take 1 tablet (81 mg total) by mouth daily. 30 tablet 6  . azelastine (ASTELIN) 0.1 % nasal spray Place 2 sprays into both nostrils 2 (two) times daily. Use in each nostril as directed 30 mL 11  . chlorthalidone (HYGROTON) 25 MG tablet TAKE 1 TABLET(25 MG) BY MOUTH DAILY 90 tablet 0  . diltiazem (TIAZAC) 300 MG 24 hr capsule Take 1 capsule (300 mg total) by mouth daily before breakfast. 90 capsule 1  . levocetirizine (XYZAL) 5 MG tablet Take 1 tablet (5 mg total) by mouth every evening. 90 tablet 1  . losartan (COZAAR) 100 MG tablet TAKE 1 TABLET BY MOUTH AFTER LUNCH 90 tablet 1  . metoprolol tartrate (LOPRESSOR) 50 MG tablet Take 1 tablet (50 mg total) by mouth daily. 90 tablet 1  . solifenacin (VESICARE) 10 MG tablet Take 1 tablet (10 mg total) daily by mouth. 90 tablet 1  . zolpidem (AMBIEN) 10 MG tablet Take 1 tablet (10 mg total) by mouth at bedtime as needed for sleep. 90 tablet 1  . atorvastatin (LIPITOR) 10 MG tablet Take 1 tablet (10 mg total) by mouth daily. (Patient not taking: Reported on 03/23/2018) 90 tablet 3  . bimatoprost (LUMIGAN) 0.01 % SOLN 1 drop at bedtime.    . budesonide-formoterol (SYMBICORT) 160-4.5 MCG/ACT inhaler Inhale 2 puffs into the lungs  2 (two) times daily. (Patient not taking: Reported on 03/23/2018) 1 Inhaler 12  . mometasone (NASONEX) 50 MCG/ACT nasal spray Place 4 sprays into the nose daily. (Patient not taking: Reported on 03/23/2018) 51 g 1   No facility-administered medications prior to visit.     ROS Review of Systems  Constitutional: Negative for diaphoresis, fatigue and fever.  HENT: Positive for congestion, postnasal drip and rhinorrhea. Negative for facial swelling, sinus pressure, sinus pain, sneezing, sore throat and tinnitus.   Eyes: Negative.   Respiratory: Negative for cough, chest tightness, shortness of breath and wheezing.   Cardiovascular: Negative for chest pain, palpitations and leg swelling.  Gastrointestinal: Negative.  Negative for abdominal pain, diarrhea, nausea and vomiting.  Endocrine: Negative.   Genitourinary: Negative.  Negative for difficulty urinating.  Musculoskeletal: Negative.  Negative for arthralgias.  Skin: Negative.  Negative for color change, pallor and rash.  Neurological: Negative.  Negative for dizziness, weakness, light-headedness and headaches.  Hematological: Negative for adenopathy. Does not bruise/bleed easily.  Psychiatric/Behavioral: Negative.     Objective:  BP 138/82 (BP Location: Left Arm, Patient Position: Sitting, Cuff Size: Normal)   Pulse 85   Temp 98.3 F (36.8 C) (Oral)   Resp 16   Ht 5\' 2"  (1.575 m)   Wt 152 lb 4 oz (69.1 kg)  SpO2 98%   BMI 27.85 kg/m   BP Readings from Last 3 Encounters:  03/23/18 138/82  03/02/18 (!) 142/82  10/15/17 138/82    Wt Readings from Last 3 Encounters:  03/23/18 152 lb 4 oz (69.1 kg)  03/02/18 152 lb 1.3 oz (69 kg)  10/15/17 152 lb (68.9 kg)    Physical Exam  Constitutional: She is oriented to person, place, and time. No distress.  HENT:  Nose: Mucosal edema and rhinorrhea present. No sinus tenderness. No epistaxis. Right sinus exhibits no maxillary sinus tenderness and no frontal sinus tenderness. Left sinus  exhibits no maxillary sinus tenderness and no frontal sinus tenderness.  Mouth/Throat: Oropharynx is clear and moist. No oropharyngeal exudate.  Eyes: Conjunctivae are normal. No scleral icterus.  Neck: Normal range of motion. Neck supple. No JVD present. No thyromegaly present.  Cardiovascular: Normal rate, regular rhythm and normal heart sounds. Exam reveals no gallop and no friction rub.  No murmur heard. Pulmonary/Chest: Effort normal and breath sounds normal. No stridor. No respiratory distress. She has no wheezes. She has no rales.  Abdominal: Soft. Bowel sounds are normal. She exhibits no mass. There is no tenderness.  Musculoskeletal: Normal range of motion. She exhibits no edema, tenderness or deformity.  Lymphadenopathy:    She has no cervical adenopathy.  Neurological: She is alert and oriented to person, place, and time.  Skin: Skin is warm and dry. She is not diaphoretic. No pallor.  Vitals reviewed.   Lab Results  Component Value Date   WBC 6.6 05/21/2017   HGB 13.1 05/21/2017   HCT 40.1 05/21/2017   PLT 176.0 Repeated and verified X2. 05/21/2017   GLUCOSE 91 10/15/2017   CHOL 176 05/21/2017   TRIG 45.0 05/21/2017   HDL 80.00 05/21/2017   LDLCALC 87 05/21/2017   ALT 12 05/21/2017   AST 13 05/21/2017   NA 137 10/15/2017   K 4.3 10/15/2017   CL 104 10/15/2017   CREATININE 1.81 (H) 10/15/2017   BUN 45 (H) 10/15/2017   CO2 21 10/15/2017   TSH 2.30 05/21/2017   HGBA1C 5.9 03/02/2018   MICROALBUR 2.0 (H) 05/21/2017    Dg Chest 2 View  Result Date: 05/01/2017 CLINICAL DATA:  Subacute onset of cough, congestion and dyspnea. Initial encounter. EXAM: CHEST  2 VIEW COMPARISON:  Chest radiograph from 01/24/2016 FINDINGS: The lungs are well-aerated. Mild peribronchial thickening is noted. There is no evidence of focal opacification, pleural effusion or pneumothorax. The heart is normal in size; the mediastinal contour is within normal limits. No acute osseous abnormalities  are seen. IMPRESSION: Mild peribronchial thickening noted.  Lungs otherwise clear. Electronically Signed   By: Garald Balding M.D.   On: 05/01/2017 04:57    Assessment & Plan:   Ashling was seen today for hypertension and allergic rhinitis .  Diagnoses and all orders for this visit:  Essential hypertension- Her blood pressure is adequately well controlled.  I will monitor her electrolytes and renal function. -     Basic metabolic panel; Future  Seasonal allergic rhinitis due to pollen- Her symptoms are not adequately well controlled.  I encouraged her to go ahead and start taking the Xyzal and to use the Nasonex as directed.   I am having Sarah [name] maintain her aspirin, atorvastatin, azelastine, albuterol, bimatoprost, budesonide-formoterol, solifenacin, metoprolol tartrate, diltiazem, chlorthalidone, losartan, levocetirizine, zolpidem, and mometasone.  No orders of the defined types were placed in this encounter.    Follow-up: Return in about 6 months (around  09/22/2018).  Scarlette Calico, MD

## 2018-03-23 NOTE — Patient Instructions (Signed)

## 2018-03-30 DIAGNOSIS — H401131 Primary open-angle glaucoma, bilateral, mild stage: Secondary | ICD-10-CM | POA: Diagnosis not present

## 2018-05-08 ENCOUNTER — Other Ambulatory Visit: Payer: Self-pay | Admitting: Internal Medicine

## 2018-05-08 DIAGNOSIS — I10 Essential (primary) hypertension: Secondary | ICD-10-CM

## 2018-06-05 ENCOUNTER — Other Ambulatory Visit: Payer: Self-pay | Admitting: Internal Medicine

## 2018-06-05 DIAGNOSIS — J4540 Moderate persistent asthma, uncomplicated: Secondary | ICD-10-CM

## 2018-06-22 ENCOUNTER — Ambulatory Visit: Payer: Self-pay

## 2018-06-22 NOTE — Telephone Encounter (Signed)
Patient called in with c/o "heart racing." She says "sometimes my heart beats real fast and other times it's fine. It's been going on for a while, probably a month or longer. My eye doctor told me it was fast and that was a month ago. When it happens, I can be up moving around or just sitting relaxing and I just calm myself down and get real quiet, then it goes down. It's not happening now." I asked about other symptoms, she says "I do get a little dizzy when my heart is beating fast, but it goes away when I get quiet and relax." I asked is she taking her medication, she says "yes, as prescribed." According to protocol, see PCP within 2 weeks. Patient says she has to be seen on Tuesday because that's when her daughter can drive her. No availability with PCP, appointment scheduled for Tuesday, 06/29/18 at 1440 with Dr. Sharlet Salina, care advice given, patient verbalized understanding.  Reason for Disposition . Palpitations are a chronic symptom (recurrent or ongoing AND present > 4 weeks)  Answer Assessment - Initial Assessment Questions 1. DESCRIPTION: "Please describe your heart rate or heart beat that you are having" (e.g., fast/slow, regular/irregular, skipped or extra beats, "palpitations")     Not racing now, but it's noticeable when it happens 2. ONSET: "When did it start?" (Minutes, hours or days)      It happens anytime, going on for about a month off and on 3. DURATION: "How long does it last" (e.g., seconds, minutes, hours)     It lasts longer than minutes, maybe an hour 4. PATTERN "Does it come and go, or has it been constant since it started?"  "Does it get worse with exertion?"   "Are you feeling it now?"     Come and go 5. TAP: "Using your hand, can you tap out what you are feeling on a chair or table in front of you, so that I can hear?" (Note: not all patients can do this)       Not able to do this 6. HEART RATE: "Can you tell me your heart rate?" "How many beats in 15 seconds?"  (Note:  not all patients can do this)       No 7. RECURRENT SYMPTOM: "Have you ever had this before?" If so, ask: "When was the last time?" and "What happened that time?"      Yes 8. CAUSE: "What do you think is causing the palpitations?"     No 9. CARDIAC HISTORY: "Do you have any history of heart disease?" (e.g., heart attack, angina, bypass surgery, angioplasty, arrhythmia)      No 10. OTHER SYMPTOMS: "Do you have any other symptoms?" (e.g., dizziness, chest pain, sweating, difficulty breathing)       Dizzy when heart is beating fast 11. PREGNANCY: "Is there any chance you are pregnant?" "When was your last menstrual period?"       No  Protocols used: HEART RATE AND HEARTBEAT QUESTIONS-A-AH

## 2018-06-24 ENCOUNTER — Other Ambulatory Visit: Payer: Self-pay | Admitting: Internal Medicine

## 2018-06-24 DIAGNOSIS — I10 Essential (primary) hypertension: Secondary | ICD-10-CM

## 2018-06-29 ENCOUNTER — Encounter: Payer: Self-pay | Admitting: Internal Medicine

## 2018-06-29 ENCOUNTER — Ambulatory Visit: Payer: Medicare Other | Admitting: Internal Medicine

## 2018-06-29 VITALS — BP 162/70 | HR 85 | Temp 98.5°F | Ht 62.0 in | Wt 152.0 lb

## 2018-06-29 DIAGNOSIS — R002 Palpitations: Secondary | ICD-10-CM

## 2018-06-29 NOTE — Patient Instructions (Signed)
We have checked the EKG today and it is not changed from before.   I have put in labs today that you can get if the symptoms get worse otherwise you can get them done with Dr. Ronnald Ramp when you come back.

## 2018-06-29 NOTE — Assessment & Plan Note (Signed)
She has normal sinus on EKG today. Prior monitor with sinus only. On calcium channel blocker. No caffeine intake. Put in labs for CBC, CMP, thyroid. She declines to get these done today due to being a hard stick. Given decreasing kidney function on last labs I did ask her to get them done and she declines.

## 2018-06-29 NOTE — Progress Notes (Signed)
   Subjective:    Patient ID: Sarah [name], female    DOB: November 18, 1941, 77 y.o.   MRN: [id number]  HPI The patient is a 77 YO female coming in for palpitations. She noticed them about 1 month or so ago. Sometimes when she wakes up. She also has them when she is nervous. She is having them right now and had them during EKG. She denies fevers or chills. Denies change in caffeine intake and avoids caffeine. Denies change in medications. Had cataract surgery in mid-July and took some different eye drops afterwards. She is not sure of the name of those and does not know what she is taking for eye drops now. She denies chest pains or pressure. Is able to exercise without palpitations.   Review of Systems  Constitutional: Negative.   HENT: Negative.   Eyes: Negative.   Respiratory: Negative for cough, chest tightness and shortness of breath.   Cardiovascular: Positive for palpitations. Negative for chest pain and leg swelling.  Gastrointestinal: Negative for abdominal distention, abdominal pain, constipation, diarrhea, nausea and vomiting.  Musculoskeletal: Negative.   Skin: Negative.   Neurological: Negative.   Psychiatric/Behavioral: Negative.       Objective:   Physical Exam  Constitutional: She is oriented to person, place, and time. She appears well-developed and well-nourished.  HENT:  Head: Normocephalic and atraumatic.  Eyes: EOM are normal.  Neck: Normal range of motion.  Cardiovascular: Normal rate and regular rhythm.  Pulmonary/Chest: Effort normal and breath sounds normal. No respiratory distress. She has no wheezes. She has no rales.  Abdominal: Soft. Bowel sounds are normal. She exhibits no distension. There is no tenderness. There is no rebound.  Musculoskeletal: She exhibits no edema.  Neurological: She is alert and oriented to person, place, and time. Coordination normal.  Skin: Skin is warm and dry.  Psychiatric:  Anxious during the visit   Vitals:   06/29/18 1430  BP:  (!) 162/70  Pulse: 85  Temp: 98.5 F (36.9 C)  TempSrc: Oral  SpO2: 99%  Weight: 152 lb (68.9 kg)  Height: 5\' 2"  (1.575 m)   EKG: Rate 89, axis normal, interval normal, sinus, no st or t wave changes, when compared with 2017 no significant change.     Assessment & Plan:

## 2018-07-13 DIAGNOSIS — Z961 Presence of intraocular lens: Secondary | ICD-10-CM | POA: Diagnosis not present

## 2018-07-13 DIAGNOSIS — H401131 Primary open-angle glaucoma, bilateral, mild stage: Secondary | ICD-10-CM | POA: Diagnosis not present

## 2018-07-13 DIAGNOSIS — H2013 Chronic iridocyclitis, bilateral: Secondary | ICD-10-CM | POA: Diagnosis not present

## 2018-07-13 DIAGNOSIS — H40053 Ocular hypertension, bilateral: Secondary | ICD-10-CM | POA: Diagnosis not present

## 2018-07-27 ENCOUNTER — Ambulatory Visit: Payer: Medicare Other | Admitting: Internal Medicine

## 2018-08-06 ENCOUNTER — Other Ambulatory Visit: Payer: Self-pay | Admitting: Internal Medicine

## 2018-08-06 DIAGNOSIS — I10 Essential (primary) hypertension: Secondary | ICD-10-CM

## 2018-08-25 ENCOUNTER — Other Ambulatory Visit: Payer: Self-pay | Admitting: Internal Medicine

## 2018-08-25 DIAGNOSIS — E1111 Type 2 diabetes mellitus with ketoacidosis with coma: Secondary | ICD-10-CM

## 2018-08-25 DIAGNOSIS — I10 Essential (primary) hypertension: Secondary | ICD-10-CM

## 2018-08-25 DIAGNOSIS — Z794 Long term (current) use of insulin: Secondary | ICD-10-CM

## 2018-08-28 ENCOUNTER — Other Ambulatory Visit: Payer: Self-pay | Admitting: Internal Medicine

## 2018-08-28 DIAGNOSIS — F5105 Insomnia due to other mental disorder: Principal | ICD-10-CM

## 2018-08-28 DIAGNOSIS — F409 Phobic anxiety disorder, unspecified: Secondary | ICD-10-CM

## 2018-09-10 ENCOUNTER — Other Ambulatory Visit: Payer: Self-pay | Admitting: Internal Medicine

## 2018-09-10 DIAGNOSIS — I10 Essential (primary) hypertension: Secondary | ICD-10-CM

## 2018-09-21 ENCOUNTER — Encounter: Payer: Self-pay | Admitting: Internal Medicine

## 2018-09-21 ENCOUNTER — Ambulatory Visit (INDEPENDENT_AMBULATORY_CARE_PROVIDER_SITE_OTHER): Payer: Medicare Other | Admitting: Internal Medicine

## 2018-09-21 ENCOUNTER — Other Ambulatory Visit (INDEPENDENT_AMBULATORY_CARE_PROVIDER_SITE_OTHER): Payer: Medicare Other

## 2018-09-21 VITALS — BP 160/70 | HR 78 | Temp 98.3°F | Resp 16 | Ht 62.0 in | Wt 153.0 lb

## 2018-09-21 DIAGNOSIS — E790 Hyperuricemia without signs of inflammatory arthritis and tophaceous disease: Secondary | ICD-10-CM

## 2018-09-21 DIAGNOSIS — I10 Essential (primary) hypertension: Secondary | ICD-10-CM | POA: Diagnosis not present

## 2018-09-21 DIAGNOSIS — E785 Hyperlipidemia, unspecified: Secondary | ICD-10-CM

## 2018-09-21 DIAGNOSIS — E559 Vitamin D deficiency, unspecified: Secondary | ICD-10-CM

## 2018-09-21 DIAGNOSIS — N183 Chronic kidney disease, stage 3 unspecified: Secondary | ICD-10-CM | POA: Insufficient documentation

## 2018-09-21 DIAGNOSIS — G3281 Cerebellar ataxia in diseases classified elsewhere: Secondary | ICD-10-CM

## 2018-09-21 DIAGNOSIS — Z0000 Encounter for general adult medical examination without abnormal findings: Secondary | ICD-10-CM

## 2018-09-21 DIAGNOSIS — K2100 Gastro-esophageal reflux disease with esophagitis, without bleeding: Secondary | ICD-10-CM

## 2018-09-21 DIAGNOSIS — Z23 Encounter for immunization: Secondary | ICD-10-CM

## 2018-09-21 DIAGNOSIS — Z794 Long term (current) use of insulin: Secondary | ICD-10-CM

## 2018-09-21 DIAGNOSIS — E118 Type 2 diabetes mellitus with unspecified complications: Secondary | ICD-10-CM

## 2018-09-21 DIAGNOSIS — E1111 Type 2 diabetes mellitus with ketoacidosis with coma: Secondary | ICD-10-CM

## 2018-09-21 DIAGNOSIS — R270 Ataxia, unspecified: Secondary | ICD-10-CM | POA: Insufficient documentation

## 2018-09-21 LAB — CBC WITH DIFFERENTIAL/PLATELET
BASOS ABS: 0 10*3/uL (ref 0.0–0.1)
Basophils Relative: 0.2 % (ref 0.0–3.0)
Eosinophils Absolute: 0.2 10*3/uL (ref 0.0–0.7)
Eosinophils Relative: 2.3 % (ref 0.0–5.0)
HEMATOCRIT: 36 % (ref 36.0–46.0)
Hemoglobin: 12.1 g/dL (ref 12.0–15.0)
LYMPHS PCT: 24.7 % (ref 12.0–46.0)
Lymphs Abs: 1.7 10*3/uL (ref 0.7–4.0)
MCHC: 33.5 g/dL (ref 30.0–36.0)
MCV: 90.8 fl (ref 78.0–100.0)
MONOS PCT: 8.6 % (ref 3.0–12.0)
Monocytes Absolute: 0.6 10*3/uL (ref 0.1–1.0)
Neutro Abs: 4.4 10*3/uL (ref 1.4–7.7)
Neutrophils Relative %: 64.2 % (ref 43.0–77.0)
Platelets: 254 10*3/uL (ref 150.0–400.0)
RBC: 3.96 Mil/uL (ref 3.87–5.11)
RDW: 12.9 % (ref 11.5–15.5)
WBC: 6.8 10*3/uL (ref 4.0–10.5)

## 2018-09-21 LAB — COMPREHENSIVE METABOLIC PANEL
ALK PHOS: 84 U/L (ref 39–117)
ALT: 11 U/L (ref 0–35)
AST: 14 U/L (ref 0–37)
Albumin: 4.6 g/dL (ref 3.5–5.2)
BILIRUBIN TOTAL: 0.3 mg/dL (ref 0.2–1.2)
BUN: 51 mg/dL — AB (ref 6–23)
CO2: 22 mEq/L (ref 19–32)
Calcium: 9.7 mg/dL (ref 8.4–10.5)
Chloride: 106 mEq/L (ref 96–112)
Creatinine, Ser: 1.84 mg/dL — ABNORMAL HIGH (ref 0.40–1.20)
GFR: 34.25 mL/min — AB (ref >60.00)
GLUCOSE: 90 mg/dL (ref 70–99)
Potassium: 4.4 mEq/L (ref 3.5–5.1)
Sodium: 138 mEq/L (ref 135–145)
TOTAL PROTEIN: 7.9 g/dL (ref 6.0–8.3)

## 2018-09-21 LAB — URINALYSIS, ROUTINE W REFLEX MICROSCOPIC
Bilirubin Urine: NEGATIVE
Hgb urine dipstick: NEGATIVE
Ketones, ur: NEGATIVE
Leukocytes, UA: NEGATIVE
Nitrite: NEGATIVE
PH: 5.5 (ref 5.0–8.0)
RBC / HPF: NONE SEEN (ref 0–?)
SPECIFIC GRAVITY, URINE: 1.01 (ref 1.000–1.030)
Total Protein, Urine: NEGATIVE
Urine Glucose: NEGATIVE
Urobilinogen, UA: 0.2 (ref 0.0–1.0)

## 2018-09-21 LAB — LIPID PANEL
CHOL/HDL RATIO: 3
Cholesterol: 151 mg/dL (ref 0–200)
HDL: 57.2 mg/dL (ref >39.00)
LDL Cholesterol: 83 mg/dL (ref 0–99)
NONHDL: 93.81
TRIGLYCERIDES: 53 mg/dL (ref 0.0–149.0)
VLDL: 10.6 mg/dL (ref 0.0–40.0)

## 2018-09-21 LAB — HEMOGLOBIN A1C: Hgb A1c MFr Bld: 6.2 % (ref 4.6–6.5)

## 2018-09-21 LAB — VITAMIN D 25 HYDROXY (VIT D DEFICIENCY, FRACTURES): VITD: 19.6 ng/mL — ABNORMAL LOW (ref 30.00–100.00)

## 2018-09-21 LAB — URIC ACID: URIC ACID, SERUM: 9.7 mg/dL — AB (ref 2.4–7.0)

## 2018-09-21 LAB — TSH: TSH: 1.82 u[IU]/mL (ref 0.35–4.50)

## 2018-09-21 LAB — MICROALBUMIN / CREATININE URINE RATIO
CREATININE, U: 102.4 mg/dL
MICROALB UR: 0.8 mg/dL (ref 0.0–1.9)
MICROALB/CREAT RATIO: 0.7 mg/g (ref 0.0–30.0)

## 2018-09-21 MED ORDER — CHOLECALCIFEROL 50 MCG (2000 UT) PO TABS: 1.0000 | ORAL_TABLET | Freq: Every day | ORAL | 1 refills | Status: DC

## 2018-09-21 MED ORDER — NEBIVOLOL HCL 5 MG PO TABS: 5.0000 mg | ORAL_TABLET | Freq: Every day | ORAL | 0 refills | Status: DC

## 2018-09-21 MED ORDER — ATORVASTATIN CALCIUM 10 MG PO TABS: 10.0000 mg | ORAL_TABLET | Freq: Every day | ORAL | 1 refills | Status: DC

## 2018-09-21 NOTE — Progress Notes (Signed)
Subjective:  Patient ID: Sarah [name], female    DOB: 07-07-1941  Age: 77 y.o. MRN: [id number]  CC: Hypertension; Headache; Annual Exam; and Diabetes   HPI Sarah [name] presents for a CPX.  Sarah [name] complains that her blood pressure is not well controlled.  According to her prescription refills Sarah [name] would have run out of metoprolol about 3 months ago.  Sarah [name] complains of a several week history of headache, ataxia, and declining memory.  Sarah [name] denies any recent episodes of palpitations, CP, DOE, dizziness, lightheadedness, or fatigue.  Past Medical History:  Diagnosis Date  . Allergic rhinitis   . Arthritis   . Cataract   . Chronic back pain   . Contact dermatitis and other eczema, due to unspecified cause   . Diabetes mellitus without complication ([place])   . Dysrhythmia    hx of heart beating fast   . History of sinusitis   . Hx of adenomatous colonic polyps 10/17/2014  . Hypertension   . Peripheral vascular disease (HCC)    hx of DVT - left leg   . Personal history of unspecified circulatory disease   . Plantar fascial fibromatosis   . Shortness of breath    with exertion    Past Surgical History:  Procedure Laterality Date  . ABDOMINAL HYSTERECTOMY    . CATARACT EXTRACTION Bilateral   . FLEXIBLE SIGMOIDOSCOPY    . FOOT SURGERY Bilateral   . History of Lumbar fusion     '87  . SHOULDER ARTHROSCOPY WITH ROTATOR CUFF REPAIR AND SUBACROMIAL DECOMPRESSION Left 01/14/2013   Procedure: Left Shoulder Arthroscopy with Debridement, Subacromial Decompression,rotator cuff repair;  Surgeon: Mcarthur Rossetti, MD;  Location: [facility];  Service: Orthopedics;  Laterality: Left;  Left Shoulder Arthroscopy with Debridement, Subacromial Decompression    reports that Sarah [name] has never smoked. Sarah [name] has never used smokeless tobacco. Sarah [name] reports that Sarah [name] does not drink alcohol or use drugs. family history includes Brain cancer in her father; Leukemia in her mother; Throat cancer in her  brother. Allergies  Allergen Reactions  . Lisinopril     REACTION: Lightheaded "sick"    Outpatient Medications Prior to Visit  Medication Sig Dispense Refill  . albuterol (PROVENTIL HFA;VENTOLIN HFA) 108 (90 Base) MCG/ACT inhaler Inhale 2 puffs into the lungs every 6 (six) hours as needed for wheezing or shortness of breath. 1 Inhaler 3  . azelastine (ASTELIN) 0.1 % nasal spray Place 2 sprays into both nostrils 2 (two) times daily. Use in each nostril as directed 30 mL 11  . bimatoprost (LUMIGAN) 0.01 % SOLN 1 drop at bedtime.    . chlorthalidone (HYGROTON) 25 MG tablet TAKE 1 TABLET(25 MG) BY MOUTH DAILY 90 tablet 0  . diltiazem (TIAZAC) 300 MG 24 hr capsule TAKE 1 CAPSULE BY MOUTH DAILY BEFORE BREAKFAST 90 capsule 1  . levocetirizine (XYZAL) 5 MG tablet Take 1 tablet (5 mg total) by mouth every evening. 90 tablet 1  . losartan (COZAAR) 100 MG tablet TAKE 1 TABLET BY MOUTH AFTER LUNCH 90 tablet 1  . mometasone (NASONEX) 50 MCG/ACT nasal spray Place 4 sprays into the nose daily. 51 g 1  . solifenacin (VESICARE) 10 MG tablet Take 1 tablet (10 mg total) daily by mouth. 90 tablet 1  . SYMBICORT 160-4.5 MCG/ACT inhaler INHALE 2 PUFFS INTO THE LUNGS TWICE DAILY 10.2 g 5  . zolpidem (AMBIEN) 10 MG tablet TAKE 1 TABLET(10 MG) BY MOUTH AT BEDTIME AS NEEDED FOR SLEEP 90 tablet 0  .  aspirin 81 MG tablet Take 1 tablet (81 mg total) by mouth daily. 30 tablet 6  . atorvastatin (LIPITOR) 10 MG tablet Take 1 tablet (10 mg total) by mouth daily. 90 tablet 3  . metoprolol tartrate (LOPRESSOR) 50 MG tablet Take 1 tablet (50 mg total) by mouth daily. 90 tablet 1   No facility-administered medications prior to visit.     ROS Review of Systems  Constitutional: Negative for diaphoresis, fatigue and unexpected weight change.  HENT: Negative.  Negative for trouble swallowing.   Eyes: Negative for visual disturbance.  Respiratory: Negative for cough, chest tightness, shortness of breath and wheezing.    Cardiovascular: Negative for chest pain, palpitations and leg swelling.  Gastrointestinal: Negative for abdominal pain, constipation, diarrhea, nausea and vomiting.  Endocrine: Negative.   Genitourinary: Negative.  Negative for difficulty urinating.  Musculoskeletal: Positive for gait problem. Negative for arthralgias and myalgias.  Skin: Negative.  Negative for color change.  Neurological: Positive for headaches. Negative for dizziness, speech difficulty, weakness and light-headedness.  Psychiatric/Behavioral: Positive for confusion and decreased concentration. Negative for agitation, behavioral problems, self-injury, sleep disturbance and suicidal ideas. The patient is not nervous/anxious and is not hyperactive.     Objective:  BP (!) 160/70 (BP Location: Left Arm, Patient Position: Sitting, Cuff Size: Normal)   Pulse 78   Temp 98.3 F (36.8 C) (Oral)   Resp 16   Ht 5\' 2"  (1.575 m)   Wt 153 lb (69.4 kg)   SpO2 99%   BMI 27.98 kg/m   BP Readings from Last 3 Encounters:  09/21/18 (!) 160/70  06/29/18 (!) 162/70  03/23/18 138/82    Wt Readings from Last 3 Encounters:  09/21/18 153 lb (69.4 kg)  06/29/18 152 lb (68.9 kg)  03/23/18 152 lb 4 oz (69.1 kg)    Physical Exam  Constitutional: Sarah [name] is oriented to person, place, and time. Sarah [name] appears well-developed and well-nourished. No distress.  HENT:  Mouth/Throat: Oropharynx is clear and moist.  Eyes: Pupils are equal, round, and reactive to light. EOM are normal. No scleral icterus.  Neck: Normal range of motion. Neck supple. No JVD present. No thyromegaly present.  Cardiovascular: Normal rate, regular rhythm and normal heart sounds. Exam reveals no gallop.  No murmur heard. EKG ---  Sinus  Rhythm  WITHIN NORMAL LIMITS  Pulmonary/Chest: Effort normal and breath sounds normal. No respiratory distress. Sarah [name] has no wheezes. Sarah [name] has no rhonchi. Sarah [name] has no rales.  Abdominal: Soft. Bowel sounds are normal. Sarah [name] exhibits no mass.  There is no hepatosplenomegaly. There is no tenderness.  Musculoskeletal: Normal range of motion. Sarah [name] exhibits no edema, tenderness or deformity.  Lymphadenopathy:    Sarah [name] has no cervical adenopathy.  Neurological: Sarah [name] is alert and oriented to person, place, and time. Sarah [name] displays normal reflexes. No cranial nerve deficit or sensory deficit. Sarah [name] exhibits normal muscle tone. Coordination normal.  Skin: Skin is warm and dry. Sarah [name] is not diaphoretic.  Vitals reviewed.   Lab Results  Component Value Date   WBC 6.8 09/21/2018   HGB 12.1 09/21/2018   HCT 36.0 09/21/2018   PLT 254.0 09/21/2018   GLUCOSE 90 09/21/2018   CHOL 151 09/21/2018   TRIG 53.0 09/21/2018   HDL 57.20 09/21/2018   LDLCALC 83 09/21/2018   ALT 11 09/21/2018   AST 14 09/21/2018   NA 138 09/21/2018   K 4.4 09/21/2018   CL 106 09/21/2018   CREATININE 1.84 (H) 09/21/2018   BUN 51 (H) 09/21/2018  CO2 22 09/21/2018   TSH 1.82 09/21/2018   HGBA1C 6.2 09/21/2018   MICROALBUR 0.8 09/21/2018    Dg Chest 2 View  Result Date: 05/01/2017 CLINICAL DATA:  Subacute onset of cough, congestion and dyspnea. Initial encounter. EXAM: CHEST  2 VIEW COMPARISON:  Chest radiograph from 01/24/2016 FINDINGS: The lungs are well-aerated. Mild peribronchial thickening is noted. There is no evidence of focal opacification, pleural effusion or pneumothorax. The heart is normal in size; the mediastinal contour is within normal limits. No acute osseous abnormalities are seen. IMPRESSION: Mild peribronchial thickening noted.  Lungs otherwise clear. Electronically Signed   By: Garald Balding M.D.   On: 05/01/2017 04:57    Assessment & Plan:   Sarah [name] was seen today for hypertension, headache, annual exam and diabetes.  Diagnoses and all orders for this visit:  Type II diabetes mellitus with manifestations (HCC)-her blood sugars are adequately well controlled.  Medical therapy is not indicated. -     Hemoglobin A1c; Future -     Comprehensive  metabolic panel; Future -     Microalbumin / creatinine urine ratio; Future  Essential hypertension- Her BP is not adequately well controlled.  It appears Sarah [name] is not compliant with metoprolol.  I have asked her to start nebivolol and to continue the other 2 agents.  Her EKG is negative for LVH or ischemia.  I will also treat the vitamin D deficiency.  Her renal function has declined slightly so have asked her to see nephrology for further evaluation of this.  Her electrolytes are normal. -     CBC with Differential/Platelet; Future -     Comprehensive metabolic panel; Future -     Urinalysis, Routine w reflex microscopic; Future -     VITAMIN D 25 Hydroxy (Vit-D Deficiency, Fractures); Future -     EKG 12-Lead -     nebivolol (BYSTOLIC) 5 MG tablet; Take 1 tablet (5 mg total) by mouth daily.  Hyperlipidemia with target LDL less than 100-Sarah [name] has an elevated ASCVD risk score and is not taking a statin.  I have asked her to start a statin for CV risk reduction. -     Lipid panel; Future -     TSH; Future -     atorvastatin (LIPITOR) 10 MG tablet; Take 1 tablet (10 mg total) by mouth daily.  Gastroesophageal reflux disease with esophagitis- Her symptoms are well controlled.  No complications noted. 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 -     Uric acid; Future  Need for influenza vaccination -     Flu vaccine HIGH DOSE PF (Fluzone High dose)  Ataxia- I have asked her to undergo an MRI of her brain to see if Sarah [name] has NPH, CVA, mass, or demyelinating disease. -     MR Brain Wo Contrast; Future  Cerebellar ataxia in diseases classified elsewhere ([place])- As above. -     MR Brain Wo Contrast; Future  CRI (chronic renal insufficiency), stage 3 (moderate) (HCC)- Sarah [name] has prerenal azotemia.  I have asked her to liberalize her intake of liquids.  We will attempt to get better blood pressure control.  I have also asked her to  see nephrology for ongoing evaluation and treatment of this. -     Ambulatory referral to Nephrology  Vitamin D deficiency disease -     Cholecalciferol 2000 units TABS; Take 1 tablet (2,000 Units total) by mouth  daily.  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 Stidd's aspirin and metoprolol tartrate. I am also having her start on nebivolol and Cholecalciferol. Additionally, I am having her maintain her azelastine, albuterol, bimatoprost, solifenacin, levocetirizine, mometasone, SYMBICORT, chlorthalidone, losartan, zolpidem, diltiazem, and atorvastatin.  Meds ordered this encounter  Medications  . nebivolol (BYSTOLIC) 5 MG tablet    Sig: Take 1 tablet (5 mg total) by mouth daily.    Dispense:  70 tablet    Refill:  0  . Cholecalciferol 2000 units TABS    Sig: Take 1 tablet (2,000 Units total) by mouth daily.    Dispense:  90 tablet    Refill:  1  . atorvastatin (LIPITOR) 10 MG tablet    Sig: Take 1 tablet (10 mg total) by mouth daily.    Dispense:  90 tablet    Refill:  1   See AVS for instructions about healthy living and anticipatory guidance.  Follow-up: Return in about 2 months (around 11/21/2018).  Scarlette Calico, MD

## 2018-09-21 NOTE — Patient Instructions (Signed)

## 2018-09-22 NOTE — Assessment & Plan Note (Signed)

## 2018-10-04 ENCOUNTER — Telehealth: Payer: Self-pay | Admitting: Internal Medicine

## 2018-10-04 NOTE — Telephone Encounter (Signed)
Pt

## 2018-10-04 NOTE — Telephone Encounter (Signed)
Copied from [place] [id number]. Topic: General - Other >> Oct 04, 2018 10:24 AM Leward Quan A wrote: Reason for CRM: Patient called to say that the atorvastatin (LIPITOR) 10 MG tablet is making her really tired and lightheaded when she stand up. Also say that she is having a flare up of gout  in her left foot. Request a call back from nurse or doctor.    Ph# [phone]

## 2018-10-05 NOTE — Telephone Encounter (Signed)
It does not sound to me like her symptoms are related to the cholesterol medicine.  If she thinks she has a gout in attack in her foot will she come in and let someone check out her foot?

## 2018-10-05 NOTE — Telephone Encounter (Signed)
Stef. - can you help with this? 

## 2018-10-05 NOTE — Telephone Encounter (Signed)
Pt is coming in tomorrow morning   

## 2018-10-06 ENCOUNTER — Ambulatory Visit: Payer: Medicare Other | Admitting: Internal Medicine

## 2018-10-06 ENCOUNTER — Encounter: Payer: Self-pay | Admitting: Internal Medicine

## 2018-10-06 VITALS — BP 160/72 | HR 86 | Temp 98.1°F | Resp 16 | Ht 62.0 in | Wt 151.2 lb

## 2018-10-06 DIAGNOSIS — M19072 Primary osteoarthritis, left ankle and foot: Secondary | ICD-10-CM

## 2018-10-06 DIAGNOSIS — M19071 Primary osteoarthritis, right ankle and foot: Secondary | ICD-10-CM

## 2018-10-06 DIAGNOSIS — I10 Essential (primary) hypertension: Secondary | ICD-10-CM

## 2018-10-06 MED ORDER — TRAMADOL HCL 50 MG PO TABS: 50.0000 mg | ORAL_TABLET | Freq: Four times a day (QID) | ORAL | 3 refills | Status: DC | PRN

## 2018-10-06 NOTE — Patient Instructions (Signed)

## 2018-10-06 NOTE — Progress Notes (Signed)
Subjective:  Patient ID: Sarah [name], female    DOB: [birth year]  Age: 77 y.o. MRN: [id number]  CC: Hypertension   HPI Sarah [name] presents for a BP check - She tells me she is compliant with her for current antihypertensives.  She tells me she has an upcoming appointment soon with nephrology.  She also complains of chronic pain in both feet.  She denies any recent episodes of redness or swelling.  She is recently experienced some episodes of lightheadedness and she blames it on the statin so she is decided to stop taking it.  Outpatient Medications Prior to Visit  Medication Sig Dispense Refill  . albuterol (PROVENTIL HFA;VENTOLIN HFA) 108 (90 Base) MCG/ACT inhaler Inhale 2 puffs into the lungs every 6 (six) hours as needed for wheezing or shortness of breath. 1 Inhaler 3  . azelastine (ASTELIN) 0.1 % nasal spray Place 2 sprays into both nostrils 2 (two) times daily. Use in each nostril as directed 30 mL 11  . bimatoprost (LUMIGAN) 0.01 % SOLN 1 drop at bedtime.    . chlorthalidone (HYGROTON) 25 MG tablet TAKE 1 TABLET(25 MG) BY MOUTH DAILY 90 tablet 0  . Cholecalciferol 2000 units TABS Take 1 tablet (2,000 Units total) by mouth daily. 90 tablet 1  . diltiazem (TIAZAC) 300 MG 24 hr capsule TAKE 1 CAPSULE BY MOUTH DAILY BEFORE BREAKFAST 90 capsule 1  . levocetirizine (XYZAL) 5 MG tablet Take 1 tablet (5 mg total) by mouth every evening. 90 tablet 1  . losartan (COZAAR) 100 MG tablet TAKE 1 TABLET BY MOUTH AFTER LUNCH 90 tablet 1  . mometasone (NASONEX) 50 MCG/ACT nasal spray Place 4 sprays into the nose daily. 51 g 1  . nebivolol (BYSTOLIC) 5 MG tablet Take 1 tablet (5 mg total) by mouth daily. 70 tablet 0  . solifenacin (VESICARE) 10 MG tablet Take 1 tablet (10 mg total) daily by mouth. 90 tablet 1  . SYMBICORT 160-4.5 MCG/ACT inhaler INHALE 2 PUFFS INTO THE LUNGS TWICE DAILY 10.2 g 5  . zolpidem (AMBIEN) 10 MG tablet TAKE 1 TABLET(10 MG) BY MOUTH AT BEDTIME AS NEEDED FOR SLEEP 90 tablet  0  . atorvastatin (LIPITOR) 10 MG tablet Take 1 tablet (10 mg total) by mouth daily. 90 tablet 1   No facility-administered medications prior to visit.     ROS Review of Systems  Constitutional: Negative.  Negative for diaphoresis, fatigue and fever.  HENT: Negative.   Eyes: Negative for visual disturbance.  Respiratory: Negative for cough, chest tightness and shortness of breath.   Cardiovascular: Negative for chest pain, palpitations and leg swelling.  Gastrointestinal: Negative for abdominal pain, constipation, diarrhea, nausea and vomiting.  Genitourinary: Negative.   Musculoskeletal: Negative.  Negative for arthralgias and myalgias.  Skin: Negative.   Neurological: Positive for dizziness and light-headedness. Negative for weakness, numbness and headaches.  Hematological: Negative for adenopathy. Does not bruise/bleed easily.  Psychiatric/Behavioral: Negative.     Objective:  BP (!) 160/72 (BP Location: Left Arm, Patient Position: Sitting, Cuff Size: Normal)   Pulse 86   Temp 98.1 F (36.7 C) (Oral)   Resp 16   Ht 5\' 2"  (1.575 m)   Wt 151 lb 4 oz (68.6 kg)   SpO2 99%   BMI 27.66 kg/m   BP Readings from Last 3 Encounters:  10/06/18 (!) 160/72  09/21/18 (!) 160/70  06/29/18 (!) 162/70    Wt Readings from Last 3 Encounters:  10/06/18 151 lb 4 oz (68.6 kg)  09/21/18 153 lb (69.4 kg)  06/29/18 152 lb (68.9 kg)    Physical Exam  Constitutional: She is oriented to person, place, and time. No distress.  HENT:  Mouth/Throat: Oropharynx is clear and moist. No oropharyngeal exudate.  Eyes: Conjunctivae are normal. No scleral icterus.  Neck: Normal range of motion. Neck supple. No JVD present. No thyromegaly present.  Cardiovascular: Normal rate, regular rhythm and normal heart sounds. Exam reveals no gallop.  No murmur heard. Pulmonary/Chest: Effort normal and breath sounds normal. No respiratory distress. She has no wheezes. She has no rhonchi. She has no rales.    Abdominal: Soft. Bowel sounds are normal. She exhibits no mass. There is no tenderness.  Musculoskeletal: Normal range of motion. She exhibits no edema, tenderness or deformity.  There are degenerative changes in both feet mostly in the first MTP joints.  There are no joints that are red, swollen, or tender.  Lymphadenopathy:    She has no cervical adenopathy.  Neurological: She is alert and oriented to person, place, and time.  Skin: Skin is warm and dry. She is not diaphoretic. No pallor.  Vitals reviewed.   Lab Results  Component Value Date   WBC 6.8 09/21/2018   HGB 12.1 09/21/2018   HCT 36.0 09/21/2018   PLT 254.0 09/21/2018   GLUCOSE 90 09/21/2018   CHOL 151 09/21/2018   TRIG 53.0 09/21/2018   HDL 57.20 09/21/2018   LDLCALC 83 09/21/2018   ALT 11 09/21/2018   AST 14 09/21/2018   NA 138 09/21/2018   K 4.4 09/21/2018   CL 106 09/21/2018   CREATININE 1.84 (H) 09/21/2018   BUN 51 (H) 09/21/2018   CO2 22 09/21/2018   TSH 1.82 09/21/2018   HGBA1C 6.2 09/21/2018   MICROALBUR 0.8 09/21/2018    Dg Chest 2 View  Result Date: 05/01/2017 CLINICAL DATA:  Subacute onset of cough, congestion and dyspnea. Initial encounter. EXAM: CHEST  2 VIEW COMPARISON:  Chest radiograph from 01/24/2016 FINDINGS: The lungs are well-aerated. Mild peribronchial thickening is noted. There is no evidence of focal opacification, pleural effusion or pneumothorax. The heart is normal in size; the mediastinal contour is within normal limits. No acute osseous abnormalities are seen. IMPRESSION: Mild peribronchial thickening noted.  Lungs otherwise clear. Electronically Signed   By: Garald Balding M.D.   On: 05/01/2017 04:57    Assessment & Plan:   Sarah [name] was seen today for hypertension.  Diagnoses and all orders for this visit:  Essential hypertension- Her blood pressure is not adequately well controlled on 4 agents.  She also has prerenal azotemia with renal insufficiency.  She tells me she will be  seeing nephrology soon about this.  Primary osteoarthritis of both feet- Due to her hypertension and renal insufficiency she will need to avoid NSAIDs.  I recommended that she try to control the pain with tramadol as needed. -     traMADol (ULTRAM) 50 MG tablet; Take 1 tablet (50 mg total) by mouth every 6 (six) hours as needed.   I have discontinued [place] atorvastatin. I am also having her start on traMADol. Additionally, I am having her maintain her azelastine, albuterol, bimatoprost, solifenacin, levocetirizine, mometasone, SYMBICORT, chlorthalidone, losartan, zolpidem, diltiazem, nebivolol, and Cholecalciferol.  Meds ordered this encounter  Medications  . traMADol (ULTRAM) 50 MG tablet    Sig: Take 1 tablet (50 mg total) by mouth every 6 (six) hours as needed.    Dispense:  65 tablet    Refill:  3  Follow-up: Return in about 4 weeks (around 11/03/2018).  Scarlette Calico, MD

## 2018-10-12 ENCOUNTER — Other Ambulatory Visit: Payer: Medicare Other

## 2018-10-13 ENCOUNTER — Other Ambulatory Visit: Payer: Medicare Other

## 2018-10-16 ENCOUNTER — Other Ambulatory Visit: Payer: Self-pay | Admitting: Internal Medicine

## 2018-10-16 DIAGNOSIS — I10 Essential (primary) hypertension: Secondary | ICD-10-CM

## 2018-10-23 ENCOUNTER — Ambulatory Visit
Admission: RE | Admit: 2018-10-23 | Discharge: 2018-10-23 | Disposition: A | Discharge status: Home / Self Care | Payer: Medicare Other | Source: Ambulatory Visit | Attending: Internal Medicine | Admitting: Internal Medicine

## 2018-10-23 DIAGNOSIS — G3281 Cerebellar ataxia in diseases classified elsewhere: Secondary | ICD-10-CM | POA: Diagnosis not present

## 2018-10-23 DIAGNOSIS — R270 Ataxia, unspecified: Secondary | ICD-10-CM

## 2018-10-25 ENCOUNTER — Telehealth: Payer: Self-pay

## 2018-10-25 NOTE — Telephone Encounter (Signed)
Spoke to pt regarding results. Pt is requesting an rx for her cough syrup (Hycodan). Pt states it is the same cough that she had in April. Nasal drainage that is causing her to cough.   Please advise.

## 2018-11-03 DIAGNOSIS — N183 Chronic kidney disease, stage 3 (moderate): Secondary | ICD-10-CM | POA: Diagnosis not present

## 2018-11-03 DIAGNOSIS — Z1231 Encounter for screening mammogram for malignant neoplasm of breast: Secondary | ICD-10-CM | POA: Diagnosis not present

## 2018-11-03 DIAGNOSIS — Z01419 Encounter for gynecological examination (general) (routine) without abnormal findings: Secondary | ICD-10-CM | POA: Diagnosis not present

## 2018-11-03 DIAGNOSIS — Z6826 Body mass index (BMI) 26.0-26.9, adult: Secondary | ICD-10-CM | POA: Diagnosis not present

## 2018-11-09 ENCOUNTER — Other Ambulatory Visit: Payer: Self-pay | Admitting: Nephrology

## 2018-11-09 DIAGNOSIS — N183 Chronic kidney disease, stage 3 unspecified: Secondary | ICD-10-CM

## 2018-11-10 ENCOUNTER — Ambulatory Visit
Admission: RE | Admit: 2018-11-10 | Discharge: 2018-11-10 | Disposition: A | Discharge status: Home / Self Care | Payer: Medicare Other | Source: Ambulatory Visit | Attending: Nephrology | Admitting: Nephrology

## 2018-11-10 DIAGNOSIS — N183 Chronic kidney disease, stage 3 unspecified: Secondary | ICD-10-CM

## 2018-11-10 DIAGNOSIS — I129 Hypertensive chronic kidney disease with stage 1 through stage 4 chronic kidney disease, or unspecified chronic kidney disease: Secondary | ICD-10-CM | POA: Diagnosis not present

## 2018-11-25 ENCOUNTER — Other Ambulatory Visit: Payer: Self-pay | Admitting: Internal Medicine

## 2018-11-25 DIAGNOSIS — F5105 Insomnia due to other mental disorder: Principal | ICD-10-CM

## 2018-11-25 DIAGNOSIS — F409 Phobic anxiety disorder, unspecified: Secondary | ICD-10-CM

## 2018-12-06 DIAGNOSIS — N183 Chronic kidney disease, stage 3 (moderate): Secondary | ICD-10-CM | POA: Diagnosis not present

## 2018-12-06 LAB — CBC AND DIFFERENTIAL: Hemoglobin: 10.6 — AB (ref 12.0–16.0)

## 2018-12-06 LAB — BASIC METABOLIC PANEL
Creatinine: 1.6 — AB (ref 0.5–1.1)
Potassium: 5 (ref 3.4–5.3)

## 2018-12-14 DIAGNOSIS — E1122 Type 2 diabetes mellitus with diabetic chronic kidney disease: Secondary | ICD-10-CM | POA: Diagnosis not present

## 2018-12-14 DIAGNOSIS — N183 Chronic kidney disease, stage 3 (moderate): Secondary | ICD-10-CM | POA: Diagnosis not present

## 2018-12-14 DIAGNOSIS — N179 Acute kidney failure, unspecified: Secondary | ICD-10-CM | POA: Diagnosis not present

## 2018-12-14 DIAGNOSIS — I129 Hypertensive chronic kidney disease with stage 1 through stage 4 chronic kidney disease, or unspecified chronic kidney disease: Secondary | ICD-10-CM | POA: Diagnosis not present

## 2018-12-27 ENCOUNTER — Telehealth: Payer: Self-pay

## 2018-12-27 DIAGNOSIS — I10 Essential (primary) hypertension: Secondary | ICD-10-CM

## 2018-12-27 NOTE — Telephone Encounter (Signed)
Copied from [facility] [id number]. Topic: General - Other >> Dec 27, 2018 11:00 AM Leward Quan A wrote: Reason for CRM: Patient called to say the nebivolol (BYSTOLIC) 5 MG tablet is running out  and she need to know what to do. She also stated that the losartan (COZAAR) 100 MG tablet need to be changed to 50mg . Request a call back please   Ph#  [phone]

## 2018-12-27 NOTE — Telephone Encounter (Signed)
Pt informed that she is due for a follow up. I have samples upfront for pt to pick up. Appt has been scheduled.

## 2019-01-12 ENCOUNTER — Other Ambulatory Visit: Payer: Self-pay | Admitting: Internal Medicine

## 2019-01-12 DIAGNOSIS — I10 Essential (primary) hypertension: Secondary | ICD-10-CM

## 2019-01-18 ENCOUNTER — Other Ambulatory Visit: Payer: Self-pay | Admitting: Internal Medicine

## 2019-01-18 ENCOUNTER — Ambulatory Visit: Payer: Medicare Other | Admitting: Internal Medicine

## 2019-01-18 ENCOUNTER — Encounter: Payer: Self-pay | Admitting: Internal Medicine

## 2019-01-18 ENCOUNTER — Other Ambulatory Visit (INDEPENDENT_AMBULATORY_CARE_PROVIDER_SITE_OTHER): Payer: Medicare Other

## 2019-01-18 VITALS — BP 162/72 | HR 83 | Temp 98.1°F | Ht 62.0 in | Wt 147.2 lb

## 2019-01-18 DIAGNOSIS — I10 Essential (primary) hypertension: Secondary | ICD-10-CM

## 2019-01-18 DIAGNOSIS — D539 Nutritional anemia, unspecified: Secondary | ICD-10-CM

## 2019-01-18 DIAGNOSIS — N183 Chronic kidney disease, stage 3 unspecified: Secondary | ICD-10-CM

## 2019-01-18 DIAGNOSIS — E118 Type 2 diabetes mellitus with unspecified complications: Secondary | ICD-10-CM | POA: Diagnosis not present

## 2019-01-18 DIAGNOSIS — J4540 Moderate persistent asthma, uncomplicated: Secondary | ICD-10-CM

## 2019-01-18 LAB — CBC WITH DIFFERENTIAL/PLATELET
BASOS ABS: 0 10*3/uL (ref 0.0–0.1)
Basophils Relative: 0.4 % (ref 0.0–3.0)
Eosinophils Absolute: 0.2 10*3/uL (ref 0.0–0.7)
Eosinophils Relative: 3.6 % (ref 0.0–5.0)
HCT: 35.1 % — ABNORMAL LOW (ref 36.0–46.0)
Hemoglobin: 11.6 g/dL — ABNORMAL LOW (ref 12.0–15.0)
Lymphocytes Relative: 26 % (ref 12.0–46.0)
Lymphs Abs: 1.4 10*3/uL (ref 0.7–4.0)
MCHC: 33.2 g/dL (ref 30.0–36.0)
MCV: 90.2 fl (ref 78.0–100.0)
Monocytes Absolute: 0.4 10*3/uL (ref 0.1–1.0)
Monocytes Relative: 7.5 % (ref 3.0–12.0)
Neutro Abs: 3.3 10*3/uL (ref 1.4–7.7)
Neutrophils Relative %: 62.5 % (ref 43.0–77.0)
Platelets: 220 10*3/uL (ref 150.0–400.0)
RBC: 3.89 Mil/uL (ref 3.87–5.11)
RDW: 13.4 % (ref 11.5–15.5)
WBC: 5.3 10*3/uL (ref 4.0–10.5)

## 2019-01-18 LAB — IBC PANEL
Iron: 65 ug/dL (ref 42–145)
SATURATION RATIOS: 20.5 % (ref 20.0–50.0)
Transferrin: 226 mg/dL (ref 212.0–360.0)

## 2019-01-18 LAB — BASIC METABOLIC PANEL
BUN: 36 mg/dL — ABNORMAL HIGH (ref 6–23)
CO2: 20 mEq/L (ref 19–32)
Calcium: 9.6 mg/dL (ref 8.4–10.5)
Chloride: 108 mEq/L (ref 96–112)
Creatinine, Ser: 1.77 mg/dL — ABNORMAL HIGH (ref 0.40–1.20)
GFR: 33.67 mL/min — ABNORMAL LOW (ref >60.00)
Glucose, Bld: 87 mg/dL (ref 70–99)
Potassium: 4.2 mEq/L (ref 3.5–5.1)
Sodium: 140 mEq/L (ref 135–145)

## 2019-01-18 LAB — FERRITIN: Ferritin: 37 ng/mL (ref 10.0–291.0)

## 2019-01-18 LAB — FOLATE: Folate: 22.7 ng/mL (ref >5.9)

## 2019-01-18 LAB — HEMOGLOBIN A1C: Hgb A1c MFr Bld: 5.8 % (ref 4.6–6.5)

## 2019-01-18 LAB — VITAMIN B12: Vitamin B-12: 471 pg/mL (ref 211–911)

## 2019-01-18 MED ORDER — LOSARTAN POTASSIUM 50 MG PO TABS: 50.0000 mg | ORAL_TABLET | Freq: Every day | ORAL | 1 refills | Status: DC

## 2019-01-18 MED ORDER — NEBIVOLOL HCL 10 MG PO TABS: 10.0000 mg | ORAL_TABLET | Freq: Every day | ORAL | 1 refills | Status: DC

## 2019-01-18 NOTE — Patient Instructions (Signed)

## 2019-01-18 NOTE — Progress Notes (Signed)
Subjective:  Patient ID: Sarah [name], female    DOB: June 21, 1941  Age: 78 y.o. MRN: [id number]  CC: Anemia and Hypertension   HPI Sarah [name] presents for f/up - She continues to complain of intermittent headaches and tells me her blood pressure has not been well controlled.  He saw a nephrologist and was advised to stop taking the thiazide diuretic.  She was also told to decrease the losartan dose by 50%.  She tells me she is compliant with hydralazine, diltiazem, and nebivolol.  She denies blurred vision, CP, DOE, edema, or fatigue.  Outpatient Medications Prior to Visit  Medication Sig Dispense Refill  . atorvastatin (LIPITOR) 10 MG tablet     . azelastine (ASTELIN) 0.1 % nasal spray Place 2 sprays into both nostrils 2 (two) times daily. Use in each nostril as directed 30 mL 11  . Cholecalciferol 2000 units TABS Take 1 tablet (2,000 Units total) by mouth daily. 90 tablet 1  . diltiazem (TIAZAC) 300 MG 24 hr capsule TAKE 1 CAPSULE BY MOUTH DAILY BEFORE BREAKFAST 90 capsule 1  . hydrALAZINE (APRESOLINE) 25 MG tablet TK 1 T PO TID    . prednisoLONE acetate (PRED FORTE) 1 % ophthalmic suspension SHAKE LQ AND INT 1 GTT IN OS FOUR TIMES DAILY. START AFTER SURGERY    . SYMBICORT 160-4.5 MCG/ACT inhaler INHALE 2 PUFFS INTO THE LUNGS TWICE DAILY 10.2 g 5  . traMADol (ULTRAM) 50 MG tablet Take 1 tablet (50 mg total) by mouth every 6 (six) hours as needed. 65 tablet 3  . zolpidem (AMBIEN) 10 MG tablet TAKE 1 TABLET(10 MG) BY MOUTH AT BEDTIME AS NEEDED FOR SLEEP 30 tablet 0  . losartan (COZAAR) 100 MG tablet TAKE 1 TABLET BY MOUTH AFTER LUNCH 90 tablet 1  . nebivolol (BYSTOLIC) 5 MG tablet Take 1 tablet (5 mg total) by mouth daily. 70 tablet 0  . albuterol (PROVENTIL HFA;VENTOLIN HFA) 108 (90 Base) MCG/ACT inhaler Inhale 2 puffs into the lungs every 6 (six) hours as needed for wheezing or shortness of breath. (Patient not taking: Reported on 01/18/2019) 1 Inhaler 3  . bimatoprost (LUMIGAN) 0.01 %  SOLN 1 drop at bedtime.    Marland Kitchen levocetirizine (XYZAL) 5 MG tablet Take 1 tablet (5 mg total) by mouth every evening. 90 tablet 1  . mometasone (NASONEX) 50 MCG/ACT nasal spray Place 4 sprays into the nose daily. 51 g 1  . solifenacin (VESICARE) 10 MG tablet Take 1 tablet (10 mg total) daily by mouth. 90 tablet 1   No facility-administered medications prior to visit.     ROS Review of Systems  Constitutional: Negative.  Negative for diaphoresis, fatigue and unexpected weight change.  HENT: Negative.   Eyes: Negative for visual disturbance.  Respiratory: Negative for cough, chest tightness, shortness of breath and wheezing.   Cardiovascular: Negative for chest pain, palpitations and leg swelling.  Gastrointestinal: Negative for abdominal pain, diarrhea, nausea and vomiting.  Genitourinary: Negative.  Negative for difficulty urinating.  Musculoskeletal: Negative for arthralgias and myalgias.  Skin: Negative.  Negative for color change, pallor and rash.  Neurological: Positive for headaches. Negative for dizziness, weakness and light-headedness.  Hematological: Negative for adenopathy. Does not bruise/bleed easily.  Psychiatric/Behavioral: Positive for dysphoric mood and sleep disturbance. Negative for confusion, decreased concentration, self-injury and suicidal ideas. The patient is nervous/anxious.     Objective:  BP (!) 162/72 (BP Location: Left Arm, Patient Position: Sitting, Cuff Size: Normal)   Pulse 83   Temp  98.1 F (36.7 C) (Oral)   Ht 5\' 2"  (1.575 m)   Wt 147 lb 4 oz (66.8 kg)   SpO2 99%   BMI 26.93 kg/m   BP Readings from Last 3 Encounters:  01/18/19 (!) 162/72  10/06/18 (!) 160/72  09/21/18 (!) 160/70    Wt Readings from Last 3 Encounters:  01/18/19 147 lb 4 oz (66.8 kg)  10/06/18 151 lb 4 oz (68.6 kg)  09/21/18 153 lb (69.4 kg)    Physical Exam Vitals signs reviewed.  Constitutional:      Appearance: She is not ill-appearing or diaphoretic.  HENT:      Mouth/Throat:     Mouth: Mucous membranes are dry.  Eyes:     General: No scleral icterus.    Extraocular Movements: Extraocular movements intact.     Conjunctiva/sclera: Conjunctivae normal.     Pupils: Pupils are equal, round, and reactive to light.  Neck:     Musculoskeletal: Normal range of motion and neck supple. No muscular tenderness.  Cardiovascular:     Rate and Rhythm: Normal rate and regular rhythm.     Heart sounds: No murmur. No gallop.   Pulmonary:     Effort: Pulmonary effort is normal. No respiratory distress.     Breath sounds: No stridor. No wheezing, rhonchi or rales.  Abdominal:     General: Bowel sounds are normal.     Palpations: There is no hepatomegaly, splenomegaly or mass.     Tenderness: There is no abdominal tenderness.  Musculoskeletal: Normal range of motion.        General: No swelling.     Right lower leg: No edema.     Left lower leg: No edema.  Lymphadenopathy:     Cervical: No cervical adenopathy.  Skin:    General: Skin is warm and dry.  Neurological:     General: No focal deficit present.     Mental Status: Mental status is at baseline.  Psychiatric:        Attention and Perception: Attention normal.        Mood and Affect: Affect normal. Mood is anxious.        Speech: Speech normal.        Behavior: Behavior normal. Behavior is cooperative.        Thought Content: Thought content normal. Thought content is not paranoid or delusional. Thought content does not include homicidal or suicidal ideation.        Cognition and Memory: Cognition normal.        Judgment: Judgment normal.     Lab Results  Component Value Date   WBC 5.3 01/18/2019   HGB 11.6 (L) 01/18/2019   HCT 35.1 (L) 01/18/2019   PLT 220.0 01/18/2019   GLUCOSE 87 01/18/2019   CHOL 151 09/21/2018   TRIG 53.0 09/21/2018   HDL 57.20 09/21/2018   LDLCALC 83 09/21/2018   ALT 11 09/21/2018   AST 14 09/21/2018   NA 140 01/18/2019   K 4.2 01/18/2019   CL 108 01/18/2019    CREATININE 1.77 (H) 01/18/2019   BUN 36 (H) 01/18/2019   CO2 20 01/18/2019   TSH 1.82 09/21/2018   HGBA1C 5.8 01/18/2019   MICROALBUR 0.8 09/21/2018    US Renal  Result Date: 11/11/2018 CLINICAL DATA:  Stage III chronic kidney disease, hypertension, diabetes mellitus EXAM: RENAL / URINARY TRACT ULTRASOUND COMPLETE COMPARISON:  None FINDINGS: Right Kidney: Renal measurements: 7.8 x 4.0 x 4.0 cm = volume: 65.8 mL. Cortical  thinning. Increased cortical echogenicity. No mass, hydronephrosis or shadowing calcification Left Kidney: Renal measurements: 8.9 x 4.5 x 4.5 cm = volume: 92.1 mL. Cortical thinning. Increased cortical echogenicity. Simple appearing cyst at LEFT kidney 4.4 x 4.1 x 4.5 cm. No additional mass or hydronephrosis. No shadowing calcification. Bladder: Appears normal for degree of bladder distention. 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 Simple appearing LEFT renal cyst 4.5 cm diameter. Electronically Signed   By: Lavonia Dana M.D.   On: 11/11/2018 09:28    Assessment & Plan:   Sarah [name] was seen today for anemia and hypertension.  Diagnoses and all orders for this visit:  Essential hypertension- Her blood pressure is not adequately well controlled and she is symptomatic with intermittent headaches.  I have decreased the losartan dose as requested by the nephrologist.  I have also recommended that she increase the dose of nebivolol from 5 mg a day to 10 mg a day. -     nebivolol (BYSTOLIC) 10 MG tablet; Take 1 tablet (10 mg total) by mouth daily. -     Basic metabolic panel; Future -     losartan (COZAAR) 50 MG tablet; Take 1 tablet (50 mg total) by mouth daily.  Type II diabetes mellitus with manifestations ([place])- Her blood sugars are very well controlled.  Medical therapy is not indicated. -     Basic metabolic panel; Future -     Hemoglobin A1c; Future -     losartan (COZAAR) 50 MG tablet; Take 1 tablet (50 mg total) by mouth  daily.  CRI (chronic renal insufficiency), stage 3 (moderate) (HCC)- her renal function has stabilized.  She will avoid nephrotoxic agents.  Will try to maintain better blood pressure control. 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  This is consistent with the anemia of renal insufficiency. -     CBC with Differential/Platelet; Future -     Vitamin B12; Future -     IBC panel; Future -     Folate; Future -     Ferritin; Future -     Vitamin B1; Future   I have discontinued Kashawn W. Fotheringham's bimatoprost, solifenacin, levocetirizine, mometasone, losartan, and nebivolol. I am also having her start on nebivolol and losartan. Additionally, I am having her maintain her azelastine, albuterol, diltiazem, Cholecalciferol, traMADol, zolpidem, SYMBICORT, atorvastatin, hydrALAZINE, and prednisoLONE acetate.  Meds ordered this encounter  Medications  . nebivolol (BYSTOLIC) 10 MG tablet    Sig: Take 1 tablet (10 mg total) by mouth daily.    Dispense:  90 tablet    Refill:  1  . losartan (COZAAR) 50 MG tablet    Sig: Take 1 tablet (50 mg total) by mouth daily.    Dispense:  90 tablet    Refill:  1     Follow-up: Return in about 3 months (around 04/18/2019).  Scarlette Calico, MD

## 2019-01-20 DIAGNOSIS — H401131 Primary open-angle glaucoma, bilateral, mild stage: Secondary | ICD-10-CM | POA: Diagnosis not present

## 2019-01-20 DIAGNOSIS — H40053 Ocular hypertension, bilateral: Secondary | ICD-10-CM | POA: Diagnosis not present

## 2019-01-20 DIAGNOSIS — H2013 Chronic iridocyclitis, bilateral: Secondary | ICD-10-CM | POA: Diagnosis not present

## 2019-01-20 LAB — HM DIABETES EYE EXAM

## 2019-01-21 ENCOUNTER — Encounter: Payer: Self-pay | Admitting: Internal Medicine

## 2019-01-21 LAB — VITAMIN B1: Vitamin B1 (Thiamine): 18 nmol/L (ref 8–30)

## 2019-01-26 ENCOUNTER — Other Ambulatory Visit: Payer: Self-pay

## 2019-01-26 DIAGNOSIS — F5105 Insomnia due to other mental disorder: Principal | ICD-10-CM

## 2019-01-26 DIAGNOSIS — F409 Phobic anxiety disorder, unspecified: Secondary | ICD-10-CM

## 2019-01-26 MED ORDER — ZOLPIDEM TARTRATE 10 MG PO TABS: 10.0000 mg | ORAL_TABLET | Freq: Every evening | ORAL | 5 refills | Status: DC | PRN

## 2019-01-26 NOTE — Telephone Encounter (Signed)
LOV 01/18/2019

## 2019-01-28 ENCOUNTER — Encounter: Payer: Self-pay | Admitting: Internal Medicine

## 2019-02-12 ENCOUNTER — Other Ambulatory Visit: Payer: Self-pay | Admitting: Internal Medicine

## 2019-02-12 DIAGNOSIS — I10 Essential (primary) hypertension: Secondary | ICD-10-CM

## 2019-03-14 ENCOUNTER — Telehealth: Payer: Self-pay

## 2019-03-14 ENCOUNTER — Other Ambulatory Visit: Payer: Self-pay | Admitting: Internal Medicine

## 2019-03-14 NOTE — Telephone Encounter (Signed)
Key: [id number]

## 2019-04-11 DIAGNOSIS — N183 Chronic kidney disease, stage 3 (moderate): Secondary | ICD-10-CM | POA: Diagnosis not present

## 2019-04-18 DIAGNOSIS — N183 Chronic kidney disease, stage 3 (moderate): Secondary | ICD-10-CM | POA: Diagnosis not present

## 2019-04-18 DIAGNOSIS — I129 Hypertensive chronic kidney disease with stage 1 through stage 4 chronic kidney disease, or unspecified chronic kidney disease: Secondary | ICD-10-CM | POA: Diagnosis not present

## 2019-04-18 DIAGNOSIS — D649 Anemia, unspecified: Secondary | ICD-10-CM | POA: Diagnosis not present

## 2019-04-18 DIAGNOSIS — E1122 Type 2 diabetes mellitus with diabetic chronic kidney disease: Secondary | ICD-10-CM | POA: Diagnosis not present

## 2019-04-19 DIAGNOSIS — N183 Chronic kidney disease, stage 3 (moderate): Secondary | ICD-10-CM | POA: Diagnosis not present

## 2019-07-19 ENCOUNTER — Other Ambulatory Visit: Payer: Self-pay | Admitting: Internal Medicine

## 2019-07-19 DIAGNOSIS — F409 Phobic anxiety disorder, unspecified: Secondary | ICD-10-CM

## 2019-07-19 DIAGNOSIS — F5105 Insomnia due to other mental disorder: Secondary | ICD-10-CM

## 2019-07-19 MED ORDER — ZOLPIDEM TARTRATE 10 MG PO TABS: 10.0000 mg | ORAL_TABLET | Freq: Every evening | ORAL | 1 refills | Status: DC | PRN

## 2019-07-21 ENCOUNTER — Other Ambulatory Visit: Payer: Self-pay | Admitting: Internal Medicine

## 2019-07-21 DIAGNOSIS — I10 Essential (primary) hypertension: Secondary | ICD-10-CM

## 2019-07-21 DIAGNOSIS — E118 Type 2 diabetes mellitus with unspecified complications: Secondary | ICD-10-CM

## 2019-07-21 MED ORDER — NEBIVOLOL HCL 10 MG PO TABS: 10.0000 mg | ORAL_TABLET | Freq: Every day | ORAL | 0 refills | Status: DC

## 2019-07-21 NOTE — Telephone Encounter (Signed)
Medication Refill - Medication: nebivolol (BYSTOLIC) 10 MG tablet    Preferred Pharmacy (with phone number or street name): [id number] - [place], [place] [phone] (Phone) [phone] (Fax)

## 2019-07-25 ENCOUNTER — Other Ambulatory Visit: Payer: Self-pay | Admitting: Internal Medicine

## 2019-07-25 DIAGNOSIS — H401131 Primary open-angle glaucoma, bilateral, mild stage: Secondary | ICD-10-CM | POA: Diagnosis not present

## 2019-07-25 DIAGNOSIS — Z961 Presence of intraocular lens: Secondary | ICD-10-CM | POA: Diagnosis not present

## 2019-07-25 DIAGNOSIS — H35363 Drusen (degenerative) of macula, bilateral: Secondary | ICD-10-CM | POA: Diagnosis not present

## 2019-07-25 DIAGNOSIS — H2013 Chronic iridocyclitis, bilateral: Secondary | ICD-10-CM | POA: Diagnosis not present

## 2019-07-25 DIAGNOSIS — I10 Essential (primary) hypertension: Secondary | ICD-10-CM

## 2019-07-25 DIAGNOSIS — E118 Type 2 diabetes mellitus with unspecified complications: Secondary | ICD-10-CM

## 2019-07-25 MED ORDER — LOSARTAN POTASSIUM 50 MG PO TABS: 50.0000 mg | ORAL_TABLET | Freq: Every day | ORAL | 1 refills | Status: DC

## 2019-08-01 ENCOUNTER — Other Ambulatory Visit: Payer: Self-pay

## 2019-08-01 ENCOUNTER — Encounter: Payer: Self-pay | Admitting: Internal Medicine

## 2019-08-01 ENCOUNTER — Ambulatory Visit (INDEPENDENT_AMBULATORY_CARE_PROVIDER_SITE_OTHER): Payer: Medicare Other | Admitting: Internal Medicine

## 2019-08-01 VITALS — BP 160/62 | HR 76 | Temp 98.3°F | Resp 16 | Ht 62.0 in | Wt 144.5 lb

## 2019-08-01 DIAGNOSIS — Z23 Encounter for immunization: Secondary | ICD-10-CM | POA: Diagnosis not present

## 2019-08-01 DIAGNOSIS — I10 Essential (primary) hypertension: Secondary | ICD-10-CM

## 2019-08-01 MED ORDER — BYSTOLIC 20 MG PO TABS: 1.0000 | ORAL_TABLET | Freq: Every day | ORAL | 0 refills | Status: DC

## 2019-08-01 NOTE — Progress Notes (Signed)
Subjective:  Patient ID: Sarah [name], female    DOB: [birth year]  Age: 78 y.o. MRN: [id number]  CC: Hypertension   HPI [name] presents for f/up - She is concerned that her blood pressure is not well controlled.  She has not been adhering to a low-carb low-sodium diet.  She denies any recent episodes of headache, blurred vision, chest pain, shortness of breath, palpitations, or edema.  She is followed closely by nephrology for chronic kidney disease.  Outpatient Medications Prior to Visit  Medication Sig Dispense Refill  . albuterol (PROVENTIL HFA;VENTOLIN HFA) 108 (90 Base) MCG/ACT inhaler Inhale 2 puffs into the lungs every 6 (six) hours as needed for wheezing or shortness of breath. 1 Inhaler 3  . atorvastatin (LIPITOR) 10 MG tablet TAKE 1 TABLET(10 MG) BY MOUTH DAILY 90 tablet 1  . Cholecalciferol 2000 units TABS Take 1 tablet (2,000 Units total) by mouth daily. 90 tablet 1  . diltiazem (TIAZAC) 300 MG 24 hr capsule TAKE 1 CAPSULE BY MOUTH DAILY BEFORE BREAKFAST 90 capsule 1  . dorzolamide (TRUSOPT) 2 % ophthalmic solution INSTILL 1 DROP INTO OU BID    . hydrALAZINE (APRESOLINE) 25 MG tablet TK 1 T PO TID    . losartan (COZAAR) 50 MG tablet Take 1 tablet (50 mg total) by mouth daily. 90 tablet 1  . prednisoLONE acetate (PRED FORTE) 1 % ophthalmic suspension SHAKE LQ AND INT 1 GTT IN OS FOUR TIMES DAILY. START AFTER SURGERY    . SYMBICORT 160-4.5 MCG/ACT inhaler INHALE 2 PUFFS INTO THE LUNGS TWICE DAILY 10.2 g 5  . zolpidem (AMBIEN) 10 MG tablet Take 1 tablet (10 mg total) by mouth at bedtime as needed for sleep. 30 tablet 1  . nebivolol (BYSTOLIC) 10 MG tablet Take 1 tablet (10 mg total) by mouth daily. 14 tablet 0  . azelastine (ASTELIN) 0.1 % nasal spray Place 2 sprays into both nostrils 2 (two) times daily. Use in each nostril as directed 30 mL 11  . traMADol (ULTRAM) 50 MG tablet Take 1 tablet (50 mg total) by mouth every 6 (six) hours as needed. 65 tablet 3   No  facility-administered medications prior to visit.     ROS Review of Systems  Constitutional: Negative for diaphoresis, fatigue and unexpected weight change.  HENT: Negative.   Eyes: Negative for visual disturbance.  Respiratory: Negative for cough, chest tightness, shortness of breath and wheezing.   Cardiovascular: Negative for chest pain, palpitations and leg swelling.  Gastrointestinal: Negative for abdominal pain, constipation, diarrhea, nausea and vomiting.  Endocrine: Negative.   Genitourinary: Negative.  Negative for difficulty urinating.  Musculoskeletal: Negative for arthralgias and myalgias.  Skin: Negative.  Negative for color change and pallor.  Neurological: Negative.  Negative for dizziness, weakness, light-headedness and headaches.  Hematological: Negative for adenopathy. Does not bruise/bleed easily.  Psychiatric/Behavioral: Negative.     Objective:  BP (!) 160/62 (BP Location: Left Arm, Patient Position: Sitting, Cuff Size: Normal)   Pulse 76   Temp 98.3 F (36.8 C) (Oral)   Resp 16   Ht 5\' 2"  (1.575 m)   Wt 144 lb 8 oz (65.5 kg)   SpO2 99%   BMI 26.43 kg/m   BP Readings from Last 3 Encounters:  08/01/19 (!) 160/62  01/18/19 (!) 162/72  10/06/18 (!) 160/72    Wt Readings from Last 3 Encounters:  08/01/19 144 lb 8 oz (65.5 kg)  01/18/19 147 lb 4 oz (66.8 kg)  10/06/18 151 lb  4 oz (68.6 kg)    Physical Exam Vitals signs reviewed.  HENT:     Nose: Nose normal.     Mouth/Throat:     Pharynx: Oropharynx is clear.  Eyes:     Conjunctiva/sclera: Conjunctivae normal.  Neck:     Musculoskeletal: Normal range of motion and neck supple. No muscular tenderness.  Cardiovascular:     Rate and Rhythm: Normal rate and regular rhythm.     Heart sounds: No murmur.  Pulmonary:     Breath sounds: No stridor. No wheezing, rhonchi or rales.  Abdominal:     General: Abdomen is protuberant. Bowel sounds are normal. There is no distension.     Palpations: There is  no hepatomegaly, splenomegaly or mass.     Tenderness: There is no guarding.  Musculoskeletal: Normal range of motion.     Right lower leg: No edema.     Left lower leg: No edema.  Skin:    General: Skin is warm and dry.     Coloration: Skin is not pale.  Neurological:     General: No focal deficit present.     Mental Status: She is alert.  Psychiatric:        Mood and Affect: Mood normal.     Lab Results  Component Value Date   WBC 5.3 01/18/2019   HGB 11.6 (L) 01/18/2019   HCT 35.1 (L) 01/18/2019   PLT 220.0 01/18/2019   GLUCOSE 87 01/18/2019   CHOL 151 09/21/2018   TRIG 53.0 09/21/2018   HDL 57.20 09/21/2018   LDLCALC 83 09/21/2018   ALT 11 09/21/2018   AST 14 09/21/2018   NA 140 01/18/2019   K 4.2 01/18/2019   CL 108 01/18/2019   CREATININE 1.77 (H) 01/18/2019   BUN 36 (H) 01/18/2019   CO2 20 01/18/2019   TSH 1.82 09/21/2018   HGBA1C 5.8 01/18/2019   MICROALBUR 0.8 09/21/2018    US Renal  Result Date: 11/11/2018 CLINICAL DATA:  Stage III chronic kidney disease, hypertension, diabetes mellitus EXAM: RENAL / URINARY TRACT ULTRASOUND COMPLETE COMPARISON:  None FINDINGS: Right Kidney: Renal measurements: 7.8 x 4.0 x 4.0 cm = volume: 65.8 mL. Cortical thinning. Increased cortical echogenicity. No mass, hydronephrosis or shadowing calcification Left Kidney: Renal measurements: 8.9 x 4.5 x 4.5 cm = volume: 92.1 mL. Cortical thinning. Increased cortical echogenicity. Simple appearing cyst at LEFT kidney 4.4 x 4.1 x 4.5 cm. No additional mass or hydronephrosis. No shadowing calcification. Bladder: Appears normal for degree of bladder distention. 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 Simple appearing LEFT renal cyst 4.5 cm diameter. Electronically Signed   By: Lavonia Dana M.D.   On: 11/11/2018 09:28    Assessment & Plan:   Alailah was seen today for hypertension.  Diagnoses and all orders for this visit:  Need for influenza  vaccination -     Flu Vaccine QUAD High Dose(Fluad)  Essential hypertension- Her blood pressure is not adequately well controlled.  In addition to lifestyle modifications I have asked her to increase the dose of nebivolol and to stay on the other 2 antihypertensives.  She will see her nephrologist soon to have her renal function and electrolytes rechecked. -     Nebivolol HCl (BYSTOLIC) 20 MG TABS; Take 1 tablet (20 mg total) by mouth daily.   I have discontinued Loney Hering. Morillo's azelastine, traMADol, and nebivolol. I am also having her start on Bystolic. Additionally, I am having her maintain her albuterol,  Cholecalciferol, Symbicort, hydrALAZINE, prednisoLONE acetate, diltiazem, atorvastatin, zolpidem, losartan, and dorzolamide.  Meds ordered this encounter  Medications  . Nebivolol HCl (BYSTOLIC) 20 MG TABS    Sig: Take 1 tablet (20 mg total) by mouth daily.    Dispense:  112 tablet    Refill:  0     Follow-up: Return in about 3 months (around 10/31/2019).  Scarlette Calico, MD

## 2019-08-01 NOTE — Patient Instructions (Signed)

## 2019-08-22 DIAGNOSIS — H401131 Primary open-angle glaucoma, bilateral, mild stage: Secondary | ICD-10-CM | POA: Diagnosis not present

## 2019-08-22 DIAGNOSIS — H2013 Chronic iridocyclitis, bilateral: Secondary | ICD-10-CM | POA: Diagnosis not present

## 2019-08-29 ENCOUNTER — Other Ambulatory Visit: Payer: Self-pay | Admitting: Internal Medicine

## 2019-08-29 DIAGNOSIS — E785 Hyperlipidemia, unspecified: Secondary | ICD-10-CM

## 2019-08-29 MED ORDER — ATORVASTATIN CALCIUM 10 MG PO TABS: ORAL_TABLET | ORAL | 1 refills | Status: DC

## 2019-09-08 ENCOUNTER — Other Ambulatory Visit: Payer: Self-pay | Admitting: Internal Medicine

## 2019-09-08 DIAGNOSIS — F409 Phobic anxiety disorder, unspecified: Secondary | ICD-10-CM

## 2019-09-08 MED ORDER — ZOLPIDEM TARTRATE 10 MG PO TABS: 10.0000 mg | ORAL_TABLET | Freq: Every evening | ORAL | 1 refills | Status: DC | PRN

## 2019-09-20 ENCOUNTER — Other Ambulatory Visit: Payer: Self-pay | Admitting: Emergency Medicine

## 2019-09-20 DIAGNOSIS — I10 Essential (primary) hypertension: Secondary | ICD-10-CM

## 2019-09-20 MED ORDER — BYSTOLIC 20 MG PO TABS: 1.0000 | ORAL_TABLET | Freq: Every day | ORAL | 0 refills | Status: DC

## 2019-09-30 DIAGNOSIS — N1832 Chronic kidney disease, stage 3b: Secondary | ICD-10-CM | POA: Diagnosis not present

## 2019-09-30 DIAGNOSIS — D649 Anemia, unspecified: Secondary | ICD-10-CM | POA: Diagnosis not present

## 2019-09-30 DIAGNOSIS — I129 Hypertensive chronic kidney disease with stage 1 through stage 4 chronic kidney disease, or unspecified chronic kidney disease: Secondary | ICD-10-CM | POA: Diagnosis not present

## 2019-09-30 DIAGNOSIS — E1122 Type 2 diabetes mellitus with diabetic chronic kidney disease: Secondary | ICD-10-CM | POA: Diagnosis not present

## 2019-10-10 ENCOUNTER — Ambulatory Visit (INDEPENDENT_AMBULATORY_CARE_PROVIDER_SITE_OTHER): Payer: Medicare Other | Admitting: Internal Medicine

## 2019-10-10 ENCOUNTER — Other Ambulatory Visit: Payer: Self-pay

## 2019-10-10 ENCOUNTER — Other Ambulatory Visit (INDEPENDENT_AMBULATORY_CARE_PROVIDER_SITE_OTHER): Payer: Medicare Other

## 2019-10-10 ENCOUNTER — Encounter: Payer: Self-pay | Admitting: Internal Medicine

## 2019-10-10 VITALS — BP 148/64 | HR 65 | Temp 98.4°F | Resp 16 | Ht 62.0 in | Wt 143.0 lb

## 2019-10-10 DIAGNOSIS — E790 Hyperuricemia without signs of inflammatory arthritis and tophaceous disease: Secondary | ICD-10-CM | POA: Diagnosis not present

## 2019-10-10 DIAGNOSIS — N183 Chronic kidney disease, stage 3 unspecified: Secondary | ICD-10-CM

## 2019-10-10 DIAGNOSIS — I10 Essential (primary) hypertension: Secondary | ICD-10-CM

## 2019-10-10 DIAGNOSIS — E559 Vitamin D deficiency, unspecified: Secondary | ICD-10-CM

## 2019-10-10 DIAGNOSIS — E785 Hyperlipidemia, unspecified: Secondary | ICD-10-CM

## 2019-10-10 DIAGNOSIS — M722 Plantar fascial fibromatosis: Secondary | ICD-10-CM

## 2019-10-10 DIAGNOSIS — Z23 Encounter for immunization: Secondary | ICD-10-CM

## 2019-10-10 DIAGNOSIS — N2889 Other specified disorders of kidney and ureter: Secondary | ICD-10-CM

## 2019-10-10 DIAGNOSIS — Z0000 Encounter for general adult medical examination without abnormal findings: Secondary | ICD-10-CM

## 2019-10-10 DIAGNOSIS — E118 Type 2 diabetes mellitus with unspecified complications: Secondary | ICD-10-CM

## 2019-10-10 DIAGNOSIS — D638 Anemia in other chronic diseases classified elsewhere: Secondary | ICD-10-CM

## 2019-10-10 LAB — CBC WITH DIFFERENTIAL/PLATELET
Basophils Absolute: 0 10*3/uL (ref 0.0–0.1)
Basophils Relative: 0.6 % (ref 0.0–3.0)
Eosinophils Absolute: 0.3 10*3/uL (ref 0.0–0.7)
Eosinophils Relative: 4.3 % (ref 0.0–5.0)
HCT: 35.5 % — ABNORMAL LOW (ref 36.0–46.0)
Hemoglobin: 11.6 g/dL — ABNORMAL LOW (ref 12.0–15.0)
Lymphocytes Relative: 29.7 % (ref 12.0–46.0)
Lymphs Abs: 1.7 10*3/uL (ref 0.7–4.0)
MCHC: 32.8 g/dL (ref 30.0–36.0)
MCV: 89.9 fl (ref 78.0–100.0)
Monocytes Absolute: 0.4 10*3/uL (ref 0.1–1.0)
Monocytes Relative: 7.2 % (ref 3.0–12.0)
Neutro Abs: 3.4 10*3/uL (ref 1.4–7.7)
Neutrophils Relative %: 58.2 % (ref 43.0–77.0)
Platelets: 253 10*3/uL (ref 150.0–400.0)
RBC: 3.95 Mil/uL (ref 3.87–5.11)
RDW: 14 % (ref 11.5–15.5)
WBC: 5.9 10*3/uL (ref 4.0–10.5)

## 2019-10-10 LAB — URINALYSIS, ROUTINE W REFLEX MICROSCOPIC
Bilirubin Urine: NEGATIVE
Hgb urine dipstick: NEGATIVE
Ketones, ur: NEGATIVE
Leukocytes,Ua: NEGATIVE
Nitrite: NEGATIVE
RBC / HPF: NONE SEEN (ref 0–?)
Specific Gravity, Urine: <=1.005 — AB (ref 1.000–1.030)
Total Protein, Urine: NEGATIVE
Urine Glucose: NEGATIVE
Urobilinogen, UA: 0.2 (ref 0.0–1.0)
WBC, UA: NONE SEEN (ref 0–?)
pH: 5 (ref 5.0–8.0)

## 2019-10-10 LAB — BASIC METABOLIC PANEL
BUN: 41 mg/dL — ABNORMAL HIGH (ref 6–23)
CO2: 20 mEq/L (ref 19–32)
Calcium: 9.6 mg/dL (ref 8.4–10.5)
Chloride: 109 mEq/L (ref 96–112)
Creatinine, Ser: 1.66 mg/dL — ABNORMAL HIGH (ref 0.40–1.20)
GFR: 36.19 mL/min — ABNORMAL LOW (ref >60.00)
Glucose, Bld: 101 mg/dL — ABNORMAL HIGH (ref 70–99)
Potassium: 4.3 mEq/L (ref 3.5–5.1)
Sodium: 139 mEq/L (ref 135–145)

## 2019-10-10 LAB — LIPID PANEL
Cholesterol: 134 mg/dL (ref 0–200)
HDL: 71.6 mg/dL (ref >39.00)
LDL Cholesterol: 55 mg/dL (ref 0–99)
NonHDL: 61.91
Total CHOL/HDL Ratio: 2
Triglycerides: 36 mg/dL (ref 0.0–149.0)
VLDL: 7.2 mg/dL (ref 0.0–40.0)

## 2019-10-10 LAB — HEPATIC FUNCTION PANEL
ALT: 16 U/L (ref 0–35)
AST: 16 U/L (ref 0–37)
Albumin: 4.5 g/dL (ref 3.5–5.2)
Alkaline Phosphatase: 78 U/L (ref 39–117)
Bilirubin, Direct: 0 mg/dL (ref 0.0–0.3)
Total Bilirubin: 0.4 mg/dL (ref 0.2–1.2)
Total Protein: 7.6 g/dL (ref 6.0–8.3)

## 2019-10-10 LAB — HEMOGLOBIN A1C: Hgb A1c MFr Bld: 6 % (ref 4.6–6.5)

## 2019-10-10 LAB — URIC ACID: Uric Acid, Serum: 9.1 mg/dL — ABNORMAL HIGH (ref 2.4–7.0)

## 2019-10-10 LAB — VITAMIN D 25 HYDROXY (VIT D DEFICIENCY, FRACTURES): VITD: 89.19 ng/mL (ref 30.00–100.00)

## 2019-10-10 LAB — TSH: TSH: 2.55 u[IU]/mL (ref 0.35–4.50)

## 2019-10-10 MED ORDER — DILTIAZEM HCL ER BEADS 300 MG PO CP24: ORAL_CAPSULE | ORAL | 1 refills | Status: DC

## 2019-10-10 NOTE — Patient Instructions (Signed)
Health Maintenance, Female Adopting a healthy lifestyle and getting preventive care are important in promoting health and wellness. Ask your health care provider about:  The right schedule for you to have regular tests and exams.  Things you can do on your own to prevent diseases and keep yourself healthy. What should I know about diet, weight, and exercise? Eat a healthy diet   Eat a diet that includes plenty of vegetables, fruits, low-fat dairy products, and lean protein.  Do not eat a lot of foods that are high in solid fats, added sugars, or sodium. Maintain a healthy weight Body mass index (BMI) is used to identify weight problems. It estimates body fat based on height and weight. Your health care provider can help determine your BMI and help you achieve or maintain a healthy weight. Get regular exercise Get regular exercise. This is one of the most important things you can do for your health. Most adults should:  Exercise for at least 150 minutes each week. The exercise should increase your heart rate and make you sweat (moderate-intensity exercise).  Do strengthening exercises at least twice a week. This is in addition to the moderate-intensity exercise.  Spend less time sitting. Even light physical activity can be beneficial. Watch cholesterol and blood lipids Have your blood tested for lipids and cholesterol at 77 years of age, then have this test every 5 years. Have your cholesterol levels checked more often if:  Your lipid or cholesterol levels are high.  You are older than 77 years of age.  You are at high risk for heart disease. What should I know about cancer screening? Depending on your health history and family history, you may need to have cancer screening at various ages. This may include screening for:  Breast cancer.  Cervical cancer.  Colorectal cancer.  Skin cancer.  Lung cancer. What should I know about heart disease, diabetes, and high blood  pressure? Blood pressure and heart disease  High blood pressure causes heart disease and increases the risk of stroke. This is more likely to develop in people who have high blood pressure readings, are of African descent, or are overweight.  Have your blood pressure checked: ? Every 3-5 years if you are 18-39 years of age. ? Every year if you are 40 years old or older. Diabetes Have regular diabetes screenings. This checks your fasting blood sugar level. Have the screening done:  Once every three years after age 40 if you are at a normal weight and have a low risk for diabetes.  More often and at a younger age if you are overweight or have a high risk for diabetes. What should I know about preventing infection? Hepatitis B If you have a higher risk for hepatitis B, you should be screened for this virus. Talk with your health care provider to find out if you are at risk for hepatitis B infection. Hepatitis C Testing is recommended for:  Everyone born from 1945 through 1965.  Anyone with known risk factors for hepatitis C. Sexually transmitted infections (STIs)  Get screened for STIs, including gonorrhea and chlamydia, if: ? You are sexually active and are younger than 77 years of age. ? You are older than 77 years of age and your health care provider tells you that you are at risk for this type of infection. ? Your sexual activity has changed since you were last screened, and you are at increased risk for chlamydia or gonorrhea. Ask your health care provider if   you are at risk.  Ask your health care provider about whether you are at high risk for HIV. Your health care provider may recommend a prescription medicine to help prevent HIV infection. If you choose to take medicine to prevent HIV, you should first get tested for HIV. You should then be tested every 3 months for as long as you are taking the medicine. Pregnancy  If you are about to stop having your period (premenopausal) and  you may become pregnant, seek counseling before you get pregnant.  Take 400 to 800 micrograms (mcg) of folic acid every day if you become pregnant.  Ask for birth control (contraception) if you want to prevent pregnancy. Osteoporosis and menopause Osteoporosis is a disease in which the bones lose minerals and strength with aging. This can result in bone fractures. If you are 65 years old or older, or if you are at risk for osteoporosis and fractures, ask your health care provider if you should:  Be screened for bone loss.  Take a calcium or vitamin D supplement to lower your risk of fractures.  Be given hormone replacement therapy (HRT) to treat symptoms of menopause. Follow these instructions at home: Lifestyle  Do not use any products that contain nicotine or tobacco, such as cigarettes, e-cigarettes, and chewing tobacco. If you need help quitting, ask your health care provider.  Do not use street drugs.  Do not share needles.  Ask your health care provider for help if you need support or information about quitting drugs. Alcohol use  Do not drink alcohol if: ? Your health care provider tells you not to drink. ? You are pregnant, may be pregnant, or are planning to become pregnant.  If you drink alcohol: ? Limit how much you use to 0-1 drink a day. ? Limit intake if you are breastfeeding.  Be aware of how much alcohol is in your drink. In the [place] one drink equals one 12 oz bottle of beer (355 mL), one 5 oz glass of wine (148 mL), or one 1 oz glass of hard liquor (44 mL). General instructions  Schedule regular health, dental, and eye exams.  Stay current with your vaccines.  Tell your health care provider if: ? You often feel depressed. ? You have ever been abused or do not feel safe at home. Summary  Adopting a healthy lifestyle and getting preventive care are important in promoting health and wellness.  Follow your health care provider's instructions about healthy  diet, exercising, and getting tested or screened for diseases.  Follow your health care provider's instructions on monitoring your cholesterol and blood pressure. This information is not intended to replace advice given to you by your health care provider. Make sure you discuss any questions you have with your health care provider. Document Released: 06/02/2011 Document Revised: 11/10/2018 Document Reviewed: 11/10/2018 Elsevier Patient Education  2020 [employer]  

## 2019-10-10 NOTE — Progress Notes (Signed)
Subjective:  Patient ID: Sarah [name], female    DOB: 02/09/1941  Age: 78 y.o. MRN: [id number]  CC: Anemia, Annual Exam, Hypertension, Diabetes, and Hyperlipidemia   HPI JAQUETA KASAI presents for a CPX.  She complains of a several month history of nontraumatic pain in her left heel.  She tells me she recently saw nephrology and her meds have been changed and she is maintaining good blood pressure control.  She denies headache, blurred vision, chest pain, shortness of breath, or edema.  Outpatient Medications Prior to Visit  Medication Sig Dispense Refill  . albuterol (PROVENTIL HFA;VENTOLIN HFA) 108 (90 Base) MCG/ACT inhaler Inhale 2 puffs into the lungs every 6 (six) hours as needed for wheezing or shortness of breath. 1 Inhaler 3  . atorvastatin (LIPITOR) 10 MG tablet TAKE 1 TABLET(10 MG) BY MOUTH DAILY 90 tablet 1  . dorzolamide (TRUSOPT) 2 % ophthalmic solution INSTILL 1 DROP INTO OU BID    . dorzolamide-timolol (COSOPT) 22.3-6.8 MG/ML ophthalmic solution INT 1 DROP IN OU BID    . hydrALAZINE (APRESOLINE) 50 MG tablet Take 50 mg by mouth 3 (three) times daily.    Marland Kitchen losartan (COZAAR) 50 MG tablet Take 1 tablet (50 mg total) by mouth daily. 90 tablet 1  . Nebivolol HCl (BYSTOLIC) 20 MG TABS Take 1 tablet (20 mg total) by mouth daily. -- Office visit needed for further refills 112 tablet 0  . prednisoLONE acetate (PRED FORTE) 1 % ophthalmic suspension SHAKE LQ AND INT 1 GTT IN OS FOUR TIMES DAILY. START AFTER SURGERY    . SYMBICORT 160-4.5 MCG/ACT inhaler INHALE 2 PUFFS INTO THE LUNGS TWICE DAILY 10.2 g 5  . zolpidem (AMBIEN) 10 MG tablet Take 1 tablet (10 mg total) by mouth at bedtime as needed for sleep. 30 tablet 1  . Cholecalciferol 2000 units TABS Take 1 tablet (2,000 Units total) by mouth daily. 90 tablet 1  . diltiazem (TIAZAC) 300 MG 24 hr capsule TAKE 1 CAPSULE BY MOUTH DAILY BEFORE BREAKFAST 90 capsule 1  . hydrALAZINE (APRESOLINE) 25 MG tablet TK 1 T PO TID     No  facility-administered medications prior to visit.     ROS Review of Systems  Constitutional: Negative.  Negative for appetite change, diaphoresis, fatigue and unexpected weight change.  HENT: Negative.   Eyes: Negative.   Respiratory: Negative for cough, chest tightness, shortness of breath and wheezing.   Cardiovascular: Negative for chest pain, palpitations and leg swelling.  Gastrointestinal: Negative for abdominal pain, constipation, diarrhea, nausea and vomiting.  Endocrine: Negative.   Genitourinary: Negative.  Negative for decreased urine volume, difficulty urinating, dysuria, hematuria and urgency.  Musculoskeletal: Positive for arthralgias. Negative for back pain and myalgias.  Skin: Negative.  Negative for color change and pallor.  Neurological: Negative.  Negative for dizziness, weakness, light-headedness and headaches.  Hematological: Negative for adenopathy. Does not bruise/bleed easily.  Psychiatric/Behavioral: Negative.     Objective:  BP (!) 148/64 (BP Location: Left Arm, Patient Position: Sitting, Cuff Size: Large)   Pulse 65   Temp 98.4 F (36.9 C) (Oral)   Resp 16   Ht 5\' 2"  (1.575 m)   Wt 143 lb 0.6 oz (64.9 kg)   SpO2 99%   BMI 26.16 kg/m   BP Readings from Last 3 Encounters:  10/10/19 (!) 148/64  08/01/19 (!) 160/62  01/18/19 (!) 162/72    Wt Readings from Last 3 Encounters:  10/10/19 143 lb 0.6 oz (64.9 kg)  08/01/19 144  lb 8 oz (65.5 kg)  01/18/19 147 lb 4 oz (66.8 kg)    Physical Exam Vitals signs reviewed.  Constitutional:      Appearance: Normal appearance.  HENT:     Nose: Nose normal.     Mouth/Throat:     Mouth: Mucous membranes are moist.  Eyes:     General: No scleral icterus.    Conjunctiva/sclera: Conjunctivae normal.  Neck:     Musculoskeletal: Neck supple.  Cardiovascular:     Rate and Rhythm: Normal rate and regular rhythm.     Heart sounds: No murmur.  Pulmonary:     Effort: Pulmonary effort is normal.     Breath  sounds: No stridor. No wheezing, rhonchi or rales.  Abdominal:     General: Abdomen is protuberant. Bowel sounds are normal. There is no distension.     Palpations: Abdomen is soft. There is no hepatomegaly, splenomegaly or mass.     Tenderness: There is no abdominal tenderness.  Musculoskeletal:     Right lower leg: No edema.     Left lower leg: No edema.     Left foot: Normal range of motion. No tenderness, bony tenderness, swelling or deformity.  Lymphadenopathy:     Cervical: No cervical adenopathy.  Skin:    General: Skin is warm and dry.     Coloration: Skin is not pale.  Neurological:     General: No focal deficit present.     Mental Status: She is alert.  Psychiatric:        Mood and Affect: Mood normal.        Behavior: Behavior normal.     Lab Results  Component Value Date   WBC 5.9 10/10/2019   HGB 11.6 (L) 10/10/2019   HCT 35.5 (L) 10/10/2019   PLT 253.0 10/10/2019   GLUCOSE 101 (H) 10/10/2019   CHOL 134 10/10/2019   TRIG 36.0 10/10/2019   HDL 71.60 10/10/2019   LDLCALC 55 10/10/2019   ALT 16 10/10/2019   AST 16 10/10/2019   NA 139 10/10/2019   K 4.3 10/10/2019   CL 109 10/10/2019   CREATININE 1.66 (H) 10/10/2019   BUN 41 (H) 10/10/2019   CO2 20 10/10/2019   TSH 2.55 10/10/2019   HGBA1C 6.0 10/10/2019   MICROALBUR 0.8 09/21/2018    US Renal  Result Date: 11/11/2018 CLINICAL DATA:  Stage III chronic kidney disease, hypertension, diabetes mellitus EXAM: RENAL / URINARY TRACT ULTRASOUND COMPLETE COMPARISON:  None FINDINGS: Right Kidney: Renal measurements: 7.8 x 4.0 x 4.0 cm = volume: 65.8 mL. Cortical thinning. Increased cortical echogenicity. No mass, hydronephrosis or shadowing calcification Left Kidney: Renal measurements: 8.9 x 4.5 x 4.5 cm = volume: 92.1 mL. Cortical thinning. Increased cortical echogenicity. Simple appearing cyst at LEFT kidney 4.4 x 4.1 x 4.5 cm. No additional mass or hydronephrosis. No shadowing calcification. Bladder: Appears  normal for degree of bladder distention. 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 Simple appearing LEFT renal cyst 4.5 cm diameter. Electronically Signed   By: Lavonia Dana M.D.   On: 11/11/2018 09:28    Assessment & Plan:   Lorie was seen today for anemia, annual exam, hypertension, diabetes and hyperlipidemia.  Diagnoses and all orders for this visit:  Essential hypertension- Her blood pressure is adequately well controlled.  Electrolytes are normal renal function is stable. -     Basic metabolic panel; Future -     TSH; Future -     Urinalysis, Routine  w reflex microscopic; Future -     diltiazem (TIAZAC) 300 MG 24 hr capsule; TAKE 1 CAPSULE BY MOUTH DAILY BEFORE BREAKFAST  Routine health maintenance- Exam completed, labs reviewed, vaccines reviewed and updated, cancer screenings are all up-to-date, patient education was given.  Hyperlipidemia with target LDL less than 100- She has achieved her LDL goal and is doing well on the statin. -     Lipid panel; Future -     Hepatic function panel; Future -     TSH; Future  Vitamin D deficiency disease- Her vitamin D level is in the upper normal limits.  I have asked her to stop taking the vitamin D supplement. 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 -     Uric acid; Future  Type II diabetes mellitus with manifestations ([place])- Her blood sugars are adequately well controlled. -     Basic metabolic panel; Future -     Hemoglobin A1c; Future  Anemia, chronic disease- Her H&H are stable. -     CBC with Differential; Future  Plantar fasciitis, left -     Ambulatory referral to Podiatry -     traMADol (ULTRAM) 50 MG tablet; Take 1 tablet (50 mg total) by mouth every 6 (six) hours as needed. Pain  CRI (chronic renal insufficiency), stage 3 (moderate)- Her renal function is stable.  She will avoid  nephrotoxic agents.  Need for pneumococcal vaccination -     Pneumococcal polysaccharide vaccine 23-valent greater than or equal to 2yo subcutaneous/IM   I have discontinued Loney Hering. Stephen's Cholecalciferol. I have also changed her traMADol. Additionally, I am having her maintain her albuterol, Symbicort, hydrALAZINE, prednisoLONE acetate, losartan, dorzolamide, atorvastatin, zolpidem, Bystolic, hydrALAZINE, dorzolamide-timolol, and diltiazem.  Meds ordered this encounter  Medications  . diltiazem (TIAZAC) 300 MG 24 hr capsule    Sig: TAKE 1 CAPSULE BY MOUTH DAILY BEFORE BREAKFAST    Dispense:  90 capsule    Refill:  1  . traMADol (ULTRAM) 50 MG tablet    Sig: Take 1 tablet (50 mg total) by mouth every 6 (six) hours as needed. Pain    Dispense:  65 tablet    Refill:  3     Follow-up: Return in about 6 months (around 04/08/2020).  Scarlette Calico, MD

## 2019-10-11 ENCOUNTER — Encounter: Payer: Self-pay | Admitting: Internal Medicine

## 2019-10-11 MED ORDER — TRAMADOL HCL 50 MG PO TABS: 50.0000 mg | ORAL_TABLET | Freq: Four times a day (QID) | ORAL | 3 refills | Status: DC | PRN

## 2019-10-17 ENCOUNTER — Ambulatory Visit (INDEPENDENT_AMBULATORY_CARE_PROVIDER_SITE_OTHER): Payer: Medicare Other | Admitting: Podiatry

## 2019-10-17 ENCOUNTER — Other Ambulatory Visit: Payer: Self-pay

## 2019-10-17 ENCOUNTER — Ambulatory Visit (INDEPENDENT_AMBULATORY_CARE_PROVIDER_SITE_OTHER): Payer: Medicare Other

## 2019-10-17 ENCOUNTER — Encounter: Payer: Self-pay | Admitting: Podiatry

## 2019-10-17 VITALS — BP 165/71 | HR 70 | Resp 16

## 2019-10-17 DIAGNOSIS — M722 Plantar fascial fibromatosis: Secondary | ICD-10-CM

## 2019-10-17 NOTE — Progress Notes (Signed)
   Subjective:    Patient ID: Sarah [name], female    DOB: August 14, 1941, 78 y.o.   MRN: [id number]  HPI    Review of Systems  All other systems reviewed and are negative.      Objective:   Physical Exam        Assessment & Plan:

## 2019-10-17 NOTE — Patient Instructions (Signed)

## 2019-10-18 NOTE — Progress Notes (Deleted)
Subjective:   Sarah [name] is a 78 y.o. female who presents for Medicare Annual (Subsequent) preventive examination. I connected with patient by a telephone and verified that I am speaking with the correct person using two identifiers. Patient stated full name and DOB. Patient gave permission to continue with telephonic visit. Patient's location was at home and Nurse's location was at [place] office. Participants during this visit included patient and nurse.; Review of Systems:     Sleep patterns: {SX; SLEEP PATTERNS:18802::"feels rested on waking","does not get up to void","gets up *** times nightly to void","sleeps *** hours nightly"}.    Home Safety/Smoke Alarms: Feels safe in home. Smoke alarms in place.  Living environment; residence and Firearm Safety: {Rehab home environment / accessibility:30080::"no firearms","firearms stored safely"}. Seat Belt Safety/Bike Helmet: Wears seat belt.     Objective:     Vitals: There were no vitals taken for this visit.  There is no height or weight on file to calculate BMI.  Advanced Directives 06/30/2017 06/30/2017 01/07/2013  Does Patient Have a Medical Advance Directive? - No Patient does not have advance directive  Would patient like information on creating a medical advance directive? (No Data) Yes (ED - Information included in AVS) -  Pre-existing out of facility DNR order (yellow form or pink MOST form) - - No    Tobacco Social History   Tobacco Use  Smoking Status Never Smoker  Smokeless Tobacco Never Used     Counseling given: Not Answered  Past Medical History:  Diagnosis Date  . Allergic rhinitis   . Arthritis   . Cataract   . Chronic back pain   . Contact dermatitis and other eczema, due to unspecified cause   . Diabetes mellitus without complication ([place])   . Dysrhythmia    hx of heart beating fast   . History of sinusitis   . Hx of adenomatous colonic polyps 10/17/2014  . Hypertension   . Peripheral vascular disease  (HCC)    hx of DVT - left leg   . Personal history of unspecified circulatory disease   . Plantar fascial fibromatosis   . Shortness of breath    with exertion    Past Surgical History:  Procedure Laterality Date  . ABDOMINAL HYSTERECTOMY    . CATARACT EXTRACTION Bilateral   . FLEXIBLE SIGMOIDOSCOPY    . FOOT SURGERY Bilateral   . History of Lumbar fusion     '87  . SHOULDER ARTHROSCOPY WITH ROTATOR CUFF REPAIR AND SUBACROMIAL DECOMPRESSION Left 01/14/2013   Procedure: Left Shoulder Arthroscopy with Debridement, Subacromial Decompression,rotator cuff repair;  Surgeon: Mcarthur Rossetti, MD;  Location: [facility];  Service: Orthopedics;  Laterality: Left;  Left Shoulder Arthroscopy with Debridement, Subacromial Decompression   Family History  Problem Relation Age of Onset  . Leukemia Mother   . Brain cancer Father        mets  . Throat cancer Brother        x 2 brothers   Social History   Socioeconomic History  . Marital status: Legally Separated    Spouse name: Not on file  . Number of children: 2  . Years of education: Not on file  . Highest education level: Not on file  Occupational History  . Not on file  Social Needs  . Financial resource strain: Not on file  . Food insecurity    Worry: Not on file    Inability: Not on file  . Transportation needs    Medical:  Not on file    Non-medical: Not on file  Tobacco Use  . Smoking status: Never Smoker  . Smokeless tobacco: Never Used  Substance and Sexual Activity  . Alcohol use: No  . Drug use: No  . Sexual activity: Not Currently  Lifestyle  . Physical activity    Days per week: Not on file    Minutes per session: Not on file  . Stress: Not on file  Relationships  . Social Herbalist on phone: Not on file    Gets together: Not on file    Attends religious service: Not on file    Active member of club or organization: Not on file    Attends meetings of clubs or organizations: Not on file     Relationship status: Not on file  Other Topics Concern  . Not on file  Social History Narrative   HSG. Married- '63. 1 daughter '64, 1 son '70,  2 grandchildren. Work: retired. Lives with husband and brother is in the home.    Outpatient Encounter Medications as of 10/19/2019  Medication Sig  . albuterol (PROVENTIL HFA;VENTOLIN HFA) 108 (90 Base) MCG/ACT inhaler Inhale 2 puffs into the lungs every 6 (six) hours as needed for wheezing or shortness of breath.  Marland Kitchen atorvastatin (LIPITOR) 10 MG tablet TAKE 1 TABLET(10 MG) BY MOUTH DAILY  . diltiazem (TIAZAC) 300 MG 24 hr capsule TAKE 1 CAPSULE BY MOUTH DAILY BEFORE BREAKFAST  . dorzolamide (TRUSOPT) 2 % ophthalmic solution INSTILL 1 DROP INTO OU BID  . dorzolamide-timolol (COSOPT) 22.3-6.8 MG/ML ophthalmic solution INT 1 DROP IN OU BID  . hydrALAZINE (APRESOLINE) 50 MG tablet Take 50 mg by mouth 3 (three) times daily.  Marland Kitchen losartan (COZAAR) 50 MG tablet Take 1 tablet (50 mg total) by mouth daily.  . Nebivolol HCl (BYSTOLIC) 20 MG TABS Take 1 tablet (20 mg total) by mouth daily. -- Office visit needed for further refills  . prednisoLONE acetate (PRED FORTE) 1 % ophthalmic suspension SHAKE LQ AND INT 1 GTT IN OS FOUR TIMES DAILY. START AFTER SURGERY  . SYMBICORT 160-4.5 MCG/ACT inhaler INHALE 2 PUFFS INTO THE LUNGS TWICE DAILY  . traMADol (ULTRAM) 50 MG tablet Take 1 tablet (50 mg total) by mouth every 6 (six) hours as needed. Pain  . zolpidem (AMBIEN) 10 MG tablet Take 1 tablet (10 mg total) by mouth at bedtime as needed for sleep.  . [DISCONTINUED] valsartan (DIOVAN) 160 MG tablet Take 1 tablet (160 mg total) by mouth daily.   No facility-administered encounter medications on file as of 10/19/2019.     Activities of Daily Living No flowsheet data found.  Patient Care Team: [name], MD as PCP - General (Internal Medicine)    Assessment:   This is a routine wellness examination for [place]. Physical assessment deferred to PCP.   Exercise Activities and Dietary recommendations   Diet (meal preparation, eat out, water intake, caffeinated beverages, dairy products, fruits and vegetables): {Desc; diets:16563}   Goals    . Stay as healthy as possible to keep blood pressure and blood sugar in parameter     I will continue to follow heart healthy and diabetic diet.        Fall Risk Fall Risk  09/22/2018 09/21/2018 06/30/2017 08/15/2014  Falls in the past year? No No No No   Is the patient's home free of loose throw rugs in walkways, pet beds, electrical cords, etc?   {Blank single:19197::"yes","no"}  Grab bars in the bathroom? {Blank single:19197::"yes","no"}      Handrails on the stairs?   {Blank single:19197::"yes","no"}      Adequate lighting?   {Blank single:19197::"yes","no"}  Depression Screen PHQ 2/9 Scores 09/22/2018 03/23/2018 06/30/2017 08/15/2014  PHQ - 2 Score 0 1 1 0  PHQ- 9 Score - 2 3 -     Cognitive Function MMSE - Mini Mental State Exam 06/30/2017  Orientation to time 5  Orientation to Place 5  Registration 3  Attention/ Calculation 4  Recall 2  Language- name 2 objects 2  Language- repeat 1  Language- follow 3 step command 3  Language- read & follow direction 1  Write a sentence 1  Copy design 1  Total score 28        Immunization History  Administered Date(s) Administered  . Fluad Quad(high Dose 65+) 08/01/2019  . Influenza Split 09/08/2011  . Influenza, High Dose Seasonal PF 10/15/2017, 09/21/2018  . Influenza, Seasonal, Injecte, Preservative Fre 12/08/2012  . Influenza,inj,Quad PF,6+ Mos 08/09/2013, 08/14/2014, 01/24/2016, 07/22/2016  . Pneumococcal Conjugate-13 12/18/2014  . Pneumococcal Polysaccharide-23 08/09/2013, 10/10/2019  . Tdap 05/21/2017  . Tetanus 02/09/2014  . Zoster 02/09/2014   Screening Tests Health Maintenance  Topic Date Due  . FOOT EXAM  09/23/2019  . COLONOSCOPY  10/20/2019  . OPHTHALMOLOGY EXAM  01/21/2020  . HEMOGLOBIN A1C  04/08/2020  .  TETANUS/TDAP  05/22/2027  . INFLUENZA VACCINE  Completed  . DEXA SCAN  Completed  . PNA vac Low Risk Adult  Completed      Plan:     I have personally reviewed and noted the following in the patient's chart:   . Medical and social history . Use of alcohol, tobacco or illicit drugs  . Current medications and supplements . Functional ability and status . Nutritional status . Physical activity . Advanced directives . List of other physicians . Screenings to include cognitive, depression, and falls . Referrals and appointments  In addition, I have reviewed and discussed with patient certain preventive protocols, quality metrics, and best practice recommendations. A written personalized care plan for preventive services as well as general preventive health recommendations were provided to patient.     Michiel Cowboy, RN  10/18/2019

## 2019-10-19 ENCOUNTER — Other Ambulatory Visit: Payer: Self-pay

## 2019-10-19 ENCOUNTER — Ambulatory Visit: Payer: Medicare Other

## 2019-10-19 NOTE — Progress Notes (Signed)
Subjective:   Patient ID: Sarah [name], female   DOB: 78 y.o.   MRN: [id number]   HPI Patient states she has a lot of pain in both her heels with the left being worse than the right and states that this is been going on for several months.  Does not remember specific injury and states that she is tried shoe gear modification and stretching.  Patient does not smoke likes to be active   Review of Systems  All other systems reviewed and are negative.       Objective:  Physical Exam Vitals signs and nursing note reviewed.  Constitutional:      Appearance: She is well-developed.  Pulmonary:     Effort: Pulmonary effort is normal.  Musculoskeletal: Normal range of motion.  Skin:    General: Skin is warm.  Neurological:     Mental Status: She is alert.     Neurovascular status intact muscle strength was found to be adequate range of motion within normal limits.  Patient is noted to have exquisite discomfort plantar fascial left over right with inflammation fluid of the medial band at the insertion of the tendon into the calcaneus.  Moderate depression of the arch noted bilateral with mild equinus condition     Assessment:  Acute plantar fasciitis left over right with probable structural changes     Plan:  H&P x-rays reviewed and today I injected the fascia 3 mg Kenalog 5 mg Xylocaine bilateral and did fascial bracing bilateral with instructions on physical therapy anti-inflammatory support therapy.  Patient will be seen back for [place] to recheck again in the next several weeks and was advised on reduced activities  X-rays indicate spur formation no indications of stress fracture arthritis with moderate depression of the arch

## 2019-10-21 NOTE — Progress Notes (Addendum)
Subjective:   Sarah [name] is a 78 y.o. female who presents for Medicare Annual (Subsequent) preventive examination. I connected with patient by a telephone and verified that I am speaking with the correct person using two identifiers. Patient stated full name and DOB. Patient gave permission to continue with telephonic visit. Patient's location was at home and Nurse's location was at [place] office. Participants during this visit included patient and nurse.  Review of Systems:   Cardiac Risk Factors include: advanced age (>47men, >43 women);diabetes mellitus;dyslipidemia;hypertension Sleep patterns: feels rested on waking, gets up 2-3 times nightly to void and sleeps 7-8 hours nightly.    Home Safety/Smoke Alarms: Feels safe in home. Smoke alarms in place.  Living environment; residence and Firearm Safety: 1-story house/ trailer, equipment: Radio producer, Type: [place]. Lives with daughter , no needs for DME, good support system Seat Belt Safety/Bike Helmet: Wears seat belt.     Objective:     Vitals: There were no vitals taken for this visit.  There is no height or weight on file to calculate BMI.  Advanced Directives 10/24/2019 06/30/2017 06/30/2017 01/07/2013  Does Patient Have a Medical Advance Directive? No - No Patient does not have advance directive  Would patient like information on creating a medical advance directive? No - Patient declined (No Data) Yes (ED - Information included in AVS) -  Pre-existing out of facility DNR order (yellow form or pink MOST form) - - - No    Tobacco Social History   Tobacco Use  Smoking Status Never Smoker  Smokeless Tobacco Never Used     Counseling given: Not Answered  Past Medical History:  Diagnosis Date  . Allergic rhinitis   . Arthritis   . Cataract   . Chronic back pain   . Contact dermatitis and other eczema, due to unspecified cause   . Diabetes mellitus without complication ([place])   . Dysrhythmia    hx of heart beating  fast   . History of sinusitis   . Hx of adenomatous colonic polyps 10/17/2014  . Hypertension   . Peripheral vascular disease (HCC)    hx of DVT - left leg   . Personal history of unspecified circulatory disease   . Plantar fascial fibromatosis   . Shortness of breath    with exertion    Past Surgical History:  Procedure Laterality Date  . ABDOMINAL HYSTERECTOMY    . CATARACT EXTRACTION Bilateral   . FLEXIBLE SIGMOIDOSCOPY    . FOOT SURGERY Bilateral   . History of Lumbar fusion     '87  . SHOULDER ARTHROSCOPY WITH ROTATOR CUFF REPAIR AND SUBACROMIAL DECOMPRESSION Left 01/14/2013   Procedure: Left Shoulder Arthroscopy with Debridement, Subacromial Decompression,rotator cuff repair;  Surgeon: Mcarthur Rossetti, MD;  Location: [facility];  Service: Orthopedics;  Laterality: Left;  Left Shoulder Arthroscopy with Debridement, Subacromial Decompression   Family History  Problem Relation Age of Onset  . Leukemia Mother   . Brain cancer Father        mets  . Throat cancer Brother        x 2 brothers   Social History   Socioeconomic History  . Marital status: Legally Separated    Spouse name: Not on file  . Number of children: 2  . Years of education: Not on file  . Highest education level: Not on file  Occupational History  . Occupation: retired  Scientific laboratory technician  . Financial resource strain: Not hard at all  .  Food insecurity    Worry: Never true    Inability: Never true  . Transportation needs    Medical: No    Non-medical: No  Tobacco Use  . Smoking status: Never Smoker  . Smokeless tobacco: Never Used  Substance and Sexual Activity  . Alcohol use: No  . Drug use: No  . Sexual activity: Not Currently  Lifestyle  . Physical activity    Days per week: 0 days    Minutes per session: 0 min  . Stress: Not at all  Relationships  . Social connections    Talks on phone: More than three times a week    Gets together: More than three times a week    Attends religious  service: More than 4 times per year    Active member of club or organization: Yes    Attends meetings of clubs or organizations: More than 4 times per year    Relationship status: Separated  Other Topics Concern  . Not on file  Social History Narrative   HSG. Married- '63. 1 daughter '64, 1 son '70,  2 grandchildren. Work: retired. Lives with husband and brother is in the home.    Outpatient Encounter Medications as of 10/24/2019  Medication Sig  . albuterol (PROVENTIL HFA;VENTOLIN HFA) 108 (90 Base) MCG/ACT inhaler Inhale 2 puffs into the lungs every 6 (six) hours as needed for wheezing or shortness of breath.  Marland Kitchen atorvastatin (LIPITOR) 10 MG tablet TAKE 1 TABLET(10 MG) BY MOUTH DAILY  . diltiazem (TIAZAC) 300 MG 24 hr capsule TAKE 1 CAPSULE BY MOUTH DAILY BEFORE BREAKFAST  . dorzolamide (TRUSOPT) 2 % ophthalmic solution INSTILL 1 DROP INTO OU BID  . dorzolamide-timolol (COSOPT) 22.3-6.8 MG/ML ophthalmic solution INT 1 DROP IN OU BID  . hydrALAZINE (APRESOLINE) 50 MG tablet Take 50 mg by mouth 3 (three) times daily.  Marland Kitchen losartan (COZAAR) 50 MG tablet Take 1 tablet (50 mg total) by mouth daily.  . Nebivolol HCl (BYSTOLIC) 20 MG TABS Take 1 tablet (20 mg total) by mouth daily. -- Office visit needed for further refills  . SYMBICORT 160-4.5 MCG/ACT inhaler INHALE 2 PUFFS INTO THE LUNGS TWICE DAILY  . traMADol (ULTRAM) 50 MG tablet Take 1 tablet (50 mg total) by mouth every 6 (six) hours as needed. Pain  . zolpidem (AMBIEN) 10 MG tablet Take 1 tablet (10 mg total) by mouth at bedtime as needed for sleep.  . [DISCONTINUED] prednisoLONE acetate (PRED FORTE) 1 % ophthalmic suspension SHAKE LQ AND INT 1 GTT IN OS FOUR TIMES DAILY. START AFTER SURGERY  . [DISCONTINUED] valsartan (DIOVAN) 160 MG tablet Take 1 tablet (160 mg total) by mouth daily.   No facility-administered encounter medications on file as of 10/24/2019.     Activities of Daily Living In your present state of health, do you have  any difficulty performing the following activities: 10/24/2019  Hearing? N  Vision? N  Difficulty concentrating or making decisions? N  Walking or climbing stairs? N  Dressing or bathing? N  Doing errands, shopping? N  Preparing Food and eating ? N  Using the Toilet? N  In the past six months, have you accidently leaked urine? N  Do you have problems with loss of bowel control? N  Managing your Medications? N  Managing your Finances? N  Housekeeping or managing your Housekeeping? N  Some recent data might be hidden    Patient Care Team: [name], MD as PCP - General (Internal Medicine) Ila Mcgill  S, DPM as Consulting Physician (Podiatry) Claudia Desanctis, MD as Consulting Physician (Internal Medicine)    Assessment:   This is a routine wellness examination for [place]. Physical assessment deferred to PCP.   Exercise Activities and Dietary recommendations Current Exercise Habits: The patient does not participate in regular exercise at present, Intensity: Mild, Exercise limited by: None identified  Diet (meal preparation, eat out, water intake, caffeinated beverages, dairy products, fruits and vegetables): in general, a "healthy" diet  , well balanced  Reviewed heart healthy and diabetic diet.    Goals    . Stay as healthy as possible to keep blood pressure and blood sugar in parameter     I will continue to follow heart healthy and diabetic diet.        Fall Risk Fall Risk  10/24/2019 09/22/2018 09/21/2018 06/30/2017 08/15/2014  Falls in the past year? 0 No No No No  Number falls in past yr: 0 - - - -  Injury with Fall? 0 - - - -  Follow up Falls prevention discussed - - - -    Depression Screen PHQ 2/9 Scores 10/24/2019 09/22/2018 03/23/2018 06/30/2017  PHQ - 2 Score 1 0 1 1  PHQ- 9 Score - - 2 3     Cognitive Function MMSE - Mini Mental State Exam 06/30/2017  Orientation to time 5  Orientation to Place 5  Registration 3  Attention/ Calculation 4  Recall  2  Language- name 2 objects 2  Language- repeat 1  Language- follow 3 step command 3  Language- read & follow direction 1  Write a sentence 1  Copy design 1  Total score 28       Ad8 score reviewed for issues:  Issues making decisions: no  Less interest in hobbies / activities: no  Repeats questions, stories (family complaining): no  Trouble using ordinary gadgets (microwave, computer, phone):no  Forgets the month or year: no  Mismanaging finances: no  Remembering appts: no  Daily problems with thinking and/or memory: no Ad8 score is= 0   Immunization History  Administered Date(s) Administered  . Fluad Quad(high Dose 65+) 08/01/2019  . Influenza Split 09/08/2011  . Influenza, High Dose Seasonal PF 10/15/2017, 09/21/2018  . Influenza, Seasonal, Injecte, Preservative Fre 12/08/2012  . Influenza,inj,Quad PF,6+ Mos 08/09/2013, 08/14/2014, 01/24/2016, 07/22/2016  . Pneumococcal Conjugate-13 12/18/2014  . Pneumococcal Polysaccharide-23 08/09/2013, 10/10/2019  . Tdap 05/21/2017  . Tetanus 02/09/2014  . Zoster 02/09/2014   Screening Tests Health Maintenance  Topic Date Due  . FOOT EXAM  09/23/2019  . COLONOSCOPY  10/20/2019  . OPHTHALMOLOGY EXAM  01/21/2020  . HEMOGLOBIN A1C  04/08/2020  . TETANUS/TDAP  05/22/2027  . INFLUENZA VACCINE  Completed  . DEXA SCAN  Completed  . PNA vac Low Risk Adult  Completed      Plan:     Reviewed health maintenance screenings with patient today and relevant education, vaccines, and/or referrals were provided.   Continue to eat heart healthy diet (full of fruits, vegetables, whole grains, lean protein, water--limit salt, fat, and sugar intake) and increase physical activity as tolerated.  Continue doing brain stimulating activities (puzzles, reading, adult coloring books, staying active) to keep memory sharp.   I have personally reviewed and noted the following in the patient's chart:   . Medical and social history . Use of  alcohol, tobacco or illicit drugs  . Current medications and supplements . Functional ability and status . Nutritional status . Physical activity . Advanced directives .  List of other physicians . Screenings to include cognitive, depression, and falls . Referrals and appointments  In addition, I have reviewed and discussed with patient certain preventive protocols, quality metrics, and best practice recommendations. A written personalized care plan for preventive services as well as general preventive health recommendations were provided to patient.     Michiel Cowboy, RN  10/24/2019   Medical screening examination/treatment/procedure(s) were performed by non-physician practitioner and as supervising physician I was immediately available for consultation/collaboration. I agree with above. Scarlette Calico, MD

## 2019-10-23 ENCOUNTER — Encounter: Payer: Self-pay | Admitting: Internal Medicine

## 2019-10-24 ENCOUNTER — Ambulatory Visit (INDEPENDENT_AMBULATORY_CARE_PROVIDER_SITE_OTHER): Payer: Medicare Other | Admitting: *Deleted

## 2019-10-24 DIAGNOSIS — Z0000 Encounter for general adult medical examination without abnormal findings: Secondary | ICD-10-CM

## 2019-11-01 ENCOUNTER — Other Ambulatory Visit: Payer: Self-pay | Admitting: Internal Medicine

## 2019-11-01 DIAGNOSIS — F409 Phobic anxiety disorder, unspecified: Secondary | ICD-10-CM

## 2019-11-01 DIAGNOSIS — F5105 Insomnia due to other mental disorder: Secondary | ICD-10-CM

## 2019-11-01 MED ORDER — ZOLPIDEM TARTRATE 5 MG PO TABS: 5.0000 mg | ORAL_TABLET | Freq: Every evening | ORAL | 2 refills | Status: DC | PRN

## 2019-11-07 ENCOUNTER — Ambulatory Visit: Payer: Medicare Other | Admitting: Podiatry

## 2019-11-07 ENCOUNTER — Encounter: Payer: Self-pay | Admitting: Podiatry

## 2019-11-07 ENCOUNTER — Other Ambulatory Visit: Payer: Self-pay

## 2019-11-07 DIAGNOSIS — H401131 Primary open-angle glaucoma, bilateral, mild stage: Secondary | ICD-10-CM | POA: Diagnosis not present

## 2019-11-07 DIAGNOSIS — Z961 Presence of intraocular lens: Secondary | ICD-10-CM | POA: Diagnosis not present

## 2019-11-07 DIAGNOSIS — H04123 Dry eye syndrome of bilateral lacrimal glands: Secondary | ICD-10-CM | POA: Diagnosis not present

## 2019-11-07 DIAGNOSIS — M722 Plantar fascial fibromatosis: Secondary | ICD-10-CM

## 2019-11-07 DIAGNOSIS — H2013 Chronic iridocyclitis, bilateral: Secondary | ICD-10-CM | POA: Diagnosis not present

## 2019-11-07 NOTE — Progress Notes (Signed)
Subjective:   Patient ID: Sarah [name], female   DOB: 78 y.o.   MRN: [id number]   HPI Patient states the right is doing really well but the left is still hurting in the heel and it improved a little bit with bad when I get up in the morning after periods of sitting   ROS      Objective:  Physical Exam  Neurovascular status intact muscle strength was found to be adequate with discomfort still in the medial band of the left plantar fascia insertion into the calcaneus     Assessment:  Acute plantar fasciitis left with inflammation fluid buildup still noted     Plan:  H&P reviewed condition and I went ahead did sterile prep and injected the fascia 3 mg Kenalog 5 mg Xylocaine and applied fascial brace to lift up the arch.  Gave instructions on physical therapy anti-inflammatories and dispensed night splint with all instructions on usage to use during this.

## 2019-11-14 DIAGNOSIS — N958 Other specified menopausal and perimenopausal disorders: Secondary | ICD-10-CM | POA: Diagnosis not present

## 2019-11-14 DIAGNOSIS — Z6825 Body mass index (BMI) 25.0-25.9, adult: Secondary | ICD-10-CM | POA: Diagnosis not present

## 2019-11-14 DIAGNOSIS — Z01419 Encounter for gynecological examination (general) (routine) without abnormal findings: Secondary | ICD-10-CM | POA: Diagnosis not present

## 2019-11-14 DIAGNOSIS — Z124 Encounter for screening for malignant neoplasm of cervix: Secondary | ICD-10-CM | POA: Diagnosis not present

## 2019-11-14 DIAGNOSIS — M8588 Other specified disorders of bone density and structure, other site: Secondary | ICD-10-CM | POA: Diagnosis not present

## 2019-11-14 LAB — HM DEXA SCAN

## 2019-11-28 ENCOUNTER — Other Ambulatory Visit: Payer: Self-pay

## 2019-11-28 ENCOUNTER — Encounter: Payer: Self-pay | Admitting: Podiatry

## 2019-11-28 ENCOUNTER — Ambulatory Visit: Payer: Medicare Other | Admitting: Podiatry

## 2019-11-28 DIAGNOSIS — M722 Plantar fascial fibromatosis: Secondary | ICD-10-CM

## 2019-11-30 NOTE — Progress Notes (Signed)
Subjective:   Patient ID: Sarah [name], female   DOB: 78 y.o.   MRN: [id number]   HPI Patient states she still has 1 spot on her left foot that is quite sore but she is improved from treatment of heel pain stating 1 area is still aggravating   ROS      Objective:  Physical Exam  Plantar fasciitis left with inflammation fluid still noted around the medial band with improvement but still quite a bit of pain upon palpation     Assessment:  Acute plantar fasciitis left still present     Plan:  H&P reviewed condition and went ahead did sterile prep and then reinjected the fascia 3 mg Kenalog 5 mg Xylocaine and instructed on continued anti-inflammatory supportive shoe gear.  Reappoint for [place] to recheck as needed

## 2019-12-01 ENCOUNTER — Encounter: Payer: Self-pay | Admitting: Internal Medicine

## 2019-12-13 ENCOUNTER — Other Ambulatory Visit: Payer: Self-pay | Admitting: Internal Medicine

## 2019-12-13 DIAGNOSIS — E785 Hyperlipidemia, unspecified: Secondary | ICD-10-CM

## 2019-12-13 MED ORDER — ATORVASTATIN CALCIUM 10 MG PO TABS: ORAL_TABLET | ORAL | 1 refills | Status: DC

## 2019-12-29 DIAGNOSIS — Z1231 Encounter for screening mammogram for malignant neoplasm of breast: Secondary | ICD-10-CM | POA: Diagnosis not present

## 2020-01-01 ENCOUNTER — Other Ambulatory Visit: Payer: Self-pay | Admitting: Internal Medicine

## 2020-01-01 DIAGNOSIS — E118 Type 2 diabetes mellitus with unspecified complications: Secondary | ICD-10-CM

## 2020-01-01 DIAGNOSIS — I10 Essential (primary) hypertension: Secondary | ICD-10-CM

## 2020-01-03 ENCOUNTER — Other Ambulatory Visit: Payer: Self-pay | Admitting: Internal Medicine

## 2020-01-03 DIAGNOSIS — J4540 Moderate persistent asthma, uncomplicated: Secondary | ICD-10-CM

## 2020-01-03 LAB — HM MAMMOGRAPHY

## 2020-01-03 MED ORDER — BUDESONIDE-FORMOTEROL FUMARATE 160-4.5 MCG/ACT IN AERO: 2.0000 | INHALATION_SPRAY | Freq: Two times a day (BID) | RESPIRATORY_TRACT | 1 refills | Status: DC

## 2020-01-29 ENCOUNTER — Other Ambulatory Visit: Payer: Self-pay | Admitting: Internal Medicine

## 2020-01-29 DIAGNOSIS — F409 Phobic anxiety disorder, unspecified: Secondary | ICD-10-CM

## 2020-02-20 IMAGING — MR MR HEAD W/O CM
10 series · 48 of 48 positions shown · non-contrast
Comparison: None.

CLINICAL DATA: Cerebellar ataxia

EXAM:
MRI HEAD WITHOUT CONTRAST
TECHNIQUE: Multiplanar, multiecho pulse sequences of the brain and surrounding
structures were obtained without intravenous contrast.

[Series 2: T1 · sagittal · 5.0mm · 0.45mm/px · 3 of 24 slices shown]
[im 1/24]
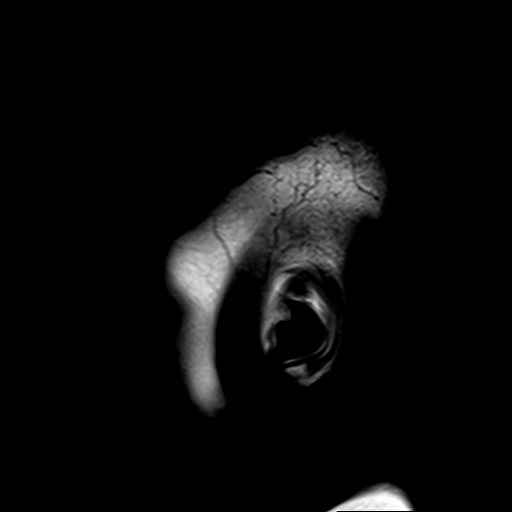
[im 12/24]
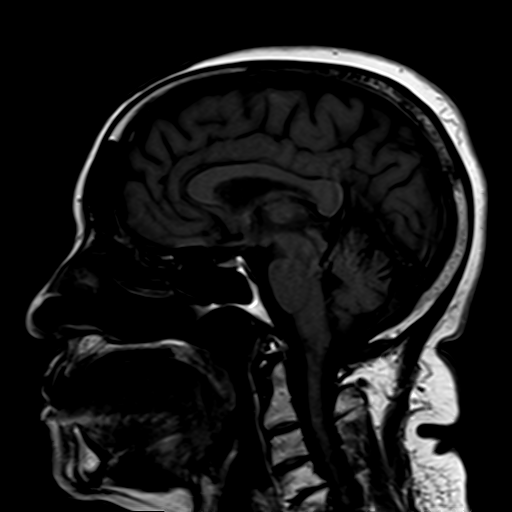
[im 24/24]
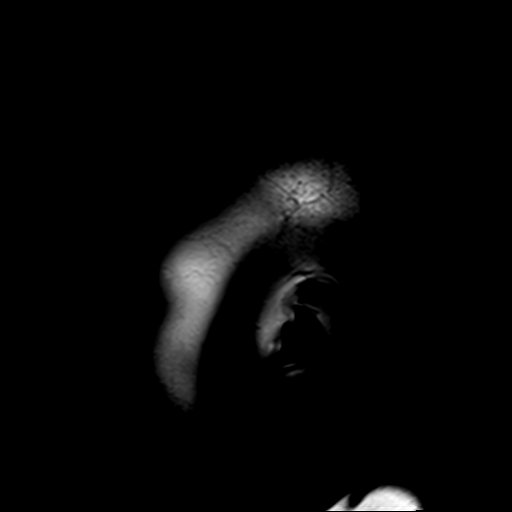

[Series 3: DWI · axial · 3.0mm · 1.80mm/px · z∈[-77,+69]mm · 9 of 100 slices shown (1 of 4)]
[im 1/100]
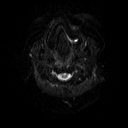
[im 13/100]
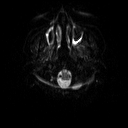
[im 25/100]
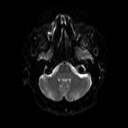
[im 38/100]
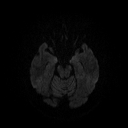
[im 50/100]
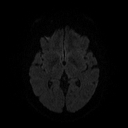
[im 62/100]
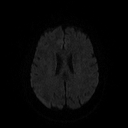
[im 75/100]
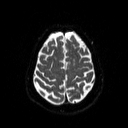
[im 87/100]
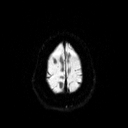
[im 100/100]
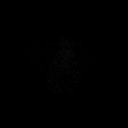

[Series 4: DWI · axial · 3.0mm · 1.80mm/px · z∈[-77,+69]mm · 4 of 48 slices shown (2 of 4)]
[im 1/48]
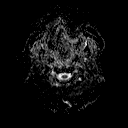
[im 16/48]
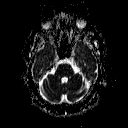
[im 32/48]
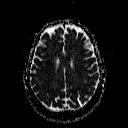
[im 48/48]
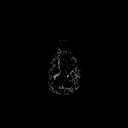

[Series 5: FLAIR · axial · 3.0mm · 0.45mm/px · z∈[-73,+65]mm · 3 of 31 slices shown]
[im 1/31]
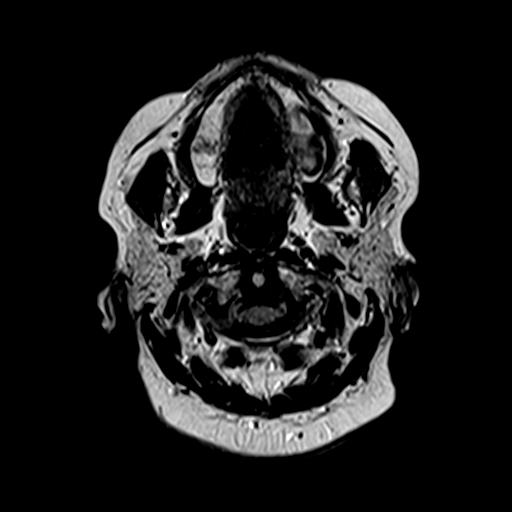
[im 16/31]
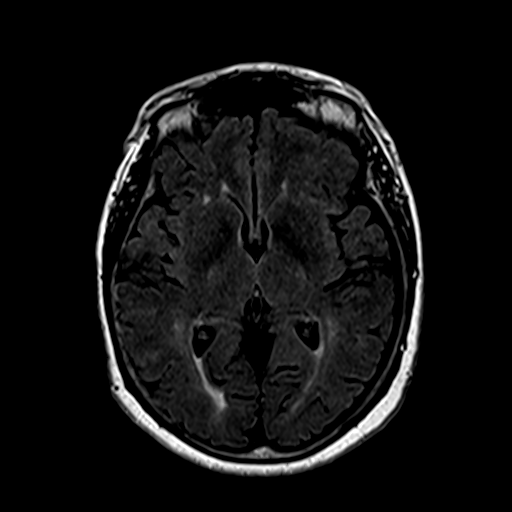
[im 31/31]
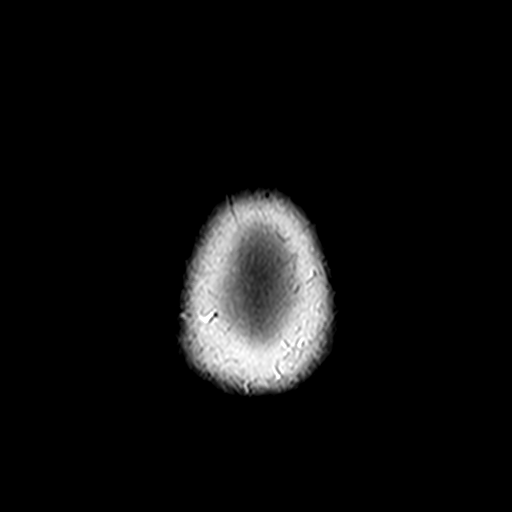

[Series 6: T2 · axial · 5.0mm · 0.60mm/px · z∈[-73,+68]mm · 2 of 22 slices shown (1 of 2)]
[im 1/22]
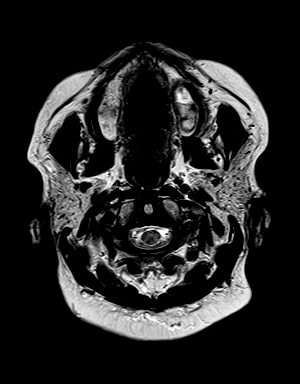
[im 22/22]
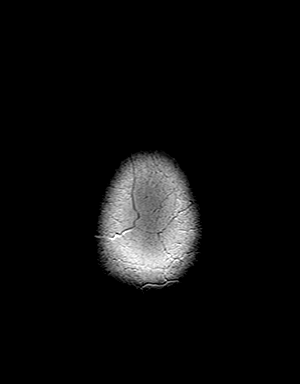

[Series 8: swi_images · axial · 5.0mm · 0.90mm/px · z∈[-68,+76]mm · 3 of 30 slices shown]
[im 1/30]
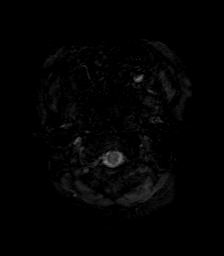
[im 15/30]
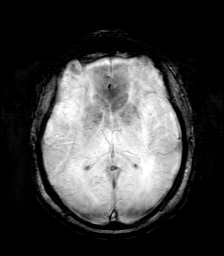
[im 30/30]
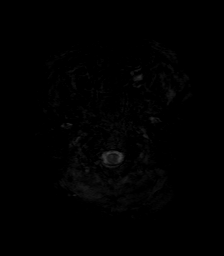

[Series 9: t1_mpr_tra · axial · 1.0mm · 0.72mm/px · z∈[-70,+72]mm · 13 of 144 slices shown]
[im 1/144]
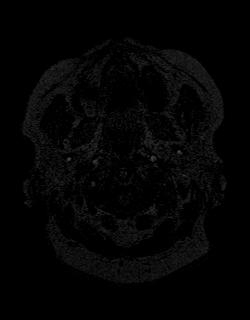
[im 12/144]
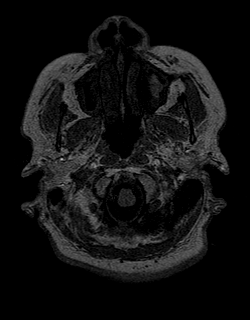
[im 24/144]
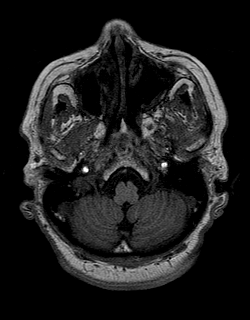
[im 36/144]
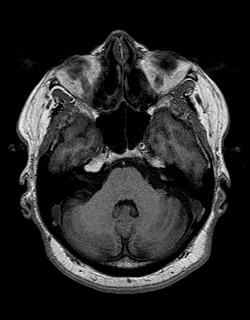
[im 48/144]
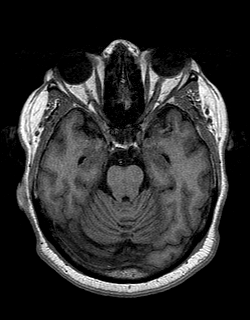
[im 60/144]
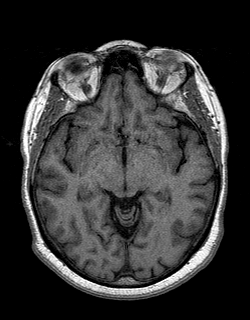
[im 72/144]
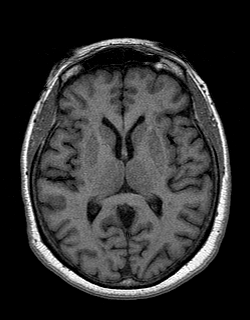
[im 84/144]
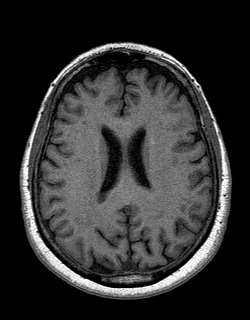
[im 96/144]
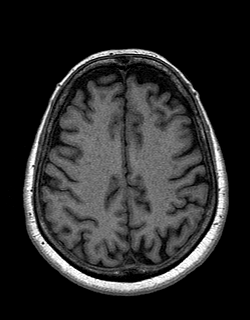
[im 108/144]
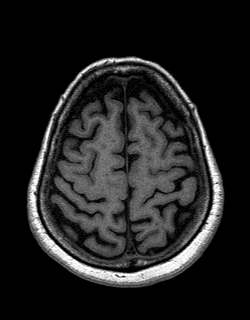
[im 120/144]
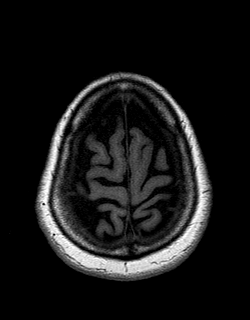
[im 132/144]
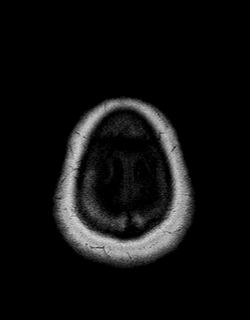
[im 144/144]
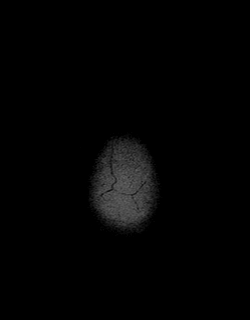

[Series 10: DWI · coronal · 5.0mm · 1.80mm/px · 6 of 70 slices shown (3 of 4)]
[im 1/70]
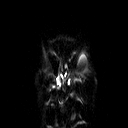
[im 14/70]
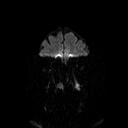
[im 28/70]
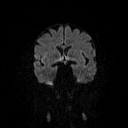
[im 42/70]
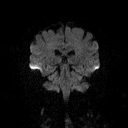
[im 56/70]
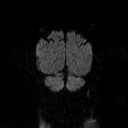
[im 70/70]
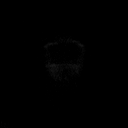

[Series 11: DWI · coronal · 5.0mm · 1.80mm/px · 3 of 35 slices shown (4 of 4)]
[im 1/35]
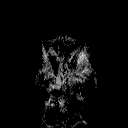
[im 18/35]
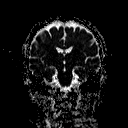
[im 35/35]
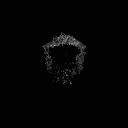

[Series 12: T2 · coronal · 5.0mm · 0.45mm/px · 2 of 27 slices shown (2 of 2)]
[im 1/27]
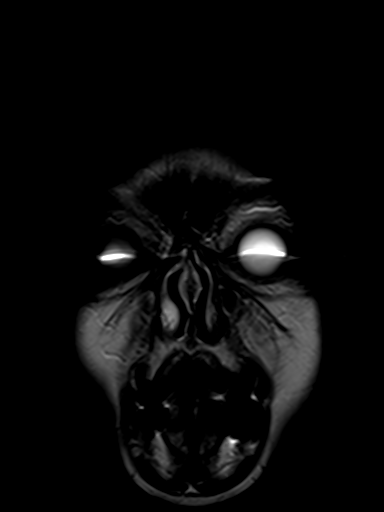
[im 27/27]
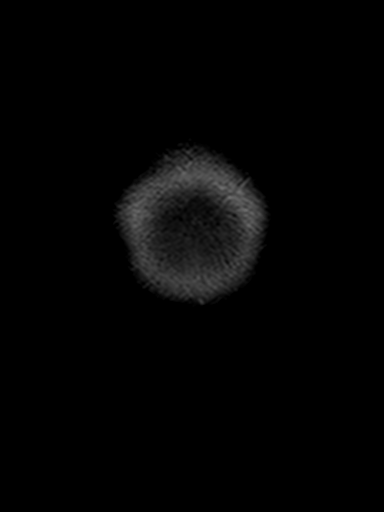

[48 of 48 positions shown; findings below may reference images not displayed]

FINDINGS: Brain: No acute infarction, hemorrhage, hydrocephalus, extra-axial
collection or mass lesion. Mild for age FLAIR hyperintensity in the
cerebral white matter from attributed to chronic small vessel
ischemia. Age normal brain volume.

Vascular: Major flow voids are preserved

Skull and upper cervical spine: No evidence of marrow lesion.

Sinuses/Orbits: Mucosal thickening in the paranasal sinuses.
Bilateral cataract resection. Mastoid and middle ear spaces are
clear.
IMPRESSION: 1. No acute or reversible finding. Unremarkable intracranial imaging
for age.
2. Mild bilateral maxillary sinusitis

## 2020-03-05 ENCOUNTER — Telehealth: Payer: Self-pay | Admitting: Internal Medicine

## 2020-03-05 ENCOUNTER — Other Ambulatory Visit: Payer: Self-pay | Admitting: Internal Medicine

## 2020-03-05 DIAGNOSIS — I10 Essential (primary) hypertension: Secondary | ICD-10-CM

## 2020-03-05 MED ORDER — BYSTOLIC 20 MG PO TABS: 1.0000 | ORAL_TABLET | Freq: Every day | ORAL | 0 refills | Status: DC

## 2020-03-05 NOTE — Telephone Encounter (Signed)
Patient is requesting a RX for the following medication. At her LOV 10/10/19 she was given samples.  Nebivolol HCl (BYSTOLIC) 20 MG TABS   She is due for NOV in May.  Pharmacy on file.

## 2020-03-05 NOTE — Telephone Encounter (Signed)
erx sent as requested.  

## 2020-03-13 DIAGNOSIS — H2013 Chronic iridocyclitis, bilateral: Secondary | ICD-10-CM | POA: Diagnosis not present

## 2020-03-13 DIAGNOSIS — H401131 Primary open-angle glaucoma, bilateral, mild stage: Secondary | ICD-10-CM | POA: Diagnosis not present

## 2020-03-20 DIAGNOSIS — N1832 Chronic kidney disease, stage 3b: Secondary | ICD-10-CM | POA: Diagnosis not present

## 2020-03-23 ENCOUNTER — Telehealth: Payer: Self-pay | Admitting: Internal Medicine

## 2020-03-23 DIAGNOSIS — I10 Essential (primary) hypertension: Secondary | ICD-10-CM

## 2020-03-23 NOTE — Chronic Care Management (AMB) (Signed)
°  Chronic Care Management   Note  03/23/2020 Name: NAFISA LEMMER MRN: [id number] DOB: [birth year]  EMILLIA SCHLOTFELDT is a 79 y.o. year old female who is a primary care patient of [name], MD. I reached out to [name] by phone today in response to a referral sent by Ms. Loney Hering Brockel's PCP, [name], MD.   Ms. Kestner was given information about Chronic Care Management services today including:  1. CCM service includes personalized support from designated clinical staff supervised by her physician, including individualized plan of care and coordination with other care providers 2. 24/7 contact phone numbers for assistance for urgent and routine care needs. 3. Service will only be billed when office clinical staff spend 20 minutes or more in a month to coordinate care. 4. Only one practitioner may furnish and bill the service in a calendar month. 5. The patient may stop CCM services at any time (effective at the end of the month) by phone call to the office staff.   Patient agreed to services and verbal consent obtained.    This note is not being shared with the patient for the following reason: To respect privacy (The patient or proxy has requested that the information not be shared). Follow up plan:   Raynicia Dukes [employer] Scheduler

## 2020-04-01 ENCOUNTER — Other Ambulatory Visit: Payer: Self-pay | Admitting: Internal Medicine

## 2020-04-01 DIAGNOSIS — F5105 Insomnia due to other mental disorder: Secondary | ICD-10-CM

## 2020-04-01 DIAGNOSIS — F409 Phobic anxiety disorder, unspecified: Secondary | ICD-10-CM

## 2020-04-10 DIAGNOSIS — N184 Chronic kidney disease, stage 4 (severe): Secondary | ICD-10-CM | POA: Diagnosis not present

## 2020-04-10 DIAGNOSIS — I129 Hypertensive chronic kidney disease with stage 1 through stage 4 chronic kidney disease, or unspecified chronic kidney disease: Secondary | ICD-10-CM | POA: Diagnosis not present

## 2020-04-10 DIAGNOSIS — D649 Anemia, unspecified: Secondary | ICD-10-CM | POA: Diagnosis not present

## 2020-04-10 DIAGNOSIS — N281 Cyst of kidney, acquired: Secondary | ICD-10-CM | POA: Diagnosis not present

## 2020-04-17 ENCOUNTER — Other Ambulatory Visit: Payer: Self-pay

## 2020-04-17 ENCOUNTER — Ambulatory Visit: Payer: Medicare Other | Admitting: Pharmacist

## 2020-04-17 DIAGNOSIS — E785 Hyperlipidemia, unspecified: Secondary | ICD-10-CM

## 2020-04-17 DIAGNOSIS — J4540 Moderate persistent asthma, uncomplicated: Secondary | ICD-10-CM

## 2020-04-17 DIAGNOSIS — I10 Essential (primary) hypertension: Secondary | ICD-10-CM

## 2020-04-17 NOTE — Patient Instructions (Addendum)
Visit Information  Thank you for meeting with me to discuss your medications! I look forward to working with you to achieve your health care goals. Below is a summary of what we talked about during the visit:  Goals Addressed            This Visit's Progress   . Pharmacy Care Plan       CARE PLAN ENTRY  Current Barriers:  . Chronic Disease Management support, education, and care coordination needs related to Hypertension, Hyperlipidemia, Asthma, and Prediabetes   Hypertension . Pharmacist Clinical Goal(s): o Over the next 30 days, patient will work with PharmD and providers to achieve BP goal <140/90 . Current regimen:  o Diltiazem [id number] mg qAM o Bystolic 20 mg daily o Hydralazine 100 mg TID . Interventions: o Discussed blood pressure goal and benefits of medications o Pursue patient assistance for Bystolic [employer] is non-preferred brand and Q000111Q days) - If application is denied, may attempt tier exception o Diltiazem is "preferred brand" status and will be $74/90 days through mail order o Discussed itching as side effect may be related to allergies; recommend OTC allergy medication (Allegra, Zyrtec, Claritin or generics) . Patient self care activities - Over the next 30 days, patient will: o Check BP daily, document, and provide at future appointments o Obtain proof of income and out of pocket costs of prescriptions to complete Bystolic patient assistance application o Ensure daily salt intake < 2300 mg/day o Take OTC allergy medication and assess itching  Hyperlipidemia . Pharmacist Clinical Goal(s): o Over the next 30 days, patient will work with PharmD and providers to maintain LDL goal < 70 . Current regimen:  o Atorvastatin 10 mg daily . Interventions: o Discussed benefits of statin including cholesterol lowering and cardiovascular risk reduction . Patient self care activities - Over the next 30 days, patient will: o Continue medication as directed o Continue low  cholesterol diet  Asthma . Pharmacist Clinical Goal(s) o Over the next 30 days, patient will work with PharmD and providers to optimize inhaler therapy . Current regimen:  o Symbicort 2 puffs BID . Interventions: o Discussed asthma symptoms and indications for maintenance inhaler. o Recommend discussing ongoing inhaler therapy with PCP . Patient self care activities - Over the next 30 days, patient will: o Discuss necessity of inhaler therapy with PCP  Medication management . Pharmacist Clinical Goal(s): o Over the next 30 days, patient will work with PharmD and providers to maintain optimal medication adherence . Current pharmacy: [facility] . Interventions o Comprehensive medication review performed. o Switch to [facility] mail order pharmacy for cost savings . Patient self care activities - Over the next 30 days, patient will: o Focus on medication adherence by pill count o Take medications as prescribed o Report any questions or concerns to PharmD and/or provider(s)  Initial goal documentation        Sarah [name] was given information about Chronic Care Management services today including:  1. CCM service includes personalized support from designated clinical staff supervised by her physician, including individualized plan of care and coordination with other care providers 2. 24/7 contact phone numbers for assistance for urgent and routine care needs. 3. Standard insurance, coinsurance, copays and deductibles apply for chronic care management only during months in which we provide at least 20 minutes of these services. Most insurances cover these services at 100%, however patients may be responsible for any copay, coinsurance and/or deductible if applicable. This service may help you avoid  the need for more expensive face-to-face services. 4. Only one practitioner may furnish and bill the service in a calendar month. 5. The patient may stop CCM services at any time (effective  at the end of the month) by phone call to the office staff.  Patient agreed to services and verbal consent obtained.   The patient verbalized understanding of instructions provided today and agreed to receive a mailed copy of patient instruction and/or educational materials. Telephone follow up appointment with pharmacy team member scheduled for: 1 month  Charlene Brooke, PharmD Clinical Pharmacist [facility] [phone]   How to Take Your Blood Pressure Blood pressure is a measurement of how strongly your blood is pressing against the walls of your arteries. Arteries are blood vessels that carry blood from your heart throughout your body. Your health care provider takes your blood pressure at each office visit. You can also take your own blood pressure at home with a blood pressure machine. You may need to take your own blood pressure:  To confirm a diagnosis of high blood pressure (hypertension).  To monitor your blood pressure over time.  To make sure your blood pressure medicine is working. Supplies needed: To take your blood pressure, you will need a blood pressure machine. You can buy a blood pressure machine, or blood pressure monitor, at most drugstores or online. There are several types of home blood pressure monitors. When choosing one, consider the following:  Choose a monitor that has an arm cuff.  Choose a cuff that wraps snugly around your upper arm. You should be able to fit only one finger between your arm and the cuff.  Do not choose a monitor that measures your blood pressure from your wrist or finger. Your health care provider can suggest a reliable monitor that will meet your needs. How to prepare To get the most accurate reading, avoid the following for 30 minutes before you check your blood pressure:  Drinking caffeine.  Drinking alcohol.  Eating.  Smoking.  Exercising. Five minutes before you check your blood  pressure:  Empty your bladder.  Sit quietly without talking in a dining chair, rather than in a soft couch or armchair. How to take your blood pressure To check your blood pressure, follow the instructions in the manual that came with your blood pressure monitor. If you have a digital blood pressure monitor, the instructions may be as follows: 1. Sit up straight. 2. Place your feet on the floor. Do not cross your ankles or legs. 3. Rest your left arm at the level of your heart on a table or desk or on the arm of a chair. 4. Pull up your shirt sleeve. 5. Wrap the blood pressure cuff around the upper part of your left arm, 1 inch (2.5 cm) above your elbow. It is best to wrap the cuff around bare skin. 6. Fit the cuff snugly around your arm. You should be able to place only one finger between the cuff and your arm. 7. Position the cord inside the groove of your elbow. 8. Press the power button. 9. Sit quietly while the cuff inflates and deflates. 10. Read the digital reading on the monitor screen and write it down (record it). 11. Wait 2-3 minutes, then repeat the steps, starting at step 1. What does my blood pressure reading mean? A blood pressure reading consists of a higher number over a lower number. Ideally, your blood pressure should be below 120/80. The first ("top")  number is called the systolic pressure. It is a measure of the pressure in your arteries as your heart beats. The second ("bottom") number is called the diastolic pressure. It is a measure of the pressure in your arteries as the heart relaxes. Blood pressure is classified into four stages. The following are the stages for adults who do not have a short-term serious illness or a chronic condition. Systolic pressure and diastolic pressure are measured in a unit called mm Hg. Normal  Systolic pressure: below 123456.  Diastolic pressure: below 80. Elevated  Systolic pressure: Q000111Q.  Diastolic pressure: below  80. Hypertension stage 1  Systolic pressure: 0000000.  Diastolic pressure: XX123456. Hypertension stage 2  Systolic pressure: XX123456 or above.  Diastolic pressure: 90 or above. You can have prehypertension or hypertension even if only the systolic or only the diastolic number in your reading is higher than normal. Follow these instructions at home:  Check your blood pressure as often as recommended by your health care provider.  Take your monitor to the next appointment with your health care provider to make sure: ? That you are using it correctly. ? That it provides accurate readings.  Be sure you understand what your goal blood pressure numbers are.  Tell your health care provider if you are having any side effects from blood pressure medicine. Contact a health care provider if:  Your blood pressure is consistently high. Get help right away if:  Your systolic blood pressure is higher than 180.  Your diastolic blood pressure is higher than 110. This information is not intended to replace advice given to you by your health care provider. Make sure you discuss any questions you have with your health care provider. Document Revised: 10/30/2017 Document Reviewed: 04/25/2016 Elsevier Patient Education  2020 [employer].

## 2020-04-17 NOTE — Chronic Care Management (AMB) (Signed)
Chronic Care Management Pharmacy  Name: Sarah [name]  MRN: [id number] DOB: [birth year]  Chief Complaint/ HPI  Sarah [name],  79 y.o. , female presents for their Initial CCM visit with the clinical pharmacist In office.  PCP : [name], MD  Their chronic conditions include: HTN, T2DM, HLD, asthma, allergies, GERD, anxiety/insomnia, OAB, CKD3  Office Visits: 10/24/19 Emelia Loron AWV  10/10/19 Dr Ronnald Ramp OV: stop Vitamin D due to level ULN. Refer to nephrologist re: hyperuricemia. No other med changes.  08/01/19 Dr Ronnald Ramp OV: BP elevated, increased Bystolic to 20 mg.   Consult Visit: 11/28/19 Dr Paulla Dolly (podiatry): plantar fasciitis - injected [place]/Xylocaine  Medications: Outpatient Encounter Medications as of 04/17/2020  Medication Sig  . atorvastatin (LIPITOR) 10 MG tablet TAKE 1 TABLET(10 MG) BY MOUTH DAILY  . budesonide-formoterol (SYMBICORT) 160-4.5 MCG/ACT inhaler Inhale 2 puffs into the lungs 2 (two) times daily.  Marland Kitchen diltiazem (TIAZAC) 300 MG 24 hr capsule TAKE 1 CAPSULE BY MOUTH DAILY BEFORE BREAKFAST  . dorzolamide-timolol (COSOPT) 22.3-6.8 MG/ML ophthalmic solution INT 1 DROP IN OU BID  . hydrALAZINE (APRESOLINE) 100 MG tablet Take 100 mg by mouth 3 (three) times daily.   . Nebivolol HCl (BYSTOLIC) 20 MG TABS Take 1 tablet (20 mg total) by mouth daily. -- Office visit needed for further refills  . traMADol (ULTRAM) 50 MG tablet Take 1 tablet (50 mg total) by mouth every 6 (six) hours as needed. Pain  . zolpidem (AMBIEN) 5 MG tablet TAKE 1 TABLET(5 MG) BY MOUTH AT BEDTIME AS NEEDED FOR SLEEP  . albuterol (PROVENTIL HFA;VENTOLIN HFA) 108 (90 Base) MCG/ACT inhaler Inhale 2 puffs into the lungs every 6 (six) hours as needed for wheezing or shortness of breath. (Patient not taking: Reported on 04/17/2020)  . dorzolamide (TRUSOPT) 2 % ophthalmic solution INSTILL 1 DROP INTO OU BID  . losartan (COZAAR) 50 MG tablet TAKE 1 TABLET(50 MG) BY MOUTH DAILY (Patient not taking: Reported  on 04/17/2020)  . [DISCONTINUED] valsartan (DIOVAN) 160 MG tablet Take 1 tablet (160 mg total) by mouth daily.   No facility-administered encounter medications on file as of 04/17/2020.     Current Diagnosis/Assessment: SDOH Interventions     Most Recent Value  SDOH Interventions  SDOH Interventions for the Following Domains  Financial Strain  Financial Strain Interventions  Other (Comment) [PAP for Bystolic, Mail order pharmacy for cost savings]      Goals Addressed            This Visit's Progress   . Pharmacy Care Plan       CARE PLAN ENTRY  Current Barriers:  . Chronic Disease Management support, education, and care coordination needs related to Hypertension, Hyperlipidemia, Asthma, and Prediabetes   Hypertension . Pharmacist Clinical Goal(s): o Over the next 30 days, patient will work with PharmD and providers to achieve BP goal <140/90 . Current regimen:  o Diltiazem [id number] mg qAM o Bystolic 20 mg daily o Hydralazine 100 mg TID . Interventions: o Discussed blood pressure goal and benefits of medications o Pursue patient assistance for Bystolic [employer] is non-preferred brand and Q000111Q days) - If application is denied, may attempt tier exception o Diltiazem is "preferred brand" status and will be $74/90 days through mail order o Discussed itching as side effect may be related to allergies; recommend OTC allergy medication (Allegra, Zyrtec, Claritin or generics) . Patient self care activities - Over the next 30 days, patient will: o Check BP daily, document, and provide at  future appointments o Obtain proof of income and out of pocket costs of prescriptions to complete Bystolic patient assistance application o Ensure daily salt intake < 2300 mg/day o Take OTC allergy medication and assess itching  Hyperlipidemia . Pharmacist Clinical Goal(s): o Over the next 30 days, patient will work with PharmD and providers to maintain LDL goal < 70 . Current regimen:   o Atorvastatin 10 mg daily . Interventions: o Discussed benefits of statin including cholesterol lowering and cardiovascular risk reduction . Patient self care activities - Over the next 30 days, patient will: o Continue medication as directed o Continue low cholesterol diet  Asthma . Pharmacist Clinical Goal(s) o Over the next 30 days, patient will work with PharmD and providers to optimize inhaler therapy . Current regimen:  o Symbicort 2 puffs BID . Interventions: o Discussed asthma symptoms and indications for maintenance inhaler. o Recommend discussing ongoing inhaler therapy with PCP . Patient self care activities - Over the next 30 days, patient will: o Discuss necessity of inhaler therapy with PCP  Medication management . Pharmacist Clinical Goal(s): o Over the next 30 days, patient will work with PharmD and providers to maintain optimal medication adherence . Current pharmacy: [facility] . Interventions o Comprehensive medication review performed. o Switch to [facility] mail order pharmacy for cost savings . Patient self care activities - Over the next 30 days, patient will: o Focus on medication adherence by pill count o Take medications as prescribed o Report any questions or concerns to PharmD and/or provider(s)  Initial goal documentation        Hypertension   Office blood pressures are  BP Readings from Last 3 Encounters:  10/17/19 (!) 165/71  10/10/19 (!) 148/64  08/01/19 (!) 160/62   Kidney Function Lab Results  Component Value Date/Time   CREATININE 1.66 (H) 10/10/2019 02:43 PM   CREATININE 1.77 (H) 01/18/2019 08:43 AM   GFR 36.19 (L) 10/10/2019 02:43 PM   Patient has failed these meds in the past: valsartan, chlorthalidone, HCTZ, metoprolol Patient is currently uncontrolled on the following medications:   Diltiazem XX123456 mg qAM  Bystolic 20 mg daily  Hydralazine 100 mg TID  Patient checks BP at home 1-2x per week  Patient home BP  readings are ranging: pt cannot recall  We discussed diet and exercise extensively; per pt losartan was discontinued by nephrologist last week and hydralazine was increased to 100 mg TID. Pt has not been monitoring BP at home since this change. Discussed benefits of home BP monitoring.  Pt reports recent itching problems, she attributes to hydralazine but pt also has history of severe allergies and does not take an allergy med. Discussed etiology of itching, recommended OTC antihistamine first and if no improvement may consider hydralazine as contributory.   Cost issues: -Bystolic is non-preferred brand > $200/3 months -Diltiazem is >$100/30 months. It is "preferred brand" per insurance and cheapest through mail order for $74/3 months.  Plan  Continue current medications  Pursue PAP for Bystolic Change to mail order pharmacy for Diltiazem cost savings Recommended OTC antihistamine for itching   Prediabetes   Recent Relevant Labs: Lab Results  Component Value Date/Time   HGBA1C 6.0 10/10/2019 02:43 PM   HGBA1C 5.8 01/18/2019 08:43 AM   GFR 36.19 (L) 10/10/2019 02:43 PM   GFR 33.67 (L) 01/18/2019 08:43 AM   MICROALBUR 0.8 09/21/2018 03:14 PM   MICROALBUR 2.0 (H) 05/21/2017 08:52 AM    No medication indicated.  Last diabetic Eye exam:  Lab Results  Component Value Date/Time   HMDIABEYEEXA No Retinopathy 01/20/2019 12:00 AM    Last diabetic Foot exam: No results found for: HMDIABFOOTEX   We discussed: diet and exercise extensively  Plan  Continue control with diet and exercise   Hyperlipidemia   Lipid Panel     Component Value Date/Time   CHOL 134 10/10/2019 1443   TRIG 36.0 10/10/2019 1443   HDL 71.60 10/10/2019 1443   CHOLHDL 2 10/10/2019 1443   VLDL 7.2 10/10/2019 1443   LDLCALC 55 10/10/2019 1443     The 10-year ASCVD risk score Mikey Bussing DC Jr., et al., 2013) is: 40%   Values used to calculate the score:     Age: 62 years     Sex: Female     Is Non-Hispanic  African American: Yes     Diabetic: Yes     Tobacco smoker: No     Systolic Blood Pressure: 123XX123 mmHg     Is BP treated: Yes     HDL Cholesterol: 71.6 mg/dL     Total Cholesterol: 134 mg/dL   Patient has failed these meds in past: n/a Patient is currently controlled on the following medications:   Atorvastatin 10 mg daily  We discussed:  diet and exercise extensively; cholesterol goals and benefits of statin for CV risk reduction  Plan  Continue current medications and control with diet and exercise  Asthma/Allergies   Last spirometry score: n/a  Eosinophil count:   Lab Results  Component Value Date/Time   EOSPCT 4.3 10/10/2019 02:43 PM  %                               Eos (Absolute):  Lab Results  Component Value Date/Time   EOSABS 0.3 10/10/2019 02:43 PM    Tobacco Status:  Social History   Tobacco Use  Smoking Status Never Smoker  Smokeless Tobacco Never Used   Patient has failed these meds in past: n/a Patient is currently controlled on the following medications:   Symbicort 2 puffs BID  Using maintenance inhaler regularly? Yes Frequency of rescue inhaler use:  never  We discussed:  proper inhaler technique; pt is not sure she still needs inhaler, she believes she started inhaler when she was having sinus issues. Pt denies wheezing, shortness of breath, history of asthma attacks. No spirometry on file.  Plan  Continue current medications  Pt will discuss ongoing inhaler therapy with PCP  Insomnia/Anxiety   Patient has failed these meds in past: n/a Patient is currently controlled on the following medications:   Zolpidem 5 mg HS prn  We discussed:  Pt tries not to take every night, but it does help her sleep when she needs it  Plan  Continue current medications   Glaucoma   Patient has failed these meds in past: n/a Patient is currently controlled on the following medications:   Dorzolamide-timolol (Cosopt) - both eyes BID  Prednisolone  eye drops q4h   We discussed:  Patient is satisfied with current regimen and denies issues   Plan  Continue current medications  Pain   Patient has failed these meds in past: n/a Patient is currently controlled on the following medications:   Tramadol 50 mg q6h PRN  Tylenol 325 mg q6h PRN  We discussed:  Pt uses Tylenol for back pain and very rarely takes tramadol  Plan  Continue current medications  Medication Management   Pt uses  [facility] for all medications Uses pill box? No - keeps in vials Pt endorses 100% compliance  We discussed: Pt is able to report exactly when she takes which meds and denies issues with compliance.  Plan  Continue current medication management strategy    Follow up: 1 month phone visit  Charlene Brooke, PharmD Clinical Pharmacist [facility] at [facility] [phone]

## 2020-04-18 NOTE — Addendum Note (Signed)
Addended by: CAIRRIKIER DAVIDSON, Labrenda Lasky M on: 04/18/2020 11:17 AM   Modules accepted: Orders  

## 2020-04-24 ENCOUNTER — Other Ambulatory Visit: Payer: Self-pay

## 2020-04-24 ENCOUNTER — Encounter: Payer: Self-pay | Admitting: Internal Medicine

## 2020-04-24 ENCOUNTER — Ambulatory Visit (INDEPENDENT_AMBULATORY_CARE_PROVIDER_SITE_OTHER): Payer: Medicare Other | Admitting: Internal Medicine

## 2020-04-24 VITALS — BP 162/64 | HR 65 | Temp 97.8°F | Resp 16 | Ht 62.0 in | Wt 142.4 lb

## 2020-04-24 DIAGNOSIS — E118 Type 2 diabetes mellitus with unspecified complications: Secondary | ICD-10-CM

## 2020-04-24 DIAGNOSIS — N183 Chronic kidney disease, stage 3 unspecified: Secondary | ICD-10-CM

## 2020-04-24 DIAGNOSIS — D638 Anemia in other chronic diseases classified elsewhere: Secondary | ICD-10-CM | POA: Diagnosis not present

## 2020-04-24 DIAGNOSIS — I10 Essential (primary) hypertension: Secondary | ICD-10-CM

## 2020-04-24 LAB — CBC WITH DIFFERENTIAL/PLATELET
Basophils Absolute: 0 10*3/uL (ref 0.0–0.1)
Basophils Relative: 0.5 % (ref 0.0–3.0)
Eosinophils Absolute: 0.5 10*3/uL (ref 0.0–0.7)
Eosinophils Relative: 9.6 % — ABNORMAL HIGH (ref 0.0–5.0)
HCT: 33.8 % — ABNORMAL LOW (ref 36.0–46.0)
Hemoglobin: 11.3 g/dL — ABNORMAL LOW (ref 12.0–15.0)
Lymphocytes Relative: 29.3 % (ref 12.0–46.0)
Lymphs Abs: 1.6 10*3/uL (ref 0.7–4.0)
MCHC: 33.3 g/dL (ref 30.0–36.0)
MCV: 91.2 fl (ref 78.0–100.0)
Monocytes Absolute: 0.6 10*3/uL (ref 0.1–1.0)
Monocytes Relative: 11.3 % (ref 3.0–12.0)
Neutro Abs: 2.8 10*3/uL (ref 1.4–7.7)
Neutrophils Relative %: 49.3 % (ref 43.0–77.0)
Platelets: 238 10*3/uL (ref 150.0–400.0)
RBC: 3.7 Mil/uL — ABNORMAL LOW (ref 3.87–5.11)
RDW: 13.1 % (ref 11.5–15.5)
WBC: 5.6 10*3/uL (ref 4.0–10.5)

## 2020-04-24 LAB — BASIC METABOLIC PANEL
BUN: 45 mg/dL — ABNORMAL HIGH (ref 6–23)
CO2: 21 mEq/L (ref 19–32)
Calcium: 9.6 mg/dL (ref 8.4–10.5)
Chloride: 104 mEq/L (ref 96–112)
Creatinine, Ser: 1.63 mg/dL — ABNORMAL HIGH (ref 0.40–1.20)
GFR: 36.9 mL/min — ABNORMAL LOW (ref >60.00)
Glucose, Bld: 109 mg/dL — ABNORMAL HIGH (ref 70–99)
Potassium: 4.1 mEq/L (ref 3.5–5.1)
Sodium: 134 mEq/L — ABNORMAL LOW (ref 135–145)

## 2020-04-24 NOTE — Patient Instructions (Signed)

## 2020-04-24 NOTE — Progress Notes (Signed)
Subjective:  Patient ID: Sarah [name], female    DOB: [birth year]  Age: 79 y.o. MRN: [id number]  CC: Hypertension  This visit occurred during the SARS-CoV-2 public health emergency.  Safety protocols were in place, including screening questions prior to the visit, additional usage of staff PPE, and extensive cleaning of exam room while observing appropriate contact time as indicated for disinfecting solutions.    HPI BARAA LABARR presents for f/up - She has felt well recently and offers no complaints. She is followed closely by nephrology. She is compliant with diltiazem and nebivolol. She feels like her blood pressure has been well controlled. She denies headache, blurred vision, chest pain, shortness of breath, or edema.  Outpatient Medications Prior to Visit  Medication Sig Dispense Refill  . atorvastatin (LIPITOR) 10 MG tablet TAKE 1 TABLET(10 MG) BY MOUTH DAILY 90 tablet 1  . budesonide-formoterol (SYMBICORT) 160-4.5 MCG/ACT inhaler Inhale 2 puffs into the lungs 2 (two) times daily. 3 Inhaler 1  . diltiazem (TIAZAC) 300 MG 24 hr capsule TAKE 1 CAPSULE BY MOUTH DAILY BEFORE BREAKFAST 90 capsule 1  . dorzolamide (TRUSOPT) 2 % ophthalmic solution INSTILL 1 DROP INTO OU BID    . dorzolamide-timolol (COSOPT) 22.3-6.8 MG/ML ophthalmic solution INT 1 DROP IN OU BID    . hydrALAZINE (APRESOLINE) 100 MG tablet Take 100 mg by mouth 3 (three) times daily.     . Nebivolol HCl (BYSTOLIC) 20 MG TABS Take 1 tablet (20 mg total) by mouth daily. -- Office visit needed for further refills 90 tablet 0  . prednisoLONE acetate (PRED MILD) 0.12 % ophthalmic suspension Place 1 drop into both eyes 4 (four) times daily.    Marland Kitchen albuterol (PROVENTIL HFA;VENTOLIN HFA) 108 (90 Base) MCG/ACT inhaler Inhale 2 puffs into the lungs every 6 (six) hours as needed for wheezing or shortness of breath. (Patient not taking: Reported on 04/17/2020) 1 Inhaler 3  . losartan (COZAAR) 50 MG tablet TAKE 1 TABLET(50 MG) BY MOUTH DAILY  (Patient not taking: Reported on 04/17/2020) 90 tablet 1  . traMADol (ULTRAM) 50 MG tablet Take 1 tablet (50 mg total) by mouth every 6 (six) hours as needed. Pain 65 tablet 3  . zolpidem (AMBIEN) 5 MG tablet TAKE 1 TABLET(5 MG) BY MOUTH AT BEDTIME AS NEEDED FOR SLEEP 30 tablet 3   No facility-administered medications prior to visit.    ROS Review of Systems  Constitutional: Negative.  Negative for appetite change, diaphoresis, fatigue and unexpected weight change.  HENT: Negative.   Eyes: Negative for visual disturbance.  Respiratory: Negative for cough, chest tightness, shortness of breath and wheezing.   Cardiovascular: Negative for chest pain, palpitations and leg swelling.  Endocrine: Negative.   Genitourinary: Negative.  Negative for difficulty urinating.  Musculoskeletal: Negative for arthralgias.  Skin: Negative.  Negative for color change, pallor and rash.  Neurological: Negative for dizziness, weakness and light-headedness.  Hematological: Negative for adenopathy. Does not bruise/bleed easily.  Psychiatric/Behavioral: Negative.     Objective:  BP (!) 162/64 (BP Location: Left Arm, Patient Position: Sitting, Cuff Size: Normal)   Pulse 65   Temp 97.8 F (36.6 C) (Oral)   Resp 16   Ht 5\' 2"  (1.575 m)   Wt 142 lb 6 oz (64.6 kg)   SpO2 98%   BMI 26.04 kg/m   BP Readings from Last 3 Encounters:  04/24/20 (!) 162/64  10/17/19 (!) 165/71  10/10/19 (!) 148/64    Wt Readings from Last 3 Encounters:  04/24/20  142 lb 6 oz (64.6 kg)  10/10/19 143 lb 0.6 oz (64.9 kg)  08/01/19 144 lb 8 oz (65.5 kg)    Physical Exam Vitals reviewed.  Constitutional:      Appearance: Normal appearance.  HENT:     Nose: Nose normal.     Mouth/Throat:     Mouth: Mucous membranes are moist.  Eyes:     General: No scleral icterus.    Conjunctiva/sclera: Conjunctivae normal.  Cardiovascular:     Rate and Rhythm: Normal rate and regular rhythm.     Heart sounds: No murmur.  Pulmonary:       Effort: Pulmonary effort is normal.     Breath sounds: No stridor. No wheezing, rhonchi or rales.  Abdominal:     General: Abdomen is flat.     Palpations: There is no mass.     Tenderness: There is no abdominal tenderness. There is no guarding.  Musculoskeletal:        General: Normal range of motion.     Cervical back: Neck supple.     Right lower leg: No edema.     Left lower leg: No edema.  Lymphadenopathy:     Cervical: No cervical adenopathy.  Skin:    General: Skin is warm and dry.  Neurological:     General: No focal deficit present.     Mental Status: She is alert.  Psychiatric:        Mood and Affect: Mood normal.        Behavior: Behavior normal.     Lab Results  Component Value Date   WBC 5.6 04/24/2020   HGB 11.3 (L) 04/24/2020   HCT 33.8 (L) 04/24/2020   PLT 238.0 04/24/2020   GLUCOSE 109 (H) 04/24/2020   CHOL 134 10/10/2019   TRIG 36.0 10/10/2019   HDL 71.60 10/10/2019   LDLCALC 55 10/10/2019   ALT 16 10/10/2019   AST 16 10/10/2019   NA 134 (L) 04/24/2020   K 4.1 04/24/2020   CL 104 04/24/2020   CREATININE 1.63 (H) 04/24/2020   BUN 45 (H) 04/24/2020   CO2 21 04/24/2020   TSH 2.55 10/10/2019   HGBA1C 5.8 04/24/2020   MICROALBUR 0.8 09/21/2018    US RENAL  Result Date: 11/11/2018 CLINICAL DATA:  Stage III chronic kidney disease, hypertension, diabetes mellitus EXAM: RENAL / URINARY TRACT ULTRASOUND COMPLETE COMPARISON:  None FINDINGS: Right Kidney: Renal measurements: 7.8 x 4.0 x 4.0 cm = volume: 65.8 mL. Cortical thinning. Increased cortical echogenicity. No mass, hydronephrosis or shadowing calcification Left Kidney: Renal measurements: 8.9 x 4.5 x 4.5 cm = volume: 92.1 mL. Cortical thinning. Increased cortical echogenicity. Simple appearing cyst at LEFT kidney 4.4 x 4.1 x 4.5 cm. No additional mass or hydronephrosis. No shadowing calcification. Bladder: Appears normal for degree of bladder distention. IMPRESSION: BILATERAL renal cortical atrophy  and medical renal disease changes. Simple appearing LEFT renal cyst 4.5 cm diameter. Electronically Signed   By: Lavonia Dana M.D.   On: 11/11/2018 09:28    Assessment & Plan:   Tyffani was seen today for hypertension.  Diagnoses and all orders for this visit:  Anemia, chronic disease- Her H&H are stable. -     CBC with Differential/Platelet; Future -     CBC with Differential/Platelet  CRI (chronic renal insufficiency), stage 3 (moderate)- Her renal function is stable. I think her blood pressure is adequately well controlled. She agrees to avoid nephrotoxic agents. -     Basic metabolic panel; Future -  Basic metabolic panel  Type II diabetes mellitus with manifestations ([place])- Her blood sugars are adequately well controlled. -     Basic metabolic panel; Future -     Hemoglobin A1c; Future -     Hemoglobin A1c -     Basic metabolic panel  Essential hypertension- Considering her age and comorbid illnesses her blood pressure is adequately well controlled. -     Basic metabolic panel; Future -     Basic metabolic panel   I have discontinued Loney Hering. Valente's albuterol, traMADol, losartan, and zolpidem. I am also having her maintain her dorzolamide, hydrALAZINE, dorzolamide-timolol, atorvastatin, budesonide-formoterol, diltiazem, Bystolic, and prednisoLONE acetate.  No orders of the defined types were placed in this encounter.    Follow-up: Return in about 6 months (around 10/25/2020).  Scarlette Calico, MD

## 2020-04-25 LAB — HEMOGLOBIN A1C: Hgb A1c MFr Bld: 5.8 % (ref 4.6–6.5)

## 2020-05-15 ENCOUNTER — Other Ambulatory Visit: Payer: Self-pay

## 2020-05-15 ENCOUNTER — Ambulatory Visit: Payer: Medicare Other | Admitting: Pharmacist

## 2020-05-15 DIAGNOSIS — E785 Hyperlipidemia, unspecified: Secondary | ICD-10-CM

## 2020-05-15 DIAGNOSIS — F409 Phobic anxiety disorder, unspecified: Secondary | ICD-10-CM

## 2020-05-15 DIAGNOSIS — I10 Essential (primary) hypertension: Secondary | ICD-10-CM

## 2020-05-15 NOTE — Chronic Care Management (AMB) (Signed)
Chronic Care Management Pharmacy  Name: Sarah [name]  MRN: [id number] DOB: 09/08/1941  Chief Complaint/ HPI  Sarah [name],  79 y.o. , female presents for their Follow-Up CCM visit with the clinical pharmacist via telephone.  PCP : [name], MD  Their chronic conditions include: Hypertension, Hyperlipidemia, Diabetes, GERD, Asthma, Chronic Kidney Disease, Anxiety and Overactive Bladder  Office Visits: 04/24/20 Dr Ronnald Ramp OV: conditions stable, BP controlled at home, no med changes.  10/24/19 Emelia Loron AWV  10/10/19 Dr Ronnald Ramp OV: stop Vitamin D due to level ULN. Refer to nephrologist re: hyperuricemia. No other med changes.  08/01/19 Dr Ronnald Ramp OV: BP elevated, increased Bystolic to 20 mg.   Consult Visit: 11/28/19 Dr Paulla Dolly (podiatry): plantar fasciitis - injected [place]/Xylocaine  Medications: Outpatient Encounter Medications as of 05/15/2020  Medication Sig  . atorvastatin (LIPITOR) 10 MG tablet TAKE 1 TABLET(10 MG) BY MOUTH DAILY  . budesonide-formoterol (SYMBICORT) 160-4.5 MCG/ACT inhaler Inhale 2 puffs into the lungs 2 (two) times daily.  Marland Kitchen diltiazem (TIAZAC) 300 MG 24 hr capsule TAKE 1 CAPSULE BY MOUTH DAILY BEFORE BREAKFAST  . dorzolamide (TRUSOPT) 2 % ophthalmic solution INSTILL 1 DROP INTO OU BID  . dorzolamide-timolol (COSOPT) 22.3-6.8 MG/ML ophthalmic solution INT 1 DROP IN OU BID  . hydrALAZINE (APRESOLINE) 100 MG tablet Take 100 mg by mouth 3 (three) times daily.   . Nebivolol HCl (BYSTOLIC) 20 MG TABS Take 1 tablet (20 mg total) by mouth daily. -- Office visit needed for further refills  . prednisoLONE acetate (PRED MILD) 0.12 % ophthalmic suspension Place 1 drop into both eyes 4 (four) times daily.  Marland Kitchen zolpidem (AMBIEN) 5 MG tablet Take 5 mg by mouth at bedtime as needed for sleep.  . [DISCONTINUED] valsartan (DIOVAN) 160 MG tablet Take 1 tablet (160 mg total) by mouth daily.   No facility-administered encounter medications on file as of 05/15/2020.      Current Diagnosis/Assessment: SDOH Interventions     Most Recent Value  SDOH Interventions  Financial Strain Interventions Other (Comment)  [Bystolic PAP]      Goals Addressed            This Visit's Progress   . Pharmacy Care Plan       CARE PLAN ENTRY  Current Barriers:  . Chronic Disease Management support, education, and care coordination needs related to Hypertension, Hyperlipidemia, and Insomnia   Hypertension BP Readings from Last 3 Encounters:  04/24/20 (!) 162/64  10/17/19 (!) 165/71  10/10/19 (!) 148/64 .  Pharmacist Clinical Goal(s): o Over the next 30 days, patient will work with PharmD and providers to achieve BP goal <140/90 . Current regimen:  o Diltiazem 355 mg qAM o Bystolic 20 mg daily o Hydralazine 100 mg TID . Interventions: o Discussed blood pressure goal and benefits of medications o Pursue patient assistance for Bystolic  - If application is denied, may attempt tier exception o Diltiazem is "preferred brand" status and will be $74/90 days through mail order . Patient self care activities - Over the next 30 days, patient will: o Check BP daily, document, and provide at future appointments o Obtain proof of income and out of pocket costs of prescriptions to complete Bystolic patient assistance application o Ensure daily salt intake < 2300 mg/day  Hyperlipidemia Lab Results  Component Value Date/Time   LDLCALC 55 10/10/2019 02:43 PM .  Pharmacist Clinical Goal(s): o Over the next 30 days, patient will work with PharmD and providers to maintain LDL goal <  70 . Current regimen:  o Atorvastatin 10 mg daily . Interventions: o Discussed benefits of statin including cholesterol lowering and cardiovascular risk reduction . Patient self care activities - Over the next 30 days, patient will: o Continue medication as directed o Continue low cholesterol diet  Insomnia . Pharmacist Clinical Goal(s) o Over the next 30 days, patient will work  with PharmD and providers to optimize therapy . Current regimen:  o Zolpidem 5 mg at bedtime . Interventions: o Discussed safety issues with zolpidem, including oversedation and reaction time issues - patient does not drive and denies oversedation with 10 mg dose o Discuss 10 mg dose with PCP . Patient self care activities - Over the next 30 days, patient will: o Take medications as prescribed  Medication management . Pharmacist Clinical Goal(s): o Over the next 30 days, patient will work with PharmD and providers to maintain optimal medication adherence . Current pharmacy: [facility] . Interventions o Comprehensive medication review performed. o Switch to [facility] mail order pharmacy for cost savings . Patient self care activities - Over the next 30 days, patient will: o Focus on medication adherence by pill count o Take medications as prescribed o Report any questions or concerns to PharmD and/or provider(s)  Please see past updates related to this goal by clicking on the "Past Updates" button in the selected goal         Hypertension   BP goal < 140/90  Office blood pressures are  BP Readings from Last 3 Encounters:  04/24/20 (!) 162/64  10/17/19 (!) 165/71  10/10/19 (!) 148/64   Kidney Function Lab Results  Component Value Date/Time   CREATININE 1.63 (H) 04/24/2020 02:20 PM   CREATININE 1.66 (H) 10/10/2019 02:43 PM   GFR 36.90 (L) 04/24/2020 02:20 PM   GFRNONAA 54 (L) 01/07/2013 10:50 AM   GFRAA 63 (L) 01/07/2013 10:50 AM   Patient has failed these meds in the past: valsartan, chlorthalidone, HCTZ, metoprolol, losartan Patient is currently uncontrolled on the following medications:   Diltiazem 502 mg qAM  Bystolic 20 mg daily  Hydralazine 100 mg TID   Patient checks BP at home 3-5x per week  Patient home BP readings are ranging: 158/61, 154/66,   We discussed diet and exercise extensively; emphasized importance of BP control and hydration for kidney  protection. Discussed Bystolic PAP - application is ready to go but needs patient's income and drug costs. Pt agrees to gather necessary documents.  Cost issues: -Bystolic is non-preferred brand > $200/3 months -Diltiazem is >$100/30 months. It is "preferred brand" per insurance and cheapest through mail order for $74/3 months.  Plan  Continue current medications  Pursue PAP for Bystolic   Prediabetes   D7A goal < 6.5%  Recent Relevant Labs: Lab Results  Component Value Date/Time   HGBA1C 5.8 04/24/2020 02:20 PM   HGBA1C 6.0 10/10/2019 02:43 PM   GFR 36.90 (L) 04/24/2020 02:20 PM   GFR 36.19 (L) 10/10/2019 02:43 PM   MICROALBUR 0.8 09/21/2018 03:14 PM   MICROALBUR 2.0 (H) 05/21/2017 08:52 AM    No medication indicated.  Last diabetic Eye exam:  Lab Results  Component Value Date/Time   HMDIABEYEEXA No Retinopathy 01/20/2019 12:00 AM    Last diabetic Foot exam: No results found for: HMDIABFOOTEX   We discussed: diet and exercise extensively  Plan  Continue control with diet and exercise   Hyperlipidemia   LDL goal < 100  Lipid Panel     Component Value Date/Time  CHOL 134 10/10/2019 1443   TRIG 36.0 10/10/2019 1443   HDL 71.60 10/10/2019 1443   CHOLHDL 2 10/10/2019 1443   VLDL 7.2 10/10/2019 1443   LDLCALC 55 10/10/2019 1443     The 10-year ASCVD risk score Mikey Bussing DC Jr., et al., 2013) is: 39.3%   Values used to calculate the score:     Age: 49 years     Sex: Female     Is Non-Hispanic African American: Yes     Diabetic: Yes     Tobacco smoker: No     Systolic Blood Pressure: 195 mmHg     Is BP treated: Yes     HDL Cholesterol: 71.6 mg/dL     Total Cholesterol: 134 mg/dL   Patient has failed these meds in past: n/a Patient is currently controlled on the following medications:   Atorvastatin 10 mg daily  We discussed:  diet and exercise extensively; cholesterol goals and benefits of statin for CV risk reduction  Plan  Continue current  medications and control with diet and exercise  Asthma/Allergies   Last spirometry score: n/a  Eosinophil count:   Lab Results  Component Value Date/Time   EOSPCT 9.6 (H) 04/24/2020 02:20 PM  %                               Eos (Absolute):  Lab Results  Component Value Date/Time   EOSABS 0.5 04/24/2020 02:20 PM    Tobacco Status:  Social History   Tobacco Use  Smoking Status Never Smoker  Smokeless Tobacco Never Used   Patient has failed these meds in past: n/a Patient is currently controlled on the following medications:   Symbicort 2 puffs BID  Using maintenance inhaler regularly? Yes Frequency of rescue inhaler use:  never  We discussed:  proper inhaler technique  Plan  Continue current medications   Insomnia/Anxiety   Patient has failed these meds in past: n/a Patient is currently controlled on the following medications:   Zolpidem 5 mg HS prn  We discussed:  Pt reports she used to take 10 mg of zolpidem a night, but she thinks this was decreased by podiatry MD in December (per chart review dose was actually decreased by PCP). she reports the lower dose does not help her. Discussed safety/reaction time issues with 10 mg dose, especially in females > 65. Pt reports she previously did well with 10 mg dose, and she does not drive anymore. She requests to return to 10 mg dose, will discuss with PCP.  Plan  Consider return to zolpidem 10 mg dose  Glaucoma   Patient has failed these meds in past: n/a Patient is currently controlled on the following medications:   Dorzolamide-timolol (Cosopt) - both eyes BID  Prednisolone eye drops q4h   We discussed:  Patient is satisfied with current regimen and denies issues   Plan  Continue current medications  Pain   Patient has failed these meds in past: n/a Patient is currently controlled on the following medications:   Tramadol 50 mg q6h PRN  Tylenol 325 mg q6h PRN  We discussed:  Pt uses Tylenol for  back pain and very rarely takes tramadol  Plan  Continue current medications  Medication Management   Pt uses [facility] for all medications Uses pill box? No - keeps in vials Pt endorses 100% compliance  We discussed: Pt is able to report exactly when she takes which meds  and denies issues with compliance.  Plan  Continue current medication management strategy    Follow up: 1 month phone visit  Charlene Brooke, PharmD Clinical Pharmacist [facility] [phone]

## 2020-05-15 NOTE — Patient Instructions (Addendum)
Visit Information  Phone number for Pharmacist: [phone]  Goals Addressed            This Visit's Progress   . Pharmacy Care Plan       CARE PLAN ENTRY  Current Barriers:  . Chronic Disease Management support, education, and care coordination needs related to Hypertension, Hyperlipidemia, and Insomnia   Hypertension BP Readings from Last 3 Encounters:  04/24/20 (!) 162/64  10/17/19 (!) 165/71  10/10/19 (!) 148/64 .  Pharmacist Clinical Goal(s): o Over the next 30 days, patient will work with PharmD and providers to achieve BP goal <140/90 . Current regimen:  o Diltiazem 867 mg qAM o Bystolic 20 mg daily o Hydralazine 100 mg TID . Interventions: o Discussed blood pressure goal and benefits of medications o Pursue patient assistance for Bystolic  - If application is denied, may attempt tier exception o Diltiazem is "preferred brand" status and will be $74/90 days through mail order . Patient self care activities - Over the next 30 days, patient will: o Check BP daily, document, and provide at future appointments o Obtain proof of income and out of pocket costs of prescriptions to complete Bystolic patient assistance application o Ensure daily salt intake < 2300 mg/day  Hyperlipidemia Lab Results  Component Value Date/Time   LDLCALC 55 10/10/2019 02:43 PM .  Pharmacist Clinical Goal(s): o Over the next 30 days, patient will work with PharmD and providers to maintain LDL goal < 70 . Current regimen:  o Atorvastatin 10 mg daily . Interventions: o Discussed benefits of statin including cholesterol lowering and cardiovascular risk reduction . Patient self care activities - Over the next 30 days, patient will: o Continue medication as directed o Continue low cholesterol diet  Insomnia . Pharmacist Clinical Goal(s) o Over the next 30 days, patient will work with PharmD and providers to optimize therapy . Current regimen:  o Zolpidem 5 mg at  bedtime . Interventions: o Discussed safety issues with zolpidem, including oversedation and reaction time issues - patient does not drive and denies oversedation with 10 mg dose o Discuss 10 mg dose with PCP . Patient self care activities - Over the next 30 days, patient will: o Take medications as prescribed  Medication management . Pharmacist Clinical Goal(s): o Over the next 30 days, patient will work with PharmD and providers to maintain optimal medication adherence . Current pharmacy: [facility] . Interventions o Comprehensive medication review performed. o Switch to [facility] mail order pharmacy for cost savings . Patient self care activities - Over the next 30 days, patient will: o Focus on medication adherence by pill count o Take medications as prescribed o Report any questions or concerns to PharmD and/or provider(s)  Please see past updates related to this goal by clicking on the "Past Updates" button in the selected goal        The patient verbalized understanding of instructions provided today and agreed to receive a mailed copy of patient instruction and/or educational materials.  Telephone follow up appointment with pharmacy team member scheduled for: 1 month  Charlene Brooke, PharmD Clinical Pharmacist [facility] [phone]  [place] stands for "Dietary Approaches to Stop Hypertension." The DASH eating plan is a healthy eating plan that has been shown to reduce high blood pressure (hypertension). It may also reduce your risk for type 2 diabetes, heart disease, and stroke. The DASH eating plan may also help with weight loss. What are tips for following this  plan?  General guidelines  Avoid eating more than 2,300 mg (milligrams) of salt (sodium) a day. If you have hypertension, you may need to reduce your sodium intake to 1,500 mg a day.  Limit alcohol intake to no more than 1 drink a day for nonpregnant women and  2 drinks a day for men. One drink equals 12 oz of beer, 5 oz of wine, or 1 oz of hard liquor.  Work with your health care provider to maintain a healthy body weight or to lose weight. Ask what an ideal weight is for you.  Get at least 30 minutes of exercise that causes your heart to beat faster (aerobic exercise) most days of the week. Activities may include walking, swimming, or biking.  Work with your health care provider or diet and nutrition specialist (dietitian) to adjust your eating plan to your individual calorie needs. Reading food labels   Check food labels for the amount of sodium per serving. Choose foods with less than 5 percent of the Daily Value of sodium. Generally, foods with less than 300 mg of sodium per serving fit into this eating plan.  To find whole grains, look for the word "whole" as the first word in the ingredient list. Shopping  Buy products labeled as "low-sodium" or "no salt added."  Buy fresh foods. Avoid canned foods and premade or frozen meals. Cooking  Avoid adding salt when cooking. Use salt-free seasonings or herbs instead of table salt or sea salt. Check with your health care provider or pharmacist before using salt substitutes.  Do not fry foods. Cook foods using healthy methods such as baking, boiling, grilling, and broiling instead.  Cook with heart-healthy oils, such as olive, canola, soybean, or sunflower oil. Meal planning  Eat a balanced diet that includes: ? 5 or more servings of fruits and vegetables each day. At each meal, try to fill half of your plate with fruits and vegetables. ? Up to 6-8 servings of whole grains each day. ? Less than 6 oz of lean meat, poultry, or fish each day. A 3-oz serving of meat is about the same size as a deck of cards. One egg equals 1 oz. ? 2 servings of low-fat dairy each day. ? A serving of nuts, seeds, or beans 5 times each week. ? Heart-healthy fats. Healthy fats called Omega-3 fatty acids are found in  foods such as flaxseeds and coldwater fish, like sardines, salmon, and mackerel.  Limit how much you eat of the following: ? Canned or prepackaged foods. ? Food that is high in trans fat, such as fried foods. ? Food that is high in saturated fat, such as fatty meat. ? Sweets, desserts, sugary drinks, and other foods with added sugar. ? Full-fat dairy products.  Do not salt foods before eating.  Try to eat at least 2 vegetarian meals each week.  Eat more home-cooked food and less restaurant, buffet, and fast food.  When eating at a restaurant, ask that your food be prepared with less salt or no salt, if possible. What foods are recommended? The items listed may not be a complete list. Talk with your dietitian about what dietary choices are best for you. Grains Whole-grain or whole-wheat bread. Whole-grain or whole-wheat pasta. [place] rice. [place]. Bulgur. Whole-grain and low-sodium cereals. Pita bread. Low-fat, low-sodium crackers. Whole-wheat flour tortillas. Vegetables Fresh or frozen vegetables (raw, steamed, roasted, or grilled). Low-sodium or reduced-sodium tomato and vegetable juice. Low-sodium or reduced-sodium tomato sauce and tomato paste. Low-sodium  or reduced-sodium canned vegetables. Fruits All fresh, dried, or frozen fruit. Canned fruit in natural juice (without added sugar). Meat and other protein foods Skinless chicken or [place]. Ground chicken or [place]. Pork with fat trimmed off. Fish and seafood. Egg whites. Dried beans, peas, or lentils. Unsalted nuts, nut butters, and seeds. Unsalted canned beans. Lean cuts of beef with fat trimmed off. Low-sodium, lean deli meat. Dairy Low-fat (1%) or fat-free (skim) milk. Fat-free, low-fat, or reduced-fat cheeses. Nonfat, low-sodium ricotta or cottage cheese. Low-fat or nonfat yogurt. Low-fat, low-sodium cheese. Fats and oils Soft margarine without trans fats. Vegetable oil. Low-fat, reduced-fat, or light mayonnaise and salad  dressings (reduced-sodium). Canola, safflower, olive, soybean, and sunflower oils. Avocado. Seasoning and other foods Herbs. Spices. Seasoning mixes without salt. Unsalted popcorn and pretzels. Fat-free sweets. What foods are not recommended? The items listed may not be a complete list. Talk with your dietitian about what dietary choices are best for you. Grains Baked goods made with fat, such as croissants, muffins, or some breads. Dry pasta or rice meal packs. Vegetables Creamed or fried vegetables. Vegetables in a cheese sauce. Regular canned vegetables (not low-sodium or reduced-sodium). Regular canned tomato sauce and paste (not low-sodium or reduced-sodium). Regular tomato and vegetable juice (not low-sodium or reduced-sodium). Angie Fava. Olives. Fruits Canned fruit in a light or heavy syrup. Fried fruit. Fruit in cream or butter sauce. Meat and other protein foods Fatty cuts of meat. Ribs. Fried meat. Berniece Salines. Sausage. Bologna and other processed lunch meats. Salami. Fatback. Hotdogs. Bratwurst. Salted nuts and seeds. Canned beans with added salt. Canned or smoked fish. Whole eggs or egg yolks. Chicken or [place] with skin. Dairy Whole or 2% milk, cream, and half-and-half. Whole or full-fat cream cheese. Whole-fat or sweetened yogurt. Full-fat cheese. Nondairy creamers. Whipped toppings. Processed cheese and cheese spreads. Fats and oils Butter. Stick margarine. Lard. Shortening. Ghee. Bacon fat. Tropical oils, such as coconut, palm kernel, or palm oil. Seasoning and other foods Salted popcorn and pretzels. Onion salt, garlic salt, seasoned salt, table salt, and sea salt. Worcestershire sauce. Tartar sauce. Barbecue sauce. Teriyaki sauce. Soy sauce, including reduced-sodium. Steak sauce. Canned and packaged gravies. Fish sauce. Oyster sauce. Cocktail sauce. Horseradish that you find on the shelf. Ketchup. Mustard. Meat flavorings and tenderizers. Bouillon cubes. Hot sauce and Tabasco sauce.  Premade or packaged marinades. Premade or packaged taco seasonings. Relishes. Regular salad dressings. Where to find more information:  National Heart, Lung, and [place]: https://wilson-eaton.com/  American Heart Association: www.heart.org Summary  The DASH eating plan is a healthy eating plan that has been shown to reduce high blood pressure (hypertension). It may also reduce your risk for type 2 diabetes, heart disease, and stroke.  With the DASH eating plan, you should limit salt (sodium) intake to 2,300 mg a day. If you have hypertension, you may need to reduce your sodium intake to 1,500 mg a day.  When on the DASH eating plan, aim to eat more fresh fruits and vegetables, whole grains, lean proteins, low-fat dairy, and heart-healthy fats.  Work with your health care provider or diet and nutrition specialist (dietitian) to adjust your eating plan to your individual calorie needs. This information is not intended to replace advice given to you by your health care provider. Make sure you discuss any questions you have with your health care provider. Document Revised: 10/30/2017 Document Reviewed: 11/10/2016 Elsevier Patient Education  2020 [employer].

## 2020-05-30 ENCOUNTER — Other Ambulatory Visit: Payer: Self-pay | Admitting: Internal Medicine

## 2020-05-30 DIAGNOSIS — I10 Essential (primary) hypertension: Secondary | ICD-10-CM

## 2020-06-03 ENCOUNTER — Other Ambulatory Visit: Payer: Self-pay | Admitting: Internal Medicine

## 2020-06-03 DIAGNOSIS — E785 Hyperlipidemia, unspecified: Secondary | ICD-10-CM

## 2020-06-03 DIAGNOSIS — I10 Essential (primary) hypertension: Secondary | ICD-10-CM

## 2020-06-03 DIAGNOSIS — E118 Type 2 diabetes mellitus with unspecified complications: Secondary | ICD-10-CM

## 2020-06-04 ENCOUNTER — Other Ambulatory Visit: Payer: Self-pay | Admitting: Internal Medicine

## 2020-06-04 DIAGNOSIS — E785 Hyperlipidemia, unspecified: Secondary | ICD-10-CM

## 2020-06-04 MED ORDER — ATORVASTATIN CALCIUM 10 MG PO TABS: ORAL_TABLET | ORAL | 1 refills | Status: DC

## 2020-06-14 ENCOUNTER — Other Ambulatory Visit: Payer: Self-pay

## 2020-06-14 ENCOUNTER — Ambulatory Visit: Payer: Medicare Other | Admitting: Pharmacist

## 2020-06-14 DIAGNOSIS — E790 Hyperuricemia without signs of inflammatory arthritis and tophaceous disease: Secondary | ICD-10-CM

## 2020-06-14 DIAGNOSIS — I10 Essential (primary) hypertension: Secondary | ICD-10-CM

## 2020-06-14 DIAGNOSIS — E785 Hyperlipidemia, unspecified: Secondary | ICD-10-CM

## 2020-06-14 NOTE — Chronic Care Management (AMB) (Signed)
Chronic Care Management Pharmacy  Name: Sarah [name]  MRN: [id number] DOB: July 08, 1941  Chief Complaint/ HPI  Sarah [name],  79 y.o. , female presents for their Follow-Up CCM visit with the clinical pharmacist via telephone.  PCP : [name], MD  Patient Care Team: [name], MD as PCP - General (Internal Medicine) Paulla Dolly Tamala Fothergill, DPM as Consulting Physician (Podiatry) Claudia Desanctis, MD as Consulting Physician (Internal Medicine) Charlton Haws, [facility] as Pharmacist (Pharmacist)  Their chronic conditions include: Hypertension, Hyperlipidemia, Diabetes, GERD, Asthma, Chronic Kidney Disease, Anxiety and Overactive Bladder  Office Visits: 04/24/20 Dr Ronnald Ramp OV: conditions stable, BP controlled at home, no med changes.  10/24/19 Emelia Loron AWV  10/10/19 Dr Ronnald Ramp OV: stop Vitamin D due to level ULN. Refer to nephrologist re: hyperuricemia. No other med changes.  08/01/19 Dr Ronnald Ramp OV: BP elevated, increased Bystolic to 20 mg.   Consult Visit: 11/28/19 Dr Paulla Dolly (podiatry): plantar fasciitis - injected Kenalog/Xylocaine  Medications: Outpatient Encounter Medications as of 06/14/2020  Medication Sig  . atorvastatin (LIPITOR) 10 MG tablet TAKE 1 TABLET(10 MG) BY MOUTH DAILY  . budesonide-formoterol (SYMBICORT) 160-4.5 MCG/ACT inhaler Inhale 2 puffs into the lungs 2 (two) times daily.  Marland Kitchen BYSTOLIC 20 MG TABS TAKE 1 TABLET BY MOUTH DAILY  . diltiazem (TIAZAC) 300 MG 24 hr capsule TAKE 1 CAPSULE BY MOUTH DAILY BEFORE BREAKFAST  . dorzolamide (TRUSOPT) 2 % ophthalmic solution INSTILL 1 DROP INTO OU BID  . dorzolamide-timolol (COSOPT) 22.3-6.8 MG/ML ophthalmic solution INT 1 DROP IN OU BID  . hydrALAZINE (APRESOLINE) 100 MG tablet Take 100 mg by mouth 3 (three) times daily.   . prednisoLONE acetate (PRED MILD) 0.12 % ophthalmic suspension Place 1 drop into both eyes 4 (four) times daily.  Marland Kitchen zolpidem (AMBIEN) 5 MG tablet Take 5 mg by mouth at bedtime as needed for sleep.  .  [DISCONTINUED] valsartan (DIOVAN) 160 MG tablet Take 1 tablet (160 mg total) by mouth daily.   No facility-administered encounter medications on file as of 06/14/2020.     Current Diagnosis/Assessment: SDOH Interventions     Most Recent Value  SDOH Interventions  Financial Strain Interventions Other (Comment)  [Bystolic PAP. Consider mail order for cheaper copays]      Goals Addressed            This Visit's Progress   . Pharmacy Care Plan       CARE PLAN ENTRY  Current Barriers:  . Chronic Disease Management support, education, and care coordination needs related to Hypertension, Hyperlipidemia, and Gout   Hypertension BP Readings from Last 3 Encounters:  04/24/20 (!) 162/64  10/17/19 (!) 165/71  10/10/19 (!) 148/64 .  Pharmacist Clinical Goal(s): o Over the next 30 days, patient will work with PharmD and providers to achieve BP goal <140/90 . Current regimen:  o Diltiazem 300 mg daily o Bystolic 20 mg daily o Hydralazine 100 mg 3 times daily . Interventions: o Discussed blood pressure goal and benefits of medications o Pursue patient assistance for Bystolic . Patient self care activities - Over the next 30 days, patient will: o Check BP daily, document, and provide at future appointments o Obtain proof of income and out of pocket costs of prescriptions to complete Bystolic patient assistance application o Ensure daily salt intake < 2300 mg/day  Hyperlipidemia Lab Results  Component Value Date/Time   LDLCALC 55 10/10/2019 02:43 PM .  Pharmacist Clinical Goal(s): o Over the next 30 days, patient will  work with PharmD and providers to maintain LDL goal < 70 . Current regimen:  o Atorvastatin 10 mg daily . Interventions: o Discussed benefits of statin including cholesterol lowering and cardiovascular risk reduction . Patient self care activities - Over the next 30 days, patient will: o Continue medication as directed o Continue low cholesterol  diet  Gout . Pharmacist Clinical Goal(s) o Over the next 30 days, patient will work with PharmD and providers to optimize therapy . Current regimen:  o No medication . Interventions: o Discussed preventative medications for gout - allopurinol works to lower uric acid thereby preventing flares . Patient self care activities - Over the next 30 days, patient will: o Follow up with PCP as scheduled o Low purine diet (see attached)  Medication management . Pharmacist Clinical Goal(s): o Over the next 30 days, patient will work with PharmD and providers to maintain optimal medication adherence . Current pharmacy: [facility] . Interventions o Comprehensive medication review performed. o Switch to [facility] mail order pharmacy for cost savings . Patient self care activities - Over the next 30 days, patient will: o Focus on medication adherence by pill count o Take medications as prescribed o Report any questions or concerns to PharmD and/or provider(s)  Please see past updates related to this goal by clicking on the "Past Updates" button in the selected goal         Hypertension   BP goal < 140/90  Office blood pressures are  BP Readings from Last 3 Encounters:  04/24/20 (!) 162/64  10/17/19 (!) 165/71  10/10/19 (!) 148/64   Kidney Function Lab Results  Component Value Date/Time   CREATININE 1.63 (H) 04/24/2020 02:20 PM   CREATININE 1.66 (H) 10/10/2019 02:43 PM   GFR 36.90 (L) 04/24/2020 02:20 PM   GFRNONAA 54 (L) 01/07/2013 10:50 AM   GFRAA 63 (L) 01/07/2013 10:50 AM   Patient has failed these meds in the past: valsartan, chlorthalidone, HCTZ, metoprolol, losartan Patient is currently uncontrolled on the following medications:   Diltiazem 098 mg qAM  Bystolic 20 mg daily  Hydralazine 100 mg TID   Patient checks BP at home not checking - battery ran out  Patient home BP readings are ranging: n/a  We discussed: Patient has been feeling dizzy, lightheaded on  occasion recently; she has not been able to check BP due to her monitor out of batteries. Discussed low BP can cause dizziness; pt reports adherence to medications as prescribed.   Plan  Continue current medications  Pursue PAP for Bystolic  Hyperlipidemia   LDL goal < 100  Lipid Panel     Component Value Date/Time   CHOL 134 10/10/2019 1443   TRIG 36.0 10/10/2019 1443   HDL 71.60 10/10/2019 1443   CHOLHDL 2 10/10/2019 1443   VLDL 7.2 10/10/2019 1443   LDLCALC 55 10/10/2019 1443     The 10-year ASCVD risk score Mikey Bussing DC Jr., et al., 2013) is: 39.3%   Values used to calculate the score:     Age: 68 years     Sex: Female     Is Non-Hispanic African American: Yes     Diabetic: Yes     Tobacco smoker: No     Systolic Blood Pressure: 119 mmHg     Is BP treated: Yes     HDL Cholesterol: 71.6 mg/dL     Total Cholesterol: 134 mg/dL   Patient has failed these meds in past: n/a Patient is currently controlled on the following  medications:   Atorvastatin 10 mg daily  We discussed:  diet and exercise extensively; cholesterol goals and benefits of statin for CV risk reduction  Plan  Continue current medications and control with diet and exercise  Gout    Ref. Range 09/21/2018 15:14 10/10/2019 14:43  Uric Acid, Serum Latest Ref Range: 2.4 - 7.0 mg/dL 9.7 (H) 9.1 (H)   Patient has failed these meds in past: allopurinol Patient is currently uncontrolled on the following medications:  . No medications  We discussed:  Pt is recovering from severe gout flare; starting 7/5 pt could not get out of bed due to pain in knee and foot. She was treated with prednisone and is much better now but would like to start a med to prevent gout. Pt has f/u appt next week with PCP to discuss this.  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 No - keeps in vials Pt endorses 100% compliance  We discussed: Pt is able to report exactly when she takes which meds and denies issues with compliance.  Plan  Continue current medication management strategy    Follow up: 1 month phone visit  Charlene Brooke, PharmD Clinical Pharmacist [facility] at [facility] [phone]

## 2020-06-14 NOTE — Patient Instructions (Addendum)
Visit Information  Phone number for Pharmacist: [phone]  Goals Addressed            This Visit's Progress   . Pharmacy Care Plan       CARE PLAN ENTRY  Current Barriers:  . Chronic Disease Management support, education, and care coordination needs related to Hypertension, Hyperlipidemia, and Gout   Hypertension BP Readings from Last 3 Encounters:  04/24/20 (!) 162/64  10/17/19 (!) 165/71  10/10/19 (!) 148/64 .  Pharmacist Clinical Goal(s): o Over the next 30 days, patient will work with PharmD and providers to achieve BP goal <140/90 . Current regimen:  o Diltiazem 300 mg daily o Bystolic 20 mg daily o Hydralazine 100 mg 3 times daily . Interventions: o Discussed blood pressure goal and benefits of medications o Pursue patient assistance for Bystolic . Patient self care activities - Over the next 30 days, patient will: o Check BP daily, document, and provide at future appointments o Obtain proof of income and out of pocket costs of prescriptions to complete Bystolic patient assistance application o Ensure daily salt intake < 2300 mg/day  Hyperlipidemia Lab Results  Component Value Date/Time   LDLCALC 55 10/10/2019 02:43 PM .  Pharmacist Clinical Goal(s): o Over the next 30 days, patient will work with PharmD and providers to maintain LDL goal < 70 . Current regimen:  o Atorvastatin 10 mg daily . Interventions: o Discussed benefits of statin including cholesterol lowering and cardiovascular risk reduction . Patient self care activities - Over the next 30 days, patient will: o Continue medication as directed o Continue low cholesterol diet  Gout . Pharmacist Clinical Goal(s) o Over the next 30 days, patient will work with PharmD and providers to optimize therapy . Current regimen:  o No medication . Interventions: o Discussed preventative medications for gout - allopurinol works to lower uric acid thereby preventing flares . Patient self care activities -  Over the next 30 days, patient will: o Follow up with PCP as scheduled o Low purine diet (see attached)  Medication management . Pharmacist Clinical Goal(s): o Over the next 30 days, patient will work with PharmD and providers to maintain optimal medication adherence . Current pharmacy: [facility] . Interventions o Comprehensive medication review performed. o Switch to [facility] mail order pharmacy for cost savings . Patient self care activities - Over the next 30 days, patient will: o Focus on medication adherence by pill count o Take medications as prescribed o Report any questions or concerns to PharmD and/or provider(s)  Please see past updates related to this goal by clicking on the "Past Updates" button in the selected goal        The patient verbalized understanding of instructions provided today and agreed to receive a mailed copy of patient instruction and/or educational materials.  Telephone follow up appointment with pharmacy team member scheduled for: 1 month  Charlene Brooke, PharmD Clinical Pharmacist [facility] A low-purine eating plan involves making food choices to limit your intake of purine. Purine is a kind of uric acid. Too much uric acid in your blood can cause certain conditions, such as gout and kidney stones. Eating a low-purine diet can help control these conditions. What are tips for following this plan? Reading food labels   Avoid foods with saturated or Trans fat.  Check the ingredient list of grains-based foods, such as bread and cereal, to make sure that they contain whole grains.  Check  the ingredient list of sauces or soups to make sure they do not contain meat or fish.  When choosing soft drinks, check the ingredient list to make sure they do not contain high-fructose corn syrup. Shopping  Buy plenty of fresh fruits and vegetables.  Avoid buying canned or fresh  fish.  Buy dairy products labeled as low-fat or nonfat.  Avoid buying premade or processed foods. These foods are often high in fat, salt (sodium), and added sugar. Cooking  Use olive oil instead of butter when cooking. Oils like olive oil, canola oil, and sunflower oil contain healthy fats. Meal planning  Learn which foods do or do not affect you. If you find out that a food tends to cause your gout symptoms to flare up, avoid eating that food. You can enjoy foods that do not cause problems. If you have any questions about a food item, talk with your dietitian or health care provider.  Limit foods high in fat, especially saturated fat. Fat makes it harder for your body to get rid of uric acid.  Choose foods that are lower in fat and are lean sources of protein. General guidelines  Limit alcohol intake to no more than 1 drink a day for nonpregnant women and 2 drinks a day for men. One drink equals 12 oz of beer, 5 oz of wine, or 1 oz of hard liquor. Alcohol can affect the way your body gets rid of uric acid.  Drink plenty of water to keep your urine clear or pale yellow. Fluids can help remove uric acid from your body.  If directed by your health care provider, take a vitamin C supplement.  Work with your health care provider and dietitian to develop a plan to achieve or maintain a healthy weight. Losing weight can help reduce uric acid in your blood. What foods are recommended? The items listed may not be a complete list. Talk with your dietitian about what dietary choices are best for you. Foods low in purines Foods low in purines do not need to be limited. These include:  All fruits.  All low-purine vegetables, pickles, and olives.  Breads, pasta, rice, cornbread, and popcorn. Cake and other baked goods.  All dairy foods.  Eggs, nuts, and nut butters.  Spices and condiments, such as salt, herbs, and vinegar.  Plant oils, butter, and margarine.  Water, sugar-free soft  drinks, tea, coffee, and cocoa.  Vegetable-based soups, broths, sauces, and gravies. Foods moderate in purines Foods moderate in purines should be limited to the amounts listed.   cup of asparagus, cauliflower, spinach, mushrooms, or green peas, each day.  2/3 cup uncooked oatmeal, each day.   cup dry wheat bran or wheat germ, each day.  2-3 ounces of meat or poultry, each day.  4-6 ounces of shellfish, such as crab, lobster, oysters, or shrimp, each day.  1 cup cooked beans, peas, or lentils, each day.  Soup, broths, or bouillon made from meat or fish. Limit these foods as much as possible. What foods are not recommended? The items listed may not be a complete list. Talk with your dietitian about what dietary choices are best for you. Limit your intake of foods high in purines, including:  Beer and other alcohol.  Meat-based gravy or sauce.  Canned or fresh fish, such as: ? Anchovies, sardines, herring, and tuna. ? Mussels and scallops. ? Codfish, trout, and haddock.  Berniece Salines.  Organ meats, such as: ? Liver or kidney. ? Tripe. ? Sweetbreads (thymus  gland or pancreas).  Wild Clinical biochemist.  Yeast or yeast extract supplements.  Drinks sweetened with high-fructose corn syrup. Summary  Eating a low-purine diet can help control conditions caused by too much uric acid in the body, such as gout or kidney stones.  Choose low-purine foods, limit alcohol, and limit foods high in fat.  You will learn over time which foods do or do not affect you. If you find out that a food tends to cause your gout symptoms to flare up, avoid eating that food. This information is not intended to replace advice given to you by your health care provider. Make sure you discuss any questions you have with your health care provider. Document Revised: 10/30/2017 Document Reviewed: 12/31/2016 Elsevier Patient Education  2020 [employer].

## 2020-06-19 ENCOUNTER — Encounter: Payer: Self-pay | Admitting: Internal Medicine

## 2020-06-19 ENCOUNTER — Other Ambulatory Visit: Payer: Self-pay

## 2020-06-19 ENCOUNTER — Ambulatory Visit (INDEPENDENT_AMBULATORY_CARE_PROVIDER_SITE_OTHER): Payer: Medicare Other | Admitting: Internal Medicine

## 2020-06-19 VITALS — BP 138/86 | HR 71 | Temp 98.5°F | Ht 62.0 in | Wt 138.0 lb

## 2020-06-19 DIAGNOSIS — M10371 Gout due to renal impairment, right ankle and foot: Secondary | ICD-10-CM | POA: Diagnosis not present

## 2020-06-19 MED ORDER — COLCHICINE 0.6 MG PO CAPS: 1.0000 | ORAL_CAPSULE | Freq: Every day | ORAL | 0 refills | Status: DC

## 2020-06-19 NOTE — Progress Notes (Signed)
Subjective:  Patient ID: Sarah [name], female    DOB: 01/11/1941  Age: 79 y.o. MRN: [id number]  CC: Gout  This visit occurred during the SARS-CoV-2 public health emergency.  Safety protocols were in place, including screening questions prior to the visit, additional usage of staff PPE, and extensive cleaning of exam room while observing appropriate contact time as indicated for disinfecting solutions.    HPI AVONELL LENIG presents for f/up - She complains of a 1 week history of pain and swelling in her right foot adjacent to the first MTP joint.  She denies any trauma or injury.  She said someone else gave her a course of prednisone and the symptoms have improved but have not totally resolved.  Outpatient Medications Prior to Visit  Medication Sig Dispense Refill  . atorvastatin (LIPITOR) 10 MG tablet TAKE 1 TABLET(10 MG) BY MOUTH DAILY 90 tablet 1  . budesonide-formoterol (SYMBICORT) 160-4.5 MCG/ACT inhaler Inhale 2 puffs into the lungs 2 (two) times daily. 3 Inhaler 1  . BYSTOLIC 20 MG TABS TAKE 1 TABLET BY MOUTH DAILY 90 tablet 0  . diltiazem (TIAZAC) 300 MG 24 hr capsule TAKE 1 CAPSULE BY MOUTH DAILY BEFORE BREAKFAST 90 capsule 1  . dorzolamide (TRUSOPT) 2 % ophthalmic solution INSTILL 1 DROP INTO OU BID    . dorzolamide-timolol (COSOPT) 22.3-6.8 MG/ML ophthalmic solution INT 1 DROP IN OU BID    . hydrALAZINE (APRESOLINE) 100 MG tablet Take 100 mg by mouth 3 (three) times daily.     . prednisoLONE acetate (PRED MILD) 0.12 % ophthalmic suspension Place 1 drop into both eyes 4 (four) times daily.    Marland Kitchen zolpidem (AMBIEN) 5 MG tablet Take 5 mg by mouth at bedtime as needed for sleep.     No facility-administered medications prior to visit.    ROS Review of Systems  Constitutional: Negative for chills and fatigue.  HENT: Negative.   Eyes: Negative.   Respiratory: Negative for cough, chest tightness, shortness of breath and wheezing.   Cardiovascular: Negative for chest pain,  palpitations and leg swelling.  Gastrointestinal: Negative for abdominal pain, constipation, diarrhea, nausea and vomiting.  Endocrine: Negative.   Genitourinary: Negative.  Negative for difficulty urinating.  Musculoskeletal: Positive for arthralgias. Negative for back pain and myalgias.  Skin: Negative.  Negative for color change and pallor.  Neurological: Negative for dizziness, weakness and light-headedness.  Hematological: Negative for adenopathy. Does not bruise/bleed easily.  Psychiatric/Behavioral: Negative.     Objective:  BP 138/86   Pulse 71   Temp 98.5 F (36.9 C) (Oral)   Ht 5\' 2"  (1.575 m)   Wt 138 lb (62.6 kg)   SpO2 99%   BMI 25.24 kg/m   BP Readings from Last 3 Encounters:  06/19/20 138/86  04/24/20 (!) 162/64  10/17/19 (!) 165/71    Wt Readings from Last 3 Encounters:  06/19/20 138 lb (62.6 kg)  04/24/20 142 lb 6 oz (64.6 kg)  10/10/19 143 lb 0.6 oz (64.9 kg)    Physical Exam Vitals reviewed.  Constitutional:      Appearance: Normal appearance.  HENT:     Nose: Nose normal.  Eyes:     General: No scleral icterus.    Conjunctiva/sclera: Conjunctivae normal.  Cardiovascular:     Rate and Rhythm: Normal rate and regular rhythm.     Heart sounds: No murmur heard.   Pulmonary:     Effort: Pulmonary effort is normal.     Breath sounds: No wheezing, rhonchi  or rales.  Abdominal:     General: Abdomen is flat. Bowel sounds are normal. There is no distension.     Palpations: Abdomen is soft. There is no hepatomegaly.     Tenderness: There is no abdominal tenderness.  Musculoskeletal:        General: Normal range of motion.     Cervical back: Neck supple.     Right lower leg: No edema.     Left lower leg: No edema.     Right foot: Normal. Normal range of motion and normal capillary refill. No swelling, deformity, tenderness, bony tenderness or crepitus.     Left foot: Normal.  Lymphadenopathy:     Cervical: No cervical adenopathy.  Skin:     General: Skin is warm and dry.     Coloration: Skin is not pale.  Neurological:     General: No focal deficit present.     Mental Status: She is alert.     Lab Results  Component Value Date   WBC 5.6 04/24/2020   HGB 11.3 (L) 04/24/2020   HCT 33.8 (L) 04/24/2020   PLT 238.0 04/24/2020   GLUCOSE 109 (H) 04/24/2020   CHOL 134 10/10/2019   TRIG 36.0 10/10/2019   HDL 71.60 10/10/2019   LDLCALC 55 10/10/2019   ALT 16 10/10/2019   AST 16 10/10/2019   NA 134 (L) 04/24/2020   K 4.1 04/24/2020   CL 104 04/24/2020   CREATININE 1.63 (H) 04/24/2020   BUN 45 (H) 04/24/2020   CO2 21 04/24/2020   TSH 2.55 10/10/2019   HGBA1C 5.8 04/24/2020   MICROALBUR 0.8 09/21/2018    US RENAL  Result Date: 11/11/2018 CLINICAL DATA:  Stage III chronic kidney disease, hypertension, diabetes mellitus EXAM: RENAL / URINARY TRACT ULTRASOUND COMPLETE COMPARISON:  None FINDINGS: Right Kidney: Renal measurements: 7.8 x 4.0 x 4.0 cm = volume: 65.8 mL. Cortical thinning. Increased cortical echogenicity. No mass, hydronephrosis or shadowing calcification Left Kidney: Renal measurements: 8.9 x 4.5 x 4.5 cm = volume: 92.1 mL. Cortical thinning. Increased cortical echogenicity. Simple appearing cyst at LEFT kidney 4.4 x 4.1 x 4.5 cm. No additional mass or hydronephrosis. No shadowing calcification. Bladder: Appears normal for degree of bladder distention. 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 Simple appearing LEFT renal cyst 4.5 cm diameter. Electronically Signed   By: Lavonia Dana M.D.   On: 11/11/2018 09:28    Assessment & Plan:   Kaliyah was seen today for gout.  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  I recommended that she try a course of renally dosed colchicine. -     Colchicine (MITIGARE) 0.6 MG  CAPS; Take 1 tablet by mouth daily.   I am having Sarah [name] start on Colchicine. I am also having her maintain her dorzolamide, hydrALAZINE, dorzolamide-timolol, budesonide-formoterol, diltiazem, prednisoLONE acetate, zolpidem, Bystolic, and atorvastatin.  Meds ordered this encounter  Medications  . Colchicine (MITIGARE) 0.6 MG CAPS    Sig: Take 1 tablet by mouth daily.    Dispense:  21 capsule    Refill:  0     Follow-up: Return in about 3 weeks (around 07/10/2020).  Scarlette Calico, MD

## 2020-06-19 NOTE — Patient Instructions (Signed)
Gout  Gout is a condition that causes painful swelling of the joints. Gout is a type of inflammation of the joints (arthritis). This condition is caused by having too much uric acid in the body. Uric acid is a chemical that forms when the body breaks down substances called purines. Purines are important for building body proteins. When the body has too much uric acid, sharp crystals can form and build up inside the joints. This causes pain and swelling. Gout attacks can happen quickly and may be very painful (acute gout). Over time, the attacks can affect more joints and become more frequent (chronic gout). Gout can also cause uric acid to build up under the skin and inside the kidneys. What are the causes? This condition is caused by too much uric acid in your blood. This can happen because:  Your kidneys do not remove enough uric acid from your blood. This is the most common cause.  Your body makes too much uric acid. This can happen with some cancers and cancer treatments. It can also occur if your body is breaking down too many red blood cells (hemolytic anemia).  You eat too many foods that are high in purines. These foods include organ meats and some seafood. Alcohol, especially beer, is also high in purines. A gout attack may be triggered by trauma or stress. What increases the risk? You are more likely to develop this condition if you:  Have a family history of gout.  Are female and middle-aged.  Are female and have gone through menopause.  Are obese.  Frequently drink alcohol, especially beer.  Are dehydrated.  Lose weight too quickly.  Have an organ transplant.  Have lead poisoning.  Take certain medicines, including aspirin, cyclosporine, diuretics, levodopa, and niacin.  Have kidney disease.  Have a skin condition called psoriasis. What are the signs or symptoms? An attack of acute gout happens quickly. It usually occurs in just one joint. The most common place is  the big toe. Attacks often start at night. Other joints that may be affected include joints of the feet, ankle, knee, fingers, wrist, or elbow. Symptoms of this condition may include:  Severe pain.  Warmth.  Swelling.  Stiffness.  Tenderness. The affected joint may be very painful to touch.  Shiny, red, or purple skin.  Chills and fever. Chronic gout may cause symptoms more frequently. More joints may be involved. You may also have white or yellow lumps (tophi) on your hands or feet or in other areas near your joints. How is this diagnosed? This condition is diagnosed based on your symptoms, medical history, and physical exam. You may have tests, such as:  Blood tests to measure uric acid levels.  Removal of joint fluid with a thin needle (aspiration) to look for uric acid crystals.  X-rays to look for joint damage. How is this treated? Treatment for this condition has two phases: treating an acute attack and preventing future attacks. Acute gout treatment may include medicines to reduce pain and swelling, including:  NSAIDs.  Steroids. These are strong anti-inflammatory medicines that can be taken by mouth (orally) or injected into a joint.  Colchicine. This medicine relieves pain and swelling when it is taken soon after an attack. It can be given by mouth or through an IV. Preventive treatment may include:  Daily use of smaller doses of NSAIDs or colchicine.  Use of a medicine that reduces uric acid levels in your blood.  Changes to your diet. You may   need to see a dietitian about what to eat and drink to prevent gout. Follow these instructions at home: During a gout attack   If directed, put ice on the affected area: ? Put ice in a plastic bag. ? Place a towel between your skin and the bag. ? Leave the ice on for 20 minutes, 2-3 times a day.  Raise (elevate) the affected joint above the level of your heart as often as possible.  Rest the joint as much as possible.  If the affected joint is in your leg, you may be given crutches to use.  Follow instructions from your health care provider about eating or drinking restrictions. Avoiding future gout attacks  Follow a low-purine diet as told by your dietitian or health care provider. Avoid foods and drinks that are high in purines, including liver, kidney, anchovies, asparagus, herring, mushrooms, mussels, and beer.  Maintain a healthy weight or lose weight if you are overweight. If you want to lose weight, talk with your health care provider. It is important that you do not lose weight too quickly.  Start or maintain an exercise program as told by your health care provider. Eating and drinking  Drink enough fluids to keep your urine pale yellow.  If you drink alcohol: ? Limit how much you use to:  0-1 drink a day for women.  0-2 drinks a day for men. ? Be aware of how much alcohol is in your drink. In the [place] one drink equals one 12 oz bottle of beer (355 mL) one 5 oz glass of wine (148 mL), or one 1 oz glass of hard liquor (44 mL). General instructions  Take over-the-counter and prescription medicines only as told by your health care provider.  Do not drive or use heavy machinery while taking prescription pain medicine.  Return to your normal activities as told by your health care provider. Ask your health care provider what activities are safe for you.  Keep all follow-up visits as told by your health care provider. This is important. Contact a health care provider if you have:  Another gout attack.  Continuing symptoms of a gout attack after 10 days of treatment.  Side effects from your medicines.  Chills or a fever.  Burning pain when you urinate.  Pain in your lower back or belly. Get help right away if you:  Have severe or uncontrolled pain.  Cannot urinate. Summary  Gout is painful swelling of the joints caused by inflammation.  The most common site of pain is the big  toe, but it can affect other joints in the body.  Medicines and dietary changes can help to prevent and treat gout attacks. This information is not intended to replace advice given to you by your health care provider. Make sure you discuss any questions you have with your health care provider. Document Revised: 06/09/2018 Document Reviewed: 06/09/2018 Elsevier Patient Education  2020 [employer]  

## 2020-07-19 ENCOUNTER — Ambulatory Visit: Payer: Medicare Other | Admitting: Pharmacist

## 2020-07-19 ENCOUNTER — Other Ambulatory Visit: Payer: Self-pay | Admitting: Internal Medicine

## 2020-07-19 ENCOUNTER — Other Ambulatory Visit: Payer: Self-pay

## 2020-07-19 DIAGNOSIS — J4540 Moderate persistent asthma, uncomplicated: Secondary | ICD-10-CM

## 2020-07-19 NOTE — Patient Instructions (Addendum)
Visit Information  Phone number for Pharmacist: [phone]  Goals Addressed            This Visit's Progress   . Pharmacy Care Plan       CARE PLAN ENTRY  Current Barriers:  . Chronic Disease Management support, education, and care coordination needs related to Hypertension, Hyperlipidemia, and Gout   Hypertension BP Readings from Last 3 Encounters:  04/24/20 (!) 162/64  10/17/19 (!) 165/71  10/10/19 (!) 148/64 .  Pharmacist Clinical Goal(s): o Over the next 90 days, patient will work with PharmD and providers to achieve BP goal <140/90 . Current regimen:  o Diltiazem 300 mg daily o Bystolic 20 mg daily o Hydralazine 50 mg 3 times daily . Interventions: o Discussed blood pressure goal and benefits of medications o Pursue patient assistance for Bystolic . Patient self care activities - Over the next 30 days, patient will: o Check BP daily, document, and provide at future appointments o Ensure daily salt intake < 2300 mg/day  Hyperlipidemia Lab Results  Component Value Date/Time   LDLCALC 55 10/10/2019 02:43 PM .  Pharmacist Clinical Goal(s): o Over the next 90 days, patient will work with PharmD and providers to maintain LDL goal < 70 . Current regimen:  o Atorvastatin 10 mg daily . Interventions: o Discussed benefits of statin including cholesterol lowering and cardiovascular risk reduction . Patient self care activities - Over the next 90 days, patient will: o Continue medication as directed o Continue low cholesterol diet  Gout . Pharmacist Clinical Goal(s) o Over the next 90 days, patient will work with PharmD and providers to optimize therapy . Current regimen:  o No medication . Interventions: o Discussed low purine diet to prevent gout flares . Patient self care activities - Over the next 90 days, patient will: o Avoid foods high in purines (red meat, seafood, alcohol)  Medication management . Pharmacist Clinical Goal(s): o Over the next 90 days,  patient will work with PharmD and providers to maintain optimal medication adherence . Current pharmacy: [facility] . Interventions o Comprehensive medication review performed. o Switch to [facility] mail order pharmacy for cost savings . Patient self care activities - Over the next 90 days, patient will: o Focus on medication adherence by pill count o Take medications as prescribed o Report any questions or concerns to PharmD and/or provider(s)  Please see past updates related to this goal by clicking on the "Past Updates" button in the selected goal        Patient verbalizes understanding of instructions provided today.   Telephone follow up appointment with pharmacy team member scheduled for: 3 months  Charlene Brooke, PharmD Clinical Pharmacist [facility] [phone] Zolpidem tablets What is this medicine? ZOLPIDEM (zole PI dem) is used to treat insomnia. This medicine helps you to fall asleep and sleep through the night. This medicine may be used for other purposes; ask your health care provider or pharmacist if you have questions. COMMON BRAND NAME(S): Ambien What should I tell my health care provider before I take this medicine? They need to know if you have any of these conditions:  depression  history of drug abuse or addiction  if you often drink alcohol  liver disease  lung or breathing disease  myasthenia gravis  sleep apnea  sleep-walking, driving, eating or other activity while not fully awake after taking a sleep medicine  suicidal thoughts, plans, or attempt; a previous suicide attempt by you or a family  member  an unusual or allergic reaction to zolpidem, other medicines, foods, dyes, or preservatives  pregnant or trying to get pregnant  breast-feeding How should I use this medicine? Take this medicine by mouth with a glass of water. Follow the directions on the prescription label. It is better to take this  medicine on an empty stomach and only when you are ready for bed. Do not take your medicine more often than directed. If you have been taking this medicine for several weeks and suddenly stop taking it, you may get unpleasant withdrawal symptoms. Your doctor or health care professional may want to gradually reduce the dose. Do not stop taking this medicine on your own. Always follow your doctor or health care professional's advice. A special MedGuide will be given to you by the pharmacist with each prescription and refill. Be sure to read this information carefully each time. Talk to your pediatrician regarding the use of this medicine in children. Special care may be needed. Overdosage: If you think you have taken too much of this medicine contact a poison control center or emergency room at once. NOTE: This medicine is only for you. Do not share this medicine with others. What if I miss a dose? This does not apply. This medicine should only be taken immediately before going to sleep. Do not take double or extra doses. What may interact with this medicine?  alcohol  antihistamines for allergy, cough and cold  certain medicines for anxiety or sleep  certain medicines for depression, like amitriptyline, fluoxetine, sertraline  certain medicines for fungal infections like ketoconazole and itraconazole  certain medicines for seizures like phenobarbital, primidone  ciprofloxacin  dietary supplements for sleep, like valerian or kava kava  general anesthetics like halothane, isoflurane, methoxyflurane, propofol  local anesthetics like lidocaine, pramoxine, tetracaine  medicines that relax muscles for surgery  narcotic medicines for pain  phenothiazines like chlorpromazine, mesoridazine, prochlorperazine, thioridazine  rifampin This list may not describe all possible interactions. Give your health care provider a list of all the medicines, herbs, non-prescription drugs, or dietary  supplements you use. Also tell them if you smoke, drink alcohol, or use illegal drugs. Some items may interact with your medicine. What should I watch for while using this medicine? Visit your doctor or health care professional for regular checks on your progress. Keep a regular sleep schedule by going to bed at about the same time each night. Avoid caffeine-containing drinks in the evening hours. When sleep medicines are used every night for more than a few weeks, they may stop working. Talk to your doctor if you still have trouble sleeping. After taking this medicine, you may get up out of bed and do an activity that you do not know you are doing. The next morning, you may have no memory of this. Activities include driving a car ("sleep-driving"), making and eating food, talking on the phone, sexual activity, and sleep-walking. Serious injuries have occurred. Stop the medicine and call your doctor right away if you find out you have done any of these activities. Do not take this medicine if you have used alcohol that evening. Do not take it if you have taken another medicine for sleep. The risk of doing these sleep-related activities is higher. Wait for at least 8 hours after you take a dose before driving or doing other activities that require full mental alertness. Do not take this medicine unless you are able to stay in bed for a full night (7 to 8  hours) before you must be active again. You may have a decrease in mental alertness the day after use, even if you feel that you are fully awake. Tell your doctor if you will need to perform activities requiring full alertness, such as driving, the next day. Do not stand or sit up quickly after taking this medicine, especially if you are an older patient. This reduces the risk of dizzy or fainting spells. If you or your family notice any changes in your behavior, such as new or worsening depression, thoughts of harming yourself, anxiety, other unusual or  disturbing thoughts, or memory loss, call your doctor right away. After you stop taking this medicine, you may have trouble falling asleep. This is called rebound insomnia. This problem usually goes away on its own after 1 or 2 nights. What side effects may I notice from receiving this medicine? Side effects that you should report to your doctor or health care professional as soon as possible:  allergic reactions like skin rash, itching or hives, swelling of the face, lips, or tongue  breathing problems  changes in vision  confusion  depressed mood or other changes in moods or emotions  feeling faint or lightheaded, falls  hallucinations  loss of balance or coordination  loss of memory  numbness or tingling of the tongue  restlessness, excitability, or feelings of anxiety or agitation  signs and symptoms of liver injury like dark yellow or Jablon urine; general ill feeling or flu-like symptoms; light-colored stools; loss of appetite; nausea; right upper belly pain; unusually weak or tired; yellowing of the eyes or skin  suicidal thoughts  unusual activities while not fully awake like driving, eating, making phone calls, or sexual activity Side effects that usually do not require medical attention (report to your doctor or health care professional if they continue or are bothersome):  dizziness  drowsiness the day after you take this medicine  headache This list may not describe all possible side effects. Call your doctor for medical advice about side effects. You may report side effects to FDA at 1-800-FDA-1088. Where should I keep my medicine? Keep out of the reach of children. This medicine can be abused. Keep your medicine in a safe place to protect it from theft. Do not share this medicine with anyone. Selling or giving away this medicine is dangerous and against the law. This medicine may cause accidental overdose and death if taken by other adults, children, or pets.  Mix any unused medicine with a substance like cat litter or coffee grounds. Then throw the medicine away in a sealed container like a sealed bag or a coffee can with a lid. Do not use the medicine after the expiration date. Store at room temperature between 20 and 25 degrees C (68 and 77 degrees F). NOTE: This sheet is a summary. It may not cover all possible information. If you have questions about this medicine, talk to your doctor, pharmacist, or health care provider.  2020 Elsevier/Gold Standard (2018-08-06 11:51:08)

## 2020-07-19 NOTE — Chronic Care Management (AMB) (Signed)
Chronic Care Management Pharmacy  Name: Sarah [name]  MRN: [id number] DOB: 1941-10-30  Chief Complaint/ HPI  Sarah [name],  79 y.o. , female presents for their Follow-Up CCM visit with the clinical pharmacist via telephone.  PCP : [name], MD  Patient Care Team: [name], MD as PCP - General (Internal Medicine) Paulla Dolly Tamala Fothergill, DPM as Consulting Physician (Podiatry) Claudia Desanctis, MD as Consulting Physician (Internal Medicine) Charlton Haws, [facility] as Pharmacist (Pharmacist)  Their chronic conditions include: Hypertension, Hyperlipidemia, Diabetes, GERD, Asthma, Chronic Kidney Disease, Anxiety and Overactive Bladder  Office Visits: 06/19/20 Dr Ronnald Ramp OV: acute visit for gout flare, rx'd colchicine 0.6 mg daily  04/24/20 Dr Ronnald Ramp OV: conditions stable, BP controlled at home, no med changes. 10/24/19 Emelia Loron AWV 10/10/19 Dr Ronnald Ramp OV: stop Vitamin D due to level ULN. Refer to nephrologist re: hyperuricemia. No other med changes 08/01/19 Dr Ronnald Ramp OV: BP elevated, increased Bystolic to 20 mg.   Consult Visit: 11/28/19 Dr Paulla Dolly (podiatry): plantar fasciitis - injected Kenalog/Xylocaine  Medications: Outpatient Encounter Medications as of 07/19/2020  Medication Sig  . atorvastatin (LIPITOR) 10 MG tablet TAKE 1 TABLET(10 MG) BY MOUTH DAILY  . BYSTOLIC 20 MG TABS TAKE 1 TABLET BY MOUTH DAILY  . diltiazem (TIAZAC) 300 MG 24 hr capsule TAKE 1 CAPSULE BY MOUTH DAILY BEFORE BREAKFAST  . dorzolamide (TRUSOPT) 2 % ophthalmic solution INSTILL 1 DROP INTO OU BID  . dorzolamide-timolol (COSOPT) 22.3-6.8 MG/ML ophthalmic solution INT 1 DROP IN OU BID  . hydrALAZINE (APRESOLINE) 100 MG tablet Take 50 mg by mouth 3 (three) times daily.   . prednisoLONE acetate (PRED MILD) 0.12 % ophthalmic suspension Place 1 drop into both eyes 4 (four) times daily.  . SYMBICORT 160-4.5 MCG/ACT inhaler INHALE 2 PUFFS INTO THE LUNGS TWICE DAILY  . zolpidem (AMBIEN) 5 MG tablet Take 5 mg by  mouth at bedtime as needed for sleep.  . Colchicine (MITIGARE) 0.6 MG CAPS Take 1 tablet by mouth daily. (Patient not taking: Reported on 07/19/2020)  . [DISCONTINUED] budesonide-formoterol (SYMBICORT) 160-4.5 MCG/ACT inhaler Inhale 2 puffs into the lungs 2 (two) times daily.  . [DISCONTINUED] valsartan (DIOVAN) 160 MG tablet Take 1 tablet (160 mg total) by mouth daily.   No facility-administered encounter medications on file as of 07/19/2020.     Current Diagnosis/Assessment:   Goals Addressed            This Visit's Progress   . Pharmacy Care Plan       CARE PLAN ENTRY  Current Barriers:  . Chronic Disease Management support, education, and care coordination needs related to Hypertension, Hyperlipidemia, and Gout   Hypertension BP Readings from Last 3 Encounters:  04/24/20 (!) 162/64  10/17/19 (!) 165/71  10/10/19 (!) 148/64 .  Pharmacist Clinical Goal(s): o Over the next 90 days, patient will work with PharmD and providers to achieve BP goal <140/90 . Current regimen:  o Diltiazem 300 mg daily o Bystolic 20 mg daily o Hydralazine 50 mg 3 times daily . Interventions: o Discussed blood pressure goal and benefits of medications o Pursue patient assistance for Bystolic . Patient self care activities - Over the next 30 days, patient will: o Check BP daily, document, and provide at future appointments o Ensure daily salt intake < 2300 mg/day  Hyperlipidemia Lab Results  Component Value Date/Time   LDLCALC 55 10/10/2019 02:43 PM .  Pharmacist Clinical Goal(s): o Over the next 90 days, patient will work with  PharmD and providers to maintain LDL goal < 70 . Current regimen:  o Atorvastatin 10 mg daily . Interventions: o Discussed benefits of statin including cholesterol lowering and cardiovascular risk reduction . Patient self care activities - Over the next 90 days, patient will: o Continue medication as directed o Continue low cholesterol diet  Gout . Pharmacist  Clinical Goal(s) o Over the next 90 days, patient will work with PharmD and providers to optimize therapy . Current regimen:  o No medication . Interventions: o Discussed low purine diet to prevent gout flares . Patient self care activities - Over the next 90 days, patient will: o Avoid foods high in purines (red meat, seafood, alcohol)  Medication management . Pharmacist Clinical Goal(s): o Over the next 90 days, patient will work with PharmD and providers to maintain optimal medication adherence . Current pharmacy: [facility] . Interventions o Comprehensive medication review performed. o Switch to [facility] mail order pharmacy for cost savings . Patient self care activities - Over the next 90 days, patient will: o Focus on medication adherence by pill count o Take medications as prescribed o Report any questions or concerns to PharmD and/or provider(s)  Please see past updates related to this goal by clicking on the "Past Updates" button in the selected goal         Hypertension   BP goal < 140/90  Office blood pressures are  BP Readings from Last 3 Encounters:  06/19/20 138/86  04/24/20 (!) 162/64  10/17/19 (!) 165/71   Kidney Function Lab Results  Component Value Date/Time   CREATININE 1.63 (H) 04/24/2020 02:20 PM   CREATININE 1.66 (H) 10/10/2019 02:43 PM   GFR 36.90 (L) 04/24/2020 02:20 PM   GFRNONAA 54 (L) 01/07/2013 10:50 AM   GFRAA 63 (L) 01/07/2013 10:50 AM   Patient has failed these meds in the past: valsartan, chlorthalidone, HCTZ, metoprolol, losartan Patient is currently controlled on the following medications:   Diltiazem 213 mg qAM  Bystolic 20 mg daily  Hydralazine 50 mg TID    Patient checks BP at home not checking - battery ran out  Patient home BP readings are ranging: n/a  We discussed: Pt reports improvement in dizziness since reducing hydralazine from 100 mg to 50 mg. She is also going to follow up with nephrology this  month.  Bystolic PAP: initially denied, faxed appeal with prescription costs 07/18/20.  Plan  Continue current medications  Pursue PAP for Bystolic  Hyperlipidemia   LDL goal < 100  Lipid Panel     Component Value Date/Time   CHOL 134 10/10/2019 1443   TRIG 36.0 10/10/2019 1443   HDL 71.60 10/10/2019 1443   CHOLHDL 2 10/10/2019 1443   VLDL 7.2 10/10/2019 1443   LDLCALC 55 10/10/2019 1443     The 10-year ASCVD risk score Mikey Bussing DC Jr., et al., 2013) is: 33.4%   Values used to calculate the score:     Age: 65 years     Sex: Female     Is Non-Hispanic African American: Yes     Diabetic: Yes     Tobacco smoker: No     Systolic Blood Pressure: 086 mmHg     Is BP treated: Yes     HDL Cholesterol: 71.6 mg/dL     Total Cholesterol: 134 mg/dL   Patient has failed these meds in past: n/a Patient is currently controlled on the following medications:   Atorvastatin 10 mg daily  We discussed:  diet and exercise  extensively; cholesterol goals and benefits of statin for CV risk reduction  Plan  Continue current medications and control with diet and exercise  Gout    Ref. Range 09/21/2018 15:14 10/10/2019 14:43  Uric Acid, Serum Latest Ref Range: 2.4 - 7.0 mg/dL 9.7 (H) 9.1 (H)   Patient has failed these meds in past: allopurinol Patient is currently uncontrolled on the following medications:  . Colchicine 0.6 mg daily  We discussed:  Pt reports improvement in gout on after colchicine course. She is now changing her diet to prevent gout flares - avoiding red meat, seafood, whole milk.  Pt has multiple risk factors for AHS (allopurinol hypersensitivity syndrome): kidney impairment (worsened since last time she took allopurinol), age > 31, female.    Plan  Continue to manage with diet  Insomnia/Anxiety   Patient has failed these meds in past: n/a Patient is currently controlled on the following medications:   Zolpidem 5 mg HS prn  We discussed:  Pt requesting refill  for [facility]olpidem to [facility].  Plan  Continue current medications Request zolpidem refill from PCP  Medication Management   Pt uses [place] for all medications Uses pill box? No - keeps in vials Pt endorses 100% compliance  We discussed: [facility] is the preferred pharmacy with her insurance and she is satisfied with their services.  Plan  Continue current medication management strategy    Follow up: 3 month phone visit  Charlene Brooke, PharmD Clinical Pharmacist [facility] at [facility] [phone]

## 2020-07-20 ENCOUNTER — Telehealth: Payer: Self-pay | Admitting: Pharmacist

## 2020-07-20 DIAGNOSIS — H401131 Primary open-angle glaucoma, bilateral, mild stage: Secondary | ICD-10-CM | POA: Diagnosis not present

## 2020-07-20 DIAGNOSIS — H2013 Chronic iridocyclitis, bilateral: Secondary | ICD-10-CM | POA: Diagnosis not present

## 2020-07-20 DIAGNOSIS — H59033 Cystoid macular edema following cataract surgery, bilateral: Secondary | ICD-10-CM | POA: Diagnosis not present

## 2020-07-20 NOTE — Progress Notes (Signed)
    Chronic Care Management Pharmacy Assistant   Name: Sarah [name]  MRN: [id number] DOB: 13-Jan-1941  Reason for Encounter: PAP    PCP : [name], MD  Allergies:   Allergies  Allergen Reactions  . Lisinopril     REACTION: Lightheaded "sick"    Medications: Outpatient Encounter Medications as of 07/20/2020  Medication Sig  . atorvastatin (LIPITOR) 10 MG tablet TAKE 1 TABLET(10 MG) BY MOUTH DAILY  . BYSTOLIC 20 MG TABS TAKE 1 TABLET BY MOUTH DAILY  . Colchicine (MITIGARE) 0.6 MG CAPS Take 1 tablet by mouth daily. (Patient not taking: Reported on 07/19/2020)  . diltiazem (TIAZAC) 300 MG 24 hr capsule TAKE 1 CAPSULE BY MOUTH DAILY BEFORE BREAKFAST  . dorzolamide (TRUSOPT) 2 % ophthalmic solution INSTILL 1 DROP INTO OU BID  . dorzolamide-timolol (COSOPT) 22.3-6.8 MG/ML ophthalmic solution INT 1 DROP IN OU BID  . hydrALAZINE (APRESOLINE) 100 MG tablet Take 50 mg by mouth 3 (three) times daily.   . prednisoLONE acetate (PRED MILD) 0.12 % ophthalmic suspension Place 1 drop into both eyes 4 (four) times daily.  . SYMBICORT 160-4.5 MCG/ACT inhaler INHALE 2 PUFFS INTO THE LUNGS TWICE DAILY  . zolpidem (AMBIEN) 5 MG tablet Take 5 mg by mouth at bedtime as needed for sleep.  . [DISCONTINUED] budesonide-formoterol (SYMBICORT) 160-4.5 MCG/ACT inhaler Inhale 2 puffs into the lungs 2 (two) times daily.  . [DISCONTINUED] valsartan (DIOVAN) 160 MG tablet Take 1 tablet (160 mg total) by mouth daily.   No facility-administered encounter medications on file as of 07/20/2020.    Current Diagnosis: Patient Active Problem List   Diagnosis Date Noted  . Acute gout due to renal impairment involving right foot 06/19/2020  . Anemia, chronic disease 10/10/2019  . Plantar fasciitis, left 10/10/2019  . Osteoarthritis of both feet 10/06/2018  . CRI (chronic renal insufficiency), stage 3 (moderate) 09/21/2018  . Vitamin D deficiency disease 09/21/2018  . OAB (overactive bladder) 10/15/2017  .  Insomnia due to anxiety and fear 06/01/2017  . Gastroesophageal reflux disease with esophagitis 10/20/2016  . Asthma in adult, moderate persistent, uncomplicated 94/85/4627  . Allergic rhinitis due to pollen 04/19/2015  . Type II diabetes mellitus with manifestations ([place]) 08/14/2014  . Hyperlipidemia with target LDL less than 100 08/14/2014  . Hyperuricemia 02/29/2012  . Routine health maintenance 09/10/2011  . Essential hypertension 08/20/2007    Goals Addressed   None     Follow-Up:  Patient Assistance Coordination    Per Clinical Pharmacist I was instructed to follow up on the appeal for the patients PAP with [employer]. I was informed that they did receive the patients prescription  Cost form they are processing the paper work and it will take 5-7 business days for them to process the claim. Once they get a answer they will reach out to the patient as well as Dr. Scarlette Calico office. Will forward information to pharmacist.    Rosendo Gros, [place] Pharmacist Assistant  [phone]

## 2020-07-25 DIAGNOSIS — N184 Chronic kidney disease, stage 4 (severe): Secondary | ICD-10-CM | POA: Diagnosis not present

## 2020-07-25 DIAGNOSIS — D631 Anemia in chronic kidney disease: Secondary | ICD-10-CM | POA: Diagnosis not present

## 2020-08-01 ENCOUNTER — Other Ambulatory Visit: Payer: Self-pay | Admitting: Internal Medicine

## 2020-08-07 DIAGNOSIS — N1831 Chronic kidney disease, stage 3a: Secondary | ICD-10-CM | POA: Diagnosis not present

## 2020-08-07 DIAGNOSIS — I129 Hypertensive chronic kidney disease with stage 1 through stage 4 chronic kidney disease, or unspecified chronic kidney disease: Secondary | ICD-10-CM | POA: Diagnosis not present

## 2020-08-07 DIAGNOSIS — D649 Anemia, unspecified: Secondary | ICD-10-CM | POA: Diagnosis not present

## 2020-08-07 DIAGNOSIS — N281 Cyst of kidney, acquired: Secondary | ICD-10-CM | POA: Diagnosis not present

## 2020-08-15 ENCOUNTER — Other Ambulatory Visit: Payer: Self-pay | Admitting: Internal Medicine

## 2020-08-15 DIAGNOSIS — I10 Essential (primary) hypertension: Secondary | ICD-10-CM

## 2020-08-16 ENCOUNTER — Telehealth: Payer: Self-pay | Admitting: Pharmacist

## 2020-08-16 NOTE — Progress Notes (Signed)
    Chronic Care Management Pharmacy Assistant   Name: Sarah [name]  MRN: [id number] DOB: Dec 21, 1940  Reason for Encounter: Medication Review  PCP : [name], MD  Allergies:   Allergies  Allergen Reactions  . Lisinopril     REACTION: Lightheaded "sick"    Medications: Outpatient Encounter Medications as of 08/16/2020  Medication Sig  . atorvastatin (LIPITOR) 10 MG tablet TAKE 1 TABLET(10 MG) BY MOUTH DAILY  . BYSTOLIC 20 MG TABS TAKE 1 TABLET BY MOUTH DAILY  . Colchicine (MITIGARE) 0.6 MG CAPS Take 1 tablet by mouth daily. (Patient not taking: Reported on 07/19/2020)  . diltiazem (TIAZAC) 300 MG 24 hr capsule TAKE 1 CAPSULE BY MOUTH DAILY BEFORE BREAKFAST  . dorzolamide (TRUSOPT) 2 % ophthalmic solution INSTILL 1 DROP INTO OU BID  . dorzolamide-timolol (COSOPT) 22.3-6.8 MG/ML ophthalmic solution INT 1 DROP IN OU BID  . hydrALAZINE (APRESOLINE) 100 MG tablet Take 50 mg by mouth 3 (three) times daily.   . prednisoLONE acetate (PRED MILD) 0.12 % ophthalmic suspension Place 1 drop into both eyes 4 (four) times daily.  . SYMBICORT 160-4.5 MCG/ACT inhaler INHALE 2 PUFFS INTO THE LUNGS TWICE DAILY  . zolpidem (AMBIEN) 5 MG tablet TAKE 1 TABLET(5 MG) BY MOUTH AT BEDTIME AS NEEDED FOR SLEEP  . [DISCONTINUED] budesonide-formoterol (SYMBICORT) 160-4.5 MCG/ACT inhaler Inhale 2 puffs into the lungs 2 (two) times daily.  . [DISCONTINUED] valsartan (DIOVAN) 160 MG tablet Take 1 tablet (160 mg total) by mouth daily.   No facility-administered encounter medications on file as of 08/16/2020.    Current Diagnosis: Patient Active Problem List   Diagnosis Date Noted  . Acute gout due to renal impairment involving right foot 06/19/2020  . Anemia, chronic disease 10/10/2019  . Plantar fasciitis, left 10/10/2019  . Osteoarthritis of both feet 10/06/2018  . CRI (chronic renal insufficiency), stage 3 (moderate) 09/21/2018  . Vitamin D deficiency disease 09/21/2018  . OAB (overactive bladder)  10/15/2017  . Insomnia due to anxiety and fear 06/01/2017  . Gastroesophageal reflux disease with esophagitis 10/20/2016  . Asthma in adult, moderate persistent, uncomplicated 20/60/1561  . Allergic rhinitis due to pollen 04/19/2015  . Type II diabetes mellitus with manifestations ([place]) 08/14/2014  . Hyperlipidemia with target LDL less than 100 08/14/2014  . Hyperuricemia 02/29/2012  . Routine health maintenance 09/10/2011  . Essential hypertension 08/20/2007    Goals Addressed   None     Follow-Up:  Pharmacist Review    Per clinical pharmacist the patient called her on 08/14/2020. I returned the patients call she stated that she needs a refill on her Bystolic to be sent to [place]. Will forward this information to Clinical Pharmacist to refill.   Rosendo Gros, [place] Pharmacist Assistant  [phone]

## 2020-08-16 NOTE — Progress Notes (Addendum)
Bystolic patient assistance application is still pending. Spoke with representative from [employer] assist, they have all the required paperwork including out of pocket costs and they will make a decision to approve/deny in the next 3-5 business days. I called Sarah [name] and asked her to fill a 98-VSY supply of Bystolic in case the application is approved this month. Pt voiced understanding.

## 2020-08-20 DIAGNOSIS — N1831 Chronic kidney disease, stage 3a: Secondary | ICD-10-CM | POA: Diagnosis not present

## 2020-08-21 DIAGNOSIS — H401131 Primary open-angle glaucoma, bilateral, mild stage: Secondary | ICD-10-CM | POA: Diagnosis not present

## 2020-08-21 DIAGNOSIS — H2013 Chronic iridocyclitis, bilateral: Secondary | ICD-10-CM | POA: Diagnosis not present

## 2020-08-21 DIAGNOSIS — H59033 Cystoid macular edema following cataract surgery, bilateral: Secondary | ICD-10-CM | POA: Diagnosis not present

## 2020-08-27 DIAGNOSIS — H2013 Chronic iridocyclitis, bilateral: Secondary | ICD-10-CM | POA: Diagnosis not present

## 2020-08-27 DIAGNOSIS — H401131 Primary open-angle glaucoma, bilateral, mild stage: Secondary | ICD-10-CM | POA: Diagnosis not present

## 2020-08-27 DIAGNOSIS — H59033 Cystoid macular edema following cataract surgery, bilateral: Secondary | ICD-10-CM | POA: Diagnosis not present

## 2020-08-30 NOTE — Progress Notes (Signed)
Contacted [employer] Assist for update on Bystolic application. Per representative patient's insurance info is missing. Representative verified this is the only thing missing from application. Faxed copy of [place] Medicare card.

## 2020-08-31 DIAGNOSIS — H401131 Primary open-angle glaucoma, bilateral, mild stage: Secondary | ICD-10-CM | POA: Diagnosis not present

## 2020-08-31 DIAGNOSIS — H59033 Cystoid macular edema following cataract surgery, bilateral: Secondary | ICD-10-CM | POA: Diagnosis not present

## 2020-08-31 DIAGNOSIS — H2013 Chronic iridocyclitis, bilateral: Secondary | ICD-10-CM | POA: Diagnosis not present

## 2020-09-10 DIAGNOSIS — H59033 Cystoid macular edema following cataract surgery, bilateral: Secondary | ICD-10-CM | POA: Diagnosis not present

## 2020-09-10 DIAGNOSIS — H401131 Primary open-angle glaucoma, bilateral, mild stage: Secondary | ICD-10-CM | POA: Diagnosis not present

## 2020-09-10 DIAGNOSIS — H2013 Chronic iridocyclitis, bilateral: Secondary | ICD-10-CM | POA: Diagnosis not present

## 2020-09-27 DIAGNOSIS — H401131 Primary open-angle glaucoma, bilateral, mild stage: Secondary | ICD-10-CM | POA: Diagnosis not present

## 2020-09-27 DIAGNOSIS — H2013 Chronic iridocyclitis, bilateral: Secondary | ICD-10-CM | POA: Diagnosis not present

## 2020-09-27 DIAGNOSIS — H59033 Cystoid macular edema following cataract surgery, bilateral: Secondary | ICD-10-CM | POA: Diagnosis not present

## 2020-09-30 ENCOUNTER — Other Ambulatory Visit: Payer: Self-pay | Admitting: Internal Medicine

## 2020-09-30 DIAGNOSIS — I10 Essential (primary) hypertension: Secondary | ICD-10-CM

## 2020-10-29 ENCOUNTER — Other Ambulatory Visit: Payer: Self-pay

## 2020-10-29 ENCOUNTER — Ambulatory Visit: Payer: Medicare Other | Admitting: Pharmacist

## 2020-10-29 ENCOUNTER — Other Ambulatory Visit: Payer: Self-pay | Admitting: Internal Medicine

## 2020-10-29 DIAGNOSIS — I10 Essential (primary) hypertension: Secondary | ICD-10-CM

## 2020-10-29 DIAGNOSIS — E790 Hyperuricemia without signs of inflammatory arthritis and tophaceous disease: Secondary | ICD-10-CM

## 2020-10-29 DIAGNOSIS — E785 Hyperlipidemia, unspecified: Secondary | ICD-10-CM

## 2020-10-29 MED ORDER — NEBIVOLOL HCL 20 MG PO TABS: 1.0000 | ORAL_TABLET | Freq: Every day | ORAL | 0 refills | Status: DC

## 2020-10-29 NOTE — Patient Instructions (Addendum)
Visit Information  Phone number for Pharmacist: [phone]  Goals Addressed            This Visit's Progress   . Pharmacy Care Plan       CARE PLAN ENTRY  Current Barriers:  . Chronic Disease Management support, education, and care coordination needs related to Hypertension, Hyperlipidemia, and Gout   Hypertension BP Readings from Last 3 Encounters:  06/19/20 138/86  04/24/20 (!) 162/64  10/17/19 (!) 165/71 .  Pharmacist Clinical Goal(s): o Over the next 180 days, patient will work with PharmD and providers to achieve BP goal <140/90 . Current regimen:  o Diltiazem 300 mg daily o Bystolic 20 mg daily o Hydralazine 50 mg 3 times daily . Interventions: o Discussed blood pressure goal and benefits of medications . Patient self care activities - Over the next 180 days, patient will: o Check BP daily, document, and provide at future appointments o Ensure daily salt intake < 2300 mg/day  Hyperlipidemia Lab Results  Component Value Date/Time   LDLCALC 55 10/10/2019 02:43 PM .  Pharmacist Clinical Goal(s): o Over the next 180 days, patient will work with PharmD and providers to maintain LDL goal < 70 . Current regimen:  o Atorvastatin 10 mg daily . Interventions: o Discussed benefits of statin including cholesterol lowering and cardiovascular risk reduction . Patient self care activities - Over the next 180 days, patient will: o Continue medication as directed o Continue low cholesterol diet  Gout . Pharmacist Clinical Goal(s) o Over the next 180 days, patient will work with PharmD and providers to optimize therapy . Current regimen:  o No medication . Interventions: o Discussed low purine diet to prevent gout flares . Patient self care activities - Over the next 180 days, patient will: o Avoid foods high in purines (red meat, seafood, alcohol)  Medication management . Pharmacist Clinical Goal(s): o Over the next 180 days, patient will work with PharmD and  providers to maintain optimal medication adherence . Current pharmacy: [facility] . Interventions o Comprehensive medication review performed. o Switch to [facility] order pharmacy for cost savings . Patient self care activities - Over the next 180 days, patient will: o Focus on medication adherence by pill count o Take medications as prescribed o Report any questions or concerns to PharmD and/or provider(s)  Please see past updates related to this goal by clicking on the "Past Updates" button in the selected goal        The patient verbalized understanding of instructions, educational materials, and care plan provided today and declined offer to receive copy of patient instructions, educational materials, and care plan.  Telephone follow up appointment with pharmacy team member scheduled for: 6 months  Charlene Brooke, PharmD, BCACP Clinical Pharmacist [facility] [place] A low-purine eating plan involves making food choices to limit your intake of purine. Purine is a kind of uric acid. Too much uric acid in your blood can cause certain conditions, such as gout and kidney stones. Eating a low-purine diet can help control these conditions. What are tips for following this plan? Reading food labels   Avoid foods with saturated or Trans fat.  Check the ingredient list of grains-based foods, such as bread and cereal, to make sure that they contain whole grains.  Check the ingredient list of sauces or soups to make sure they do not contain meat or fish.  When choosing soft drinks, check the ingredient list to make  sure they do not contain high-fructose corn syrup. Shopping  Buy plenty of fresh fruits and vegetables.  Avoid buying canned or fresh fish.  Buy dairy products labeled as low-fat or nonfat.  Avoid buying premade or processed foods. These foods are often high in fat, salt (sodium), and added  sugar. Cooking  Use olive oil instead of butter when cooking. Oils like olive oil, canola oil, and sunflower oil contain healthy fats. Meal planning  Learn which foods do or do not affect you. If you find out that a food tends to cause your gout symptoms to flare up, avoid eating that food. You can enjoy foods that do not cause problems. If you have any questions about a food item, talk with your dietitian or health care provider.  Limit foods high in fat, especially saturated fat. Fat makes it harder for your body to get rid of uric acid.  Choose foods that are lower in fat and are lean sources of protein. General guidelines  Limit alcohol intake to no more than 1 drink a day for nonpregnant women and 2 drinks a day for men. One drink equals 12 oz of beer, 5 oz of wine, or 1 oz of hard liquor. Alcohol can affect the way your body gets rid of uric acid.  Drink plenty of water to keep your urine clear or pale yellow. Fluids can help remove uric acid from your body.  If directed by your health care provider, take a vitamin C supplement.  Work with your health care provider and dietitian to develop a plan to achieve or maintain a healthy weight. Losing weight can help reduce uric acid in your blood. What foods are recommended? The items listed may not be a complete list. Talk with your dietitian about what dietary choices are best for you. Foods low in purines Foods low in purines do not need to be limited. These include:  All fruits.  All low-purine vegetables, pickles, and olives.  Breads, pasta, rice, cornbread, and popcorn. Cake and other baked goods.  All dairy foods.  Eggs, nuts, and nut butters.  Spices and condiments, such as salt, herbs, and vinegar.  Plant oils, butter, and margarine.  Water, sugar-free soft drinks, tea, coffee, and cocoa.  Vegetable-based soups, broths, sauces, and gravies. Foods moderate in purines Foods moderate in purines should be limited to  the amounts listed.   cup of asparagus, cauliflower, spinach, mushrooms, or green peas, each day.  2/3 cup uncooked oatmeal, each day.   cup dry wheat bran or wheat germ, each day.  2-3 ounces of meat or poultry, each day.  4-6 ounces of shellfish, such as crab, lobster, oysters, or shrimp, each day.  1 cup cooked beans, peas, or lentils, each day.  Soup, broths, or bouillon made from meat or fish. Limit these foods as much as possible. What foods are not recommended? The items listed may not be a complete list. Talk with your dietitian about what dietary choices are best for you. Limit your intake of foods high in purines, including:  Beer and other alcohol.  Meat-based gravy or sauce.  Canned or fresh fish, such as: ? Anchovies, sardines, herring, and tuna. ? Mussels and scallops. ? Codfish, trout, and haddock.  Berniece Salines.  Organ meats, such as: ? Liver or kidney. ? Tripe. ? Sweetbreads (thymus gland or pancreas).  Wild Clinical biochemist.  Yeast or yeast extract supplements.  Drinks sweetened with high-fructose corn syrup. Summary  Eating a low-purine diet can  help control conditions caused by too much uric acid in the body, such as gout or kidney stones.  Choose low-purine foods, limit alcohol, and limit foods high in fat.  You will learn over time which foods do or do not affect you. If you find out that a food tends to cause your gout symptoms to flare up, avoid eating that food. This information is not intended to replace advice given to you by your health care provider. Make sure you discuss any questions you have with your health care provider. Document Revised: 10/30/2017 Document Reviewed: 12/31/2016 Elsevier Patient Education  2020 [employer].

## 2020-10-29 NOTE — Chronic Care Management (AMB) (Signed)
Chronic Care Management Pharmacy  Name: Sarah [name]  MRN: [id number] DOB: 17-Mar-1941  Chief Complaint/ HPI  Sarah [name],  79 y.o. , female presents for her Follow-Up CCM visit with the clinical pharmacist via telephone.  PCP : [name], MD  Patient Care Team: [name], MD as PCP - General (Internal Medicine) Paulla Dolly Tamala Fothergill, DPM as Consulting Physician (Podiatry) Claudia Desanctis, MD as Consulting Physician (Internal Medicine) Charlton Haws, [facility] as Pharmacist (Pharmacist)  Chronic conditions include: Hypertension, Hyperlipidemia, Diabetes, GERD, Asthma, Chronic Kidney Disease, Anxiety and Overactive Bladder  Office Visits: 06/19/20 Dr Ronnald Ramp OV: acute visit for gout flare, rx'd colchicine 0.6 mg daily   04/24/20 Dr Ronnald Ramp OV: conditions stable, BP controlled at home, no med changes. 10/24/19 Emelia Loron AWV 10/10/19 Dr Ronnald Ramp OV: stop Vitamin D due to level ULN. Refer to nephrologist re: hyperuricemia. No other med changes 08/01/19 Dr Ronnald Ramp OV: BP elevated, increased Bystolic to 20 mg.   Consult Visit: 11/28/19 Dr Paulla Dolly (podiatry): plantar fasciitis - injected Kenalog/Xylocaine  Allergies  Allergen Reactions  . Lisinopril     REACTION: Lightheaded "sick"    Medications: Outpatient Encounter Medications as of 10/29/2020  Medication Sig  . acetaminophen (TYLENOL) 500 MG tablet Take 500 mg by mouth every 6 (six) hours as needed.  Marland Kitchen atorvastatin (LIPITOR) 10 MG tablet TAKE 1 TABLET(10 MG) BY MOUTH DAILY  . BYSTOLIC 20 MG TABS TAKE 1 TABLET BY MOUTH DAILY  . Colchicine (MITIGARE) 0.6 MG CAPS Take 1 tablet by mouth daily.  Marland Kitchen diltiazem (TIAZAC) 300 MG 24 hr capsule TAKE 1 CAPSULE BY MOUTH DAILY BEFORE BREAKFAST  . dorzolamide (TRUSOPT) 2 % ophthalmic solution INSTILL 1 DROP INTO OU BID  . dorzolamide-timolol (COSOPT) 22.3-6.8 MG/ML ophthalmic solution INT 1 DROP IN OU BID  . hydrALAZINE (APRESOLINE) 100 MG tablet Take 50 mg by mouth 3 (three) times daily.   .  prednisoLONE acetate (PRED MILD) 0.12 % ophthalmic suspension Place 1 drop into both eyes 4 (four) times daily.  . SYMBICORT 160-4.5 MCG/ACT inhaler INHALE 2 PUFFS INTO THE LUNGS TWICE DAILY  . zolpidem (AMBIEN) 5 MG tablet TAKE 1 TABLET(5 MG) BY MOUTH AT BEDTIME AS NEEDED FOR SLEEP  . [DISCONTINUED] budesonide-formoterol (SYMBICORT) 160-4.5 MCG/ACT inhaler Inhale 2 puffs into the lungs 2 (two) times daily.  . [DISCONTINUED] diltiazem (TIAZAC) 300 MG 24 hr capsule TAKE 1 CAPSULE BY MOUTH DAILY BEFORE BREAKFAST  . [DISCONTINUED] valsartan (DIOVAN) 160 MG tablet Take 1 tablet (160 mg total) by mouth daily.   No facility-administered encounter medications on file as of 10/29/2020.   Wt Readings from Last 3 Encounters:  06/19/20 138 lb (62.6 kg)  04/24/20 142 lb 6 oz (64.6 kg)  10/10/19 143 lb 0.6 oz (64.9 kg)     Current Diagnosis/Assessment:   Goals Addressed            This Visit's Progress   . Pharmacy Care Plan       CARE PLAN ENTRY  Current Barriers:  . Chronic Disease Management support, education, and care coordination needs related to Hypertension, Hyperlipidemia, and Gout   Hypertension BP Readings from Last 3 Encounters:  06/19/20 138/86  04/24/20 (!) 162/64  10/17/19 (!) 165/71 .  Pharmacist Clinical Goal(s): o Over the next 180 days, patient will work with PharmD and providers to achieve BP goal <140/90 . Current regimen:  o Diltiazem 300 mg daily o Bystolic 20 mg daily o Hydralazine 50 mg 3 times daily .  Interventions: o Discussed blood pressure goal and benefits of medications . Patient self care activities - Over the next 180 days, patient will: o Check BP daily, document, and provide at future appointments o Ensure daily salt intake < 2300 mg/day  Hyperlipidemia Lab Results  Component Value Date/Time   LDLCALC 55 10/10/2019 02:43 PM .  Pharmacist Clinical Goal(s): o Over the next 180 days, patient will work with PharmD and providers to maintain LDL  goal < 70 . Current regimen:  o Atorvastatin 10 mg daily . Interventions: o Discussed benefits of statin including cholesterol lowering and cardiovascular risk reduction . Patient self care activities - Over the next 180 days, patient will: o Continue medication as directed o Continue low cholesterol diet  Gout . Pharmacist Clinical Goal(s) o Over the next 180 days, patient will work with PharmD and providers to optimize therapy . Current regimen:  o No medication . Interventions: o Discussed low purine diet to prevent gout flares . Patient self care activities - Over the next 180 days, patient will: o Avoid foods high in purines (red meat, seafood, alcohol)  Medication management . Pharmacist Clinical Goal(s): o Over the next 180 days, patient will work with PharmD and providers to maintain optimal medication adherence . Current pharmacy: [facility] . Interventions o Comprehensive medication review performed. o Switch to [facility] mail order pharmacy for cost savings . Patient self care activities - Over the next 180 days, patient will: o Focus on medication adherence by pill count o Take medications as prescribed o Report any questions or concerns to PharmD and/or provider(s)  Please see past updates related to this goal by clicking on the "Past Updates" button in the selected goal         Hypertension   BP goal < 140/90  Office blood pressures are  BP Readings from Last 3 Encounters:  06/19/20 138/86  04/24/20 (!) 162/64  10/17/19 (!) 165/71   Lab Results  Component Value Date   CREATININE 1.63 (H) 04/24/2020   BUN 45 (H) 04/24/2020   GFR 36.90 (L) 04/24/2020   GFRNONAA 54 (L) 01/07/2013   GFRAA 63 (L) 01/07/2013   NA 134 (L) 04/24/2020   K 4.1 04/24/2020   CALCIUM 9.6 04/24/2020   CO2 21 04/24/2020   Patient has failed these meds in the past: valsartan, chlorthalidone, HCTZ, metoprolol, losartan Patient is currently controlled on the following  medications:   Diltiazem 098 mg qAM  Bystolic 20 mg daily  Hydralazine 50 mg TID    Patient checks BP at home not checking - battery ran out  Patient home BP readings are ranging: n/a  We discussed: BP goals; benefits of medications; pt denies side effects  Plan   Continue current medications   Hyperlipidemia   LDL goal < 100  Lipid Panel     Component Value Date/Time   CHOL 134 10/10/2019 1443   TRIG 36.0 10/10/2019 1443   HDL 71.60 10/10/2019 1443   CHOLHDL 2 10/10/2019 1443   VLDL 7.2 10/10/2019 1443   LDLCALC 55 10/10/2019 1443   Hepatic Function Latest Ref Rng & Units 10/10/2019 09/21/2018 05/21/2017  Total Protein 6.0 - 8.3 g/dL 7.6 7.9 7.4  Albumin 3.5 - 5.2 g/dL 4.5 4.6 4.4  AST 0 - 37 U/L 16 14 13   ALT 0 - 35 U/L 16 11 12   Alk Phosphatase 39 - 117 U/L 78 84 73  Total Bilirubin 0.2 - 1.2 mg/dL 0.4 0.3 0.3  Bilirubin, Direct 0.0 -  0.3 mg/dL 0.0 - -   The 10-year ASCVD risk score Mikey Bussing DC Jr., et al., 2013) is: 33.4%   Values used to calculate the score:     Age: 64 years     Sex: Female     Is Non-Hispanic African American: Yes     Diabetic: Yes     Tobacco smoker: No     Systolic Blood Pressure: 432 mmHg     Is BP treated: Yes     HDL Cholesterol: 71.6 mg/dL     Total Cholesterol: 134 mg/dL   Patient has failed these meds in past: n/a Patient is currently controlled on the following medications:   Atorvastatin 10 mg daily  We discussed:  diet and exercise extensively; cholesterol goals and benefits of statin for CV risk reduction  Plan  Continue current medications and control with diet and exercise  Gout    Ref. Range 09/21/2018 15:14 10/10/2019 14:43  Uric Acid, Serum Latest Ref Range: 2.4 - 7.0 mg/dL 9.7 (H) 9.1 (H)   Patient has failed these meds in past: allopurinol Patient is currently uncontrolled on the following medications:  . Colchicine 0.6 mg daily as neded  We discussed:  Pt is following a low-purine diet and has not had any  gout flares; she reports her kidney doctor told her to avoid allopurinol.  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    Plan  Continue to manage with diet  Insomnia/Anxiety   Patient has failed these meds in past: n/a Patient is currently controlled on the following medications:   Zolpidem 5 mg HS prn  We discussed:  Pt reports 5 mg dose does not help at all; she often takes 2 to be able to sleep; she reports most of her sleep difficulties come from back pain; she takes 2 Tylenol Extra Strength at bedtime and this is often enough to help her sleep; she is interested in getting new rx for zolpidem 10 mg; discussed this dose was previously reduced due to Beers criteria and dangers of >5 mg in women over 95. Pt is adamant that she has never had issues with 10 mg dose. Advised she discuss with PCP  Plan  Continue current medications  Vaccines   Reviewed and discussed patient's vaccination history.    Immunization History  Administered Date(s) Administered  . Fluad Quad(high Dose 65+) 08/01/2019  . Influenza Split 09/08/2011  . Influenza, High Dose Seasonal PF 10/15/2017, 09/21/2018  . Influenza, Seasonal, Injecte, Preservative Fre 12/08/2012  . Influenza,inj,Quad PF,6+ Mos 08/09/2013, 08/14/2014, 01/24/2016, 07/22/2016  . Moderna SARS-COVID-2 Vaccination 01/13/2020, 02/10/2020  . Pneumococcal Conjugate-13 12/18/2014  . Pneumococcal Polysaccharide-23 08/09/2013, 10/10/2019  . Tdap 05/21/2017  . Tetanus 02/09/2014  . Zoster 02/09/2014    Plan  Recommended patient receive influenza and covid booster vaccine in   Medication Management   Pt uses [facility] for all medications Uses pill box? No - keeps in vials Pt endorses 100% compliance  We discussed: [facility] is the preferred pharmacy with her insurance and she is satisfied with their  services.  Plan  Continue current medication management strategy    Follow up: 6 month phone visit  Charlene Brooke, PharmD, [facility] Clinical Pharmacist [facility] [phone]

## 2020-11-05 ENCOUNTER — Encounter: Payer: Self-pay | Admitting: Internal Medicine

## 2020-11-05 DIAGNOSIS — H59033 Cystoid macular edema following cataract surgery, bilateral: Secondary | ICD-10-CM | POA: Diagnosis not present

## 2020-11-05 DIAGNOSIS — H401131 Primary open-angle glaucoma, bilateral, mild stage: Secondary | ICD-10-CM | POA: Diagnosis not present

## 2020-11-05 DIAGNOSIS — H2013 Chronic iridocyclitis, bilateral: Secondary | ICD-10-CM | POA: Diagnosis not present

## 2020-11-05 LAB — HM DIABETES EYE EXAM

## 2020-11-14 DIAGNOSIS — Z6825 Body mass index (BMI) 25.0-25.9, adult: Secondary | ICD-10-CM | POA: Diagnosis not present

## 2020-11-14 DIAGNOSIS — Z01419 Encounter for gynecological examination (general) (routine) without abnormal findings: Secondary | ICD-10-CM | POA: Diagnosis not present

## 2020-11-19 ENCOUNTER — Encounter (INDEPENDENT_AMBULATORY_CARE_PROVIDER_SITE_OTHER): Payer: Self-pay

## 2020-11-19 ENCOUNTER — Encounter (INDEPENDENT_AMBULATORY_CARE_PROVIDER_SITE_OTHER): Payer: Medicare Other | Admitting: Ophthalmology

## 2020-12-21 NOTE — Progress Notes (Signed)
[facility] Note  12/24/2020     CHIEF COMPLAINT Patient presents for Retina Evaluation   HISTORY OF PRESENT ILLNESS: Sarah [name] is a 80 y.o. female who presents to the clinic today for:   HPI    Retina Evaluation    In both eyes.  This started years ago.  Duration of years.  Associated Symptoms Redness (Tearing).  Context:  distance vision, mid-range vision and near vision.  Treatments tried include eye drops.  Response to treatment was no improvement.  I, the attending physician,  performed the HPI with the patient and updated documentation appropriately.          Comments    80 y/o female pt referred by Dr. 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  Pt has had CME OU s/p cat sx OU in late 2018.  VA fair OU [place], but blurred (worse OS).  Denies pain, FOL, floaters, but eyes water a lot, especially outdoors.  Dorzolamide/Timolol TID OU [place] P TID OU Rocklatan QHS OU Pred BID OU       Last edited by [name], MD on 12/24/2020  1:03 PM. (History)    pt is here on the referral of Dr. Kathlen Mody for concern of CME OU, pt states Dr. Kathlen Mody did her cataract sx and she has been having swelling and problems with her pressure since sx, she is on PF BID OU as well as several drops for IOP, pt states she can see pretty well at a distance, but has to use reading glasses for near, every once in a while may have eye pain associated with light  Referring physician: Hortencia Pilar, MD [place],  [place] [address]  HISTORICAL INFORMATION:   Selected notes from the MEDICAL RECORD NUMBER Referred by Dr. Kathlen Mody for chronic uveitis/[place] [place] [facility]   CURRENT MEDICATIONS: Current Outpatient Medications (Ophthalmic Drugs)  Medication Sig  . brimonidine (ALPHAGAN) 0.2 % ophthalmic solution 1 drop 3 (three) times daily.  . Bromfenac Sodium (PROLENSA) 0.07 % SOLN Place 1 drop into both eyes 4 (four) times daily.  . dorzolamide  (TRUSOPT) 2 % ophthalmic solution INSTILL 1 DROP INTO OU BID  . dorzolamide-timolol (COSOPT) 22.3-6.8 MG/ML ophthalmic solution INT 1 DROP IN OU BID  . prednisoLONE acetate (PRED FORTE) 1 % ophthalmic suspension 1 drop 2 (two) times daily.  . prednisoLONE acetate (PRED MILD) 0.12 % ophthalmic suspension Place 1 drop into both eyes 4 (four) times daily.   No current facility-administered medications for this visit. (Ophthalmic Drugs)   Current Outpatient Medications (Other)  Medication Sig  . acetaminophen (TYLENOL) 500 MG tablet Take 500 mg by mouth every 6 (six) hours as needed.  Marland Kitchen atorvastatin (LIPITOR) 10 MG tablet TAKE 1 TABLET(10 MG) BY MOUTH DAILY  . Colchicine (MITIGARE) 0.6 MG CAPS Take 1 tablet by mouth daily.  Marland Kitchen diltiazem (TIAZAC) 300 MG 24 hr capsule TAKE 1 CAPSULE BY MOUTH DAILY BEFORE BREAKFAST  . hydrALAZINE (APRESOLINE) 100 MG tablet Take 50 mg by mouth 3 (three) times daily.   . hydrALAZINE (APRESOLINE) 50 MG tablet Take 50 mg by mouth 3 (three) times daily.  Marland Kitchen losartan (COZAAR) 25 MG tablet Take 12.5 mg by mouth daily.  . Nebivolol HCl (BYSTOLIC) 20 MG TABS Take 1 tablet (20 mg total) by mouth daily.  . SYMBICORT 160-4.5 MCG/ACT inhaler INHALE 2 PUFFS INTO THE LUNGS TWICE DAILY  . zolpidem (AMBIEN) 5 MG tablet TAKE 1 TABLET(5 MG)  BY MOUTH AT BEDTIME AS NEEDED FOR SLEEP   No current facility-administered medications for this visit. (Other)      REVIEW OF SYSTEMS: ROS    Positive for: Neurological, Musculoskeletal, Eyes, Respiratory   Negative for: Constitutional, Gastrointestinal, Skin, Genitourinary, HENT, Endocrine, Cardiovascular, Psychiatric, Allergic/Imm, Heme/Lymph   Last edited by Matthew Folks, COA on 12/24/2020 12:53 PM. (History)       ALLERGIES Allergies  Allergen Reactions  . Lisinopril     REACTION: Lightheaded "sick"    PAST MEDICAL HISTORY Past Medical History:  Diagnosis Date  . Allergic rhinitis   . Arthritis   . Chronic back pain   .  Contact dermatitis and other eczema, due to unspecified cause   . Diabetes mellitus without complication ([place])   . Dysrhythmia    hx of heart beating fast   . Glaucoma    POAG OU  . History of sinusitis   . Hx of adenomatous colonic polyps 10/17/2014  . Hypertension   . Hypertensive retinopathy    OU  . Peripheral vascular disease ([facility])    hx of DVT - left leg   . Personal history of unspecified circulatory disease   . Plantar fascial fibromatosis   . Shortness of breath    with exertion    Past Surgical History:  Procedure Laterality Date  . ABDOMINAL HYSTERECTOMY    . CATARACT EXTRACTION Bilateral 2018  . EYE SURGERY Bilateral 2018   Cat Sx  . FLEXIBLE SIGMOIDOSCOPY    . FOOT SURGERY Bilateral   . History of Lumbar fusion     '87  . SHOULDER ARTHROSCOPY WITH ROTATOR CUFF REPAIR AND SUBACROMIAL DECOMPRESSION Left 01/14/2013   Procedure: Left Shoulder Arthroscopy with Debridement, Subacromial Decompression,rotator cuff repair;  Surgeon: Mcarthur Rossetti, MD;  Location: [facility];  Service: Orthopedics;  Laterality: Left;  Left Shoulder Arthroscopy with Debridement, Subacromial Decompression    FAMILY HISTORY Family History  Problem Relation Age of Onset  . Leukemia Mother   . Brain cancer Father        mets  . Throat cancer Brother        x 2 brothers    SOCIAL HISTORY Social History   Tobacco Use  . Smoking status: Never Smoker  . Smokeless tobacco: Never Used  Vaping Use  . Vaping Use: Never used  Substance Use Topics  . Alcohol use: No  . Drug use: No         OPHTHALMIC EXAM:  Base Eye Exam    Visual Acuity (Snellen - Linear)      Right Left   Dist [place] 20/30 -2 20/60 -2   Dist ph [place] NI NI       Tonometry (Tonopen, 12:59 PM)      Right Left   Pressure 12 13       Pupils      Dark Light Shape React APD   Right 3 2 Round Minimal None   Left 3 2 Round Minimal None       Visual Fields (Counting fingers)      Left Right    Full Full        Extraocular Movement      Right Left    Full, Ortho Full, Ortho       Neuro/Psych    Oriented x3: Yes   Mood/Affect: Normal       Dilation    Both eyes: 1.0% Mydriacyl, 2.5% Phenylephrine @ 12:59 PM  Slit Lamp and Fundus Exam    Slit Lamp Exam      Right Left   Lids/Lashes Dermatochalasis - upper lid Dermatochalasis - upper lid   Conjunctiva/Sclera Melanosis, 2+ Injection Melanosis, 2+ Injection   Cornea arcus, well healed temporal cataract wounds, trace Punctate epithelial erosions arcus, well healed temporal cataract wounds, trace Punctate epithelial erosions   Anterior Chamber Deep and quiet Deep and quiet   Iris Round and dilated, mild atrophy Round and dilated, mild atrophy   Lens PC IOL in good position PC IOL in good position   Vitreous Vitreous syneresis, +pigment, mild vitreous condensations Vitreous syneresis, fine pigment, vitreous condensations       Fundus Exam      Right Left   Disc Pink and Sharp Pink and Sharp, Compact, temporal PPA   C/D Ratio 0.5 0.4   Macula Flat, Blunted foveal reflex, RPE mottling, trace cystic changes Flat, Blunted foveal reflex, central cystic changes, no heme   Vessels attenuated, Tortuous attenuated, mild tortuousity   Periphery Attached, no heme    Attached, trace peripheral MA           Refraction    Manifest Refraction      Sphere Cylinder Dist VA   Right +0.25 Sphere 20/30   Left -0.50 Sphere 20/50          IMAGING AND PROCEDURES  Imaging and Procedures for 12/24/2020  OCT, Retina - OU - Both Eyes       Right Eye Quality was good. Central Foveal Thickness: 309. Progression has no prior data. Findings include abnormal foveal contour, intraretinal fluid, no SRF, myopic contour (Mild perifoveal cystic changes and edema greatest nasally; mild vitreous opacities).   Left Eye Quality was good. Central Foveal Thickness: 330. Progression has no prior data. Findings include abnormal foveal contour, intraretinal  fluid, no SRF, myopic contour (Central IRF/CME; mild vitreous opacities).   Notes *Images captured and stored on drive  Diagnosis / Impression:  +CME OU (OS > OD)  Clinical management:  See below  Abbreviations: NFP - Normal foveal profile. CME - cystoid macular edema. PED - pigment epithelial detachment. IRF - intraretinal fluid. SRF - subretinal fluid. EZ - ellipsoid zone. ERM - epiretinal membrane. ORA - outer retinal atrophy. ORT - outer retinal tubulation. SRHM - subretinal hyper-reflective material. IRHM - intraretinal hyper-reflective material        Fluorescein Angiography Optos (Transit OS)       Right Eye   Progression has no prior data. Early phase findings include staining, microaneurysm (Hyperfluorescence of disc, focal peripheral hyperfluorescence -- peripheral MA). Mid/Late phase findings include staining, leakage, microaneurysm (Mild perifoveal leakage).   Left Eye   Progression has no prior data. Early phase findings include staining, microaneurysm (Hyperfluorescence of disc, focal peripheral hyperfluorescence -- peripheral MA). Mid/Late phase findings include staining, leakage, microaneurysm (Mild perifoveal leakage).   Notes **Images stored on drive**  Impression: Hyperfluorescence of disc, focal peripheral hyperfluorescence -- peripheral MA OU Mild perifoveal leakage OU [facility] CME OU                  ASSESSMENT/PLAN:    ICD-10-CM   1. 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 Retinal edema  H35.81 OCT, Retina - OU - Both Eyes  4. Essential hypertension  I10   5. Hypertensive retinopathy of both eyes  H35.033 Fluorescein Angiography Optos (Transit OS)  6. Pseudophakia, both eyes  Z96.1  7. Primary open angle glaucoma (POAG) of both eyes, mild stage  H40.1131     1-3. CME OU - [facility] / posterior uveitis OU  - pt with history of chronic CME post cataract surgery OU in 2018  -  history of improvement on topical steroid and NSAID, but recurrence w/ tapering; also history of steroid response  - currently on Prednisolone BID OU w/ IOP 12,13  - BCVA 20/30 OD, 20/60 OS  - OCT shows IRF/CME OU (OS > OD)  - FA 01.24.22 shows hyperfluorescence of disc and perifoveal hyperfluorescence OU consistent with [facility] CME; also shows focal peripheral hyperfluorescence OU consistent with posterior uveitis / inflammatory component  - discussed findings, prognosis and treatment options  - recommend re-initiation of topical therapy to start  - currently on PF BID OU -- will increase to TID OU  - add Prolensa QID OU  - f/u 3-4 weeks, DFE, OCT  4,5. Hypertensive retinopathy OU - discussed importance of tight BP control - monitor  6. Pseudophakia OU  - s/p CE/IOL OU (2018)  - IOLs in good position  - chronic CME as above  - monitor  7. POAG OU  - under the expert manage of Dr. Kathlen Mody  - mild stage, s/p SLT OU x2, s/p goniotomy OU  - history of steroid response  - IOP 12,13 today  - on Rocklatan qhs OU, Cosopt and Brim TID OU   Ophthalmic Meds Ordered this visit:  Meds ordered this encounter  Medications  . Bromfenac Sodium (PROLENSA) 0.07 % SOLN    Sig: Place 1 drop into both eyes 4 (four) times daily.    Dispense:  6 mL    Refill:  3      Return in about 3 weeks (around 01/14/2021) for f/u 3 weeks, CME OU, DFE, OCT.  There are no Patient Instructions on file for this visit.  Explained the diagnoses, plan, and follow up with the patient and they expressed understanding.  Patient expressed understanding of the importance of proper follow up care.   This document serves as a record of services personally performed by Gardiner Sleeper, MD, PhD. It was created on their behalf by Estill Bakes, COT an ophthalmic technician. The creation of this record is the provider's dictation and/or activities during the visit.    Electronically signed by: Estill Bakes, COT 1.21.22  @ 11:11 PM   This document serves as a record of services personally performed by Gardiner Sleeper, MD, PhD. It was created on their behalf by San Jetty. Owens Shark, OA an ophthalmic technician. The creation of this record is the provider's dictation and/or activities during the visit.    Electronically signed by: San Jetty. [place], [place] 01.24.2022 11:11 PM   Gardiner Sleeper, M.D., Ph.D. Diseases & Surgery of the Retina and Vitreous [facility]  I have reviewed the above documentation for accuracy and completeness, and I agree with the above. Gardiner Sleeper, M.D., Ph.D. 12/24/20 11:11 PM   Abbreviations: M myopia (nearsighted); A astigmatism; H hyperopia (farsighted); P presbyopia; Mrx spectacle prescription;  CTL contact lenses; OD right eye; OS left eye; OU both eyes  XT exotropia; ET esotropia; PEK punctate epithelial keratitis; PEE punctate epithelial erosions; DES dry eye syndrome; MGD meibomian gland dysfunction; ATs artificial tears; PFAT's preservative free artificial tears; [place] nuclear sclerotic cataract; PSC posterior subcapsular cataract; ERM epi-retinal membrane; PVD posterior vitreous detachment; RD retinal detachment; DM diabetes mellitus; DR diabetic retinopathy; NPDR non-proliferative diabetic retinopathy;  PDR proliferative diabetic retinopathy; CSME clinically significant macular edema; DME diabetic macular edema; dbh dot blot hemorrhages; CWS cotton wool spot; POAG primary open angle glaucoma; C/D cup-to-disc ratio; HVF humphrey visual field; GVF goldmann visual field; OCT optical coherence tomography; IOP intraocular pressure; BRVO Branch retinal vein occlusion; CRVO central retinal vein occlusion; CRAO central retinal artery occlusion; BRAO branch retinal artery occlusion; RT retinal tear; SB scleral buckle; PPV pars plana vitrectomy; VH Vitreous hemorrhage; PRP panretinal laser photocoagulation; IVK intravitreal kenalog; VMT vitreomacular traction; MH Macular hole;   NVD neovascularization of the disc; NVE neovascularization elsewhere; AREDS age related eye disease study; ARMD age related macular degeneration; POAG primary open angle glaucoma; EBMD epithelial/anterior basement membrane dystrophy; ACIOL anterior chamber intraocular lens; IOL intraocular lens; PCIOL posterior chamber intraocular lens; Phaco/IOL phacoemulsification with intraocular lens placement; [place] photorefractive keratectomy; LASIK laser assisted in situ keratomileusis; HTN hypertension; DM diabetes mellitus; COPD chronic obstructive pulmonary disease

## 2020-12-24 ENCOUNTER — Ambulatory Visit (INDEPENDENT_AMBULATORY_CARE_PROVIDER_SITE_OTHER): Payer: Medicare Other | Admitting: Ophthalmology

## 2020-12-24 ENCOUNTER — Encounter (INDEPENDENT_AMBULATORY_CARE_PROVIDER_SITE_OTHER): Payer: Self-pay | Admitting: Ophthalmology

## 2020-12-24 ENCOUNTER — Other Ambulatory Visit: Payer: Self-pay

## 2020-12-24 DIAGNOSIS — H3093 Unspecified chorioretinal inflammation, bilateral: Secondary | ICD-10-CM

## 2020-12-24 DIAGNOSIS — H35353 Cystoid macular degeneration, bilateral: Secondary | ICD-10-CM

## 2020-12-24 DIAGNOSIS — H3581 Retinal edema: Secondary | ICD-10-CM

## 2020-12-24 DIAGNOSIS — H35033 Hypertensive retinopathy, bilateral: Secondary | ICD-10-CM

## 2020-12-24 DIAGNOSIS — I10 Essential (primary) hypertension: Secondary | ICD-10-CM | POA: Diagnosis not present

## 2020-12-24 DIAGNOSIS — H401131 Primary open-angle glaucoma, bilateral, mild stage: Secondary | ICD-10-CM

## 2020-12-24 DIAGNOSIS — Z961 Presence of intraocular lens: Secondary | ICD-10-CM

## 2020-12-24 MED ORDER — PROLENSA 0.07 % OP SOLN: 1.0000 [drp] | Freq: Four times a day (QID) | OPHTHALMIC | 3 refills | Status: DC

## 2021-01-08 ENCOUNTER — Other Ambulatory Visit: Payer: Self-pay | Admitting: Internal Medicine

## 2021-01-08 DIAGNOSIS — I10 Essential (primary) hypertension: Secondary | ICD-10-CM

## 2021-01-08 NOTE — Progress Notes (Signed)
[facility] Note  01/14/2021     CHIEF COMPLAINT Patient presents for Retina Follow Up   HISTORY OF PRESENT ILLNESS: Sarah [name] is a 80 y.o. female who presents to the clinic today for:   HPI    Retina Follow Up    Patient presents with  Other.  In both eyes.  This started months ago.  Severity is moderate.  Duration of months.  Since onset it is stable.  I, the attending physician,  performed the HPI with the patient and updated documentation appropriately.          Comments    Pt states vision is the same OU.  Pt complains of pain OS--started a couple of days ago and is intermittent.   Pt is currently using: Prolensa TID OU PF TID OU Cosopt TID OU Brimonidine TID OU Rocklatan QHS OU       Last edited by [name], MD on 01/14/2021 11:30 PM. 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 Kathlen Mody for chronic uveitis/[place]   CURRENT MEDICATIONS: Current Outpatient Medications (Ophthalmic Drugs)  Medication Sig  . Loteprednol Etabonate (LOTEMAX SM) 0.38 % GEL Place 1 drop into both eyes 3 (three) times daily.  . brimonidine (ALPHAGAN) 0.2 % ophthalmic solution 1 drop 3 (three) times daily.  . Bromfenac Sodium (PROLENSA) 0.07 % SOLN Place 1 drop into both eyes 3 (three) times daily.  . dorzolamide (TRUSOPT) 2 % ophthalmic solution INSTILL 1 DROP INTO OU BID  . dorzolamide-timolol (COSOPT) 22.3-6.8 MG/ML ophthalmic solution INT 1 DROP IN OU BID  . prednisoLONE acetate (PRED FORTE) 1 % ophthalmic suspension 1 drop 2 (two) times daily.  . prednisoLONE acetate (PRED MILD) 0.12 % ophthalmic suspension Place 1 drop into both eyes 4 (four) times daily.   No current facility-administered medications for this visit. (Ophthalmic Drugs)   Current Outpatient Medications (Other)  Medication Sig   . acetaminophen (TYLENOL) 500 MG tablet Take 500 mg by mouth every 6 (six) hours as needed.  Marland Kitchen atorvastatin (LIPITOR) 10 MG tablet TAKE 1 TABLET(10 MG) BY MOUTH DAILY  . Colchicine (MITIGARE) 0.6 MG CAPS Take 1 tablet by mouth daily.  Marland Kitchen diltiazem (TIAZAC) 300 MG 24 hr capsule TAKE 1 CAPSULE BY MOUTH DAILY BEFORE BREAKFAST  . hydrALAZINE (APRESOLINE) 100 MG tablet Take 50 mg by mouth 3 (three) times daily.   . hydrALAZINE (APRESOLINE) 50 MG tablet Take 50 mg by mouth 3 (three) times daily.  Marland Kitchen losartan (COZAAR) 25 MG tablet Take 12.5 mg by mouth daily.  . Nebivolol HCl 20 MG TABS TAKE 1 TABLET(20 MG) BY MOUTH DAILY  . SYMBICORT 160-4.5 MCG/ACT inhaler INHALE 2 PUFFS INTO THE LUNGS TWICE DAILY  . zolpidem (AMBIEN) 5 MG tablet TAKE 1 TABLET(5 MG) BY MOUTH AT BEDTIME AS NEEDED FOR SLEEP   No current facility-administered medications for this visit. (Other)      REVIEW OF SYSTEMS: ROS    Positive for: Neurological, Musculoskeletal, Eyes, Respiratory   Negative for: Constitutional, Gastrointestinal, Skin, Genitourinary, HENT, Endocrine, Cardiovascular, Psychiatric, Allergic/Imm, Heme/Lymph   Last edited by [name] on 01/14/2021  1:23 PM. (History)       ALLERGIES Allergies  Allergen Reactions  . Lisinopril     REACTION: Lightheaded "sick"    PAST MEDICAL HISTORY Past Medical  History:  Diagnosis Date  . Allergic rhinitis   . Arthritis   . Chronic back pain   . Contact dermatitis and other eczema, due to unspecified cause   . Diabetes mellitus without complication ([place])   . Dysrhythmia    hx of heart beating fast   . Glaucoma    POAG OU  . History of sinusitis   . Hx of adenomatous colonic polyps 10/17/2014  . Hypertension   . Hypertensive retinopathy    OU  . Peripheral vascular disease (HCC)    hx of DVT - left leg   . Personal history of unspecified circulatory disease   . Plantar fascial fibromatosis   . Shortness of breath    with exertion    Past  Surgical History:  Procedure Laterality Date  . ABDOMINAL HYSTERECTOMY    . CATARACT EXTRACTION Bilateral 2018  . EYE SURGERY Bilateral 2018   Cat Sx  . FLEXIBLE SIGMOIDOSCOPY    . FOOT SURGERY Bilateral   . History of Lumbar fusion     '87  . SHOULDER ARTHROSCOPY WITH ROTATOR CUFF REPAIR AND SUBACROMIAL DECOMPRESSION Left 01/14/2013   Procedure: Left Shoulder Arthroscopy with Debridement, Subacromial Decompression,rotator cuff repair;  Surgeon: Mcarthur Rossetti, MD;  Location: [facility];  Service: Orthopedics;  Laterality: Left;  Left Shoulder Arthroscopy with Debridement, Subacromial Decompression    FAMILY HISTORY Family History  Problem Relation Age of Onset  . Leukemia Mother   . Brain cancer Father        mets  . Throat cancer Brother        x 2 brothers    SOCIAL HISTORY Social History   Tobacco Use  . Smoking status: Never Smoker  . Smokeless tobacco: Never Used  Vaping Use  . Vaping Use: Never used  Substance Use Topics  . Alcohol use: No  . Drug use: No         OPHTHALMIC EXAM:  Base Eye Exam    Visual Acuity (Snellen - Linear)      Right Left   Dist [place] 20/25 -2 20/70 +1   Dist ph [place] 20/40 -2       Tonometry (Tonopen, 1:31 PM)      Right Left   Pressure 23 23       Pupils      Dark Light Shape React APD   Right 3 2 Round Minimal 0   Left 3 2 Round Minimal 0       Visual Fields      Left Right    Full Full       Extraocular Movement      Right Left    Full Full       Neuro/Psych    Oriented x3: Yes   Mood/Affect: Normal       Dilation    Both eyes: 1.0% Mydriacyl, 2.5% Phenylephrine @ 1:31 PM        Slit Lamp and Fundus Exam    Slit Lamp Exam      Right Left   Lids/Lashes Dermatochalasis - upper lid Dermatochalasis - upper lid   Conjunctiva/Sclera Melanosis, 2+ Injection Melanosis, 2+ Injection   Cornea arcus, well healed temporal cataract wounds, 2+Punctate epithelial erosions arcus, well healed temporal cataract  wounds, 1+ inferior Punctate epithelial erosions   Anterior Chamber Deep and quiet Deep and quiet   Iris Round and dilated, mild atrophy Round and dilated, mild atrophy   Lens PC IOL in good position PC  IOL in good position   Vitreous Vitreous syneresis, +pigment, mild vitreous condensations Vitreous syneresis, fine pigment, vitreous condensations       Fundus Exam      Right Left   Disc Pink and Sharp, PPA/PPP Pink and Sharp, Compact, temporal PPA   C/D Ratio 0.5 0.4   Macula Flat, Blunted foveal reflex, RPE mottling, trace cystic changes - improved Flat, Blunted foveal reflex, central cystic changes, RPE mottling, no heme   Vessels attenuated, Tortuous attenuated, mild tortuousity   Periphery Attached, no heme    Attached, trace peripheral MA             IMAGING AND PROCEDURES  Imaging and Procedures for 01/14/2021  OCT, Retina - OU - Both Eyes       Right Eye Quality was good. Central Foveal Thickness: 313. Progression has improved. Findings include abnormal foveal contour, intraretinal fluid, no SRF, myopic contour (Mild interval improvement in IRF).   Left Eye Quality was good. Central Foveal Thickness: 337. Progression has been stable. Findings include abnormal foveal contour, intraretinal fluid, no SRF, myopic contour (Persistent IRF).   Notes *Images captured and stored on drive  Diagnosis / Impression:  +CME OU (OS > OD) OD: Mild interval improvement in IRF OS: Persistent IRF  Clinical management:  See below  Abbreviations: NFP - Normal foveal profile. CME - cystoid macular edema. PED - pigment epithelial detachment. IRF - intraretinal fluid. SRF - subretinal fluid. EZ - ellipsoid zone. ERM - epiretinal membrane. ORA - outer retinal atrophy. ORT - outer retinal tubulation. SRHM - subretinal hyper-reflective material. IRHM - intraretinal hyper-reflective material                 ASSESSMENT/PLAN:    ICD-10-CM   1. CME (cystoid macular edema), bilateral   H35.353   2. 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 Retinal edema  H35.81 OCT, Retina - OU - Both Eyes  4. Essential hypertension  I10   5. Hypertensive retinopathy of both eyes  H35.033   6. Pseudophakia, both eyes  Z96.1   7. Primary open angle glaucoma (POAG) of both eyes, mild stage  H40.1131     1-3. CME OU - [place] / posterior uveitis OU  - pt with history of chronic CME post cataract surgery OU in 2018  - history of improvement on topical steroid and NSAID, but recurrence w/ tapering; also history of steroid response  - currently on Prednisolone and Prolensa TID OU w/ IOP inc to 23 OU  - BCVA 20/25 OD, 20/40 OS (both improved)  - OCT shows OD: Mild interval improvement in IRF; OS: Persistent IRF  - FA 01.24.22 shows hyperfluorescence of disc and perifoveal hyperfluorescence OU consistent with [place] CME; also shows focal peripheral hyperfluorescence OU consistent with posterior uveitis / inflammatory component  - discussed findings, prognosis and treatment options  - currently on PF TID OU -- will switch to Lotemax SM TID  - continue Prolensa TID OU  - f/u 3 weeks, DFE, OCT -- STK?  4,5. Hypertensive retinopathy OU - discussed importance of tight BP control - monitor  6. Pseudophakia OU  - s/p CE/IOL OU (2018)  - IOLs in good position  - chronic CME as above  - monitor  7. POAG OU  - under the expert manage of Dr. Kathlen Mody  - mild stage, s/p SLT OU x2, s/p goniotomy OU  - history of steroid response  - IOP 23 OU today  - on Rocklatan qhs OU,  Cosopt and Brim TID OU   Ophthalmic Meds Ordered this visit:  Meds ordered this encounter  Medications  . Loteprednol Etabonate (LOTEMAX SM) 0.38 % GEL    Sig: Place 1 drop into both eyes 3 (three) times daily.    Dispense:  5 g    Refill:  5  . Bromfenac Sodium (PROLENSA) 0.07 % SOLN    Sig: Place 1 drop into both eyes 3 (three) times daily.    Dispense:  6 mL    Refill:  3      Return in about 3 weeks  (around 02/04/2021) for f/u CME OU, DFE, OCT.  There are no Patient Instructions on file for this visit.  This document serves as a record of services personally performed by Gardiner Sleeper, MD, PhD. It was created on their behalf by Leeann Must, [place], an ophthalmic technician. The creation of this record is the provider's dictation and/or activities during the visit.    Electronically signed by: Leeann Must, COA @TODAY @ 11:34 PM   This document serves as a record of services personally performed by Gardiner Sleeper, MD, PhD. It was created on their behalf by San Jetty. Owens Shark, OA an ophthalmic technician. The creation of this record is the provider's dictation and/or activities during the visit.    Electronically signed by: San Jetty. [place], [place] 02.14.2022 11:34 PM  Gardiner Sleeper, M.D., Ph.D. Diseases & Surgery of the Retina and [place] 01/14/2021   I have reviewed the above documentation for accuracy and completeness, and I agree with the above. Gardiner Sleeper, M.D., Ph.D. 01/14/21 11:34 PM   Abbreviations: M myopia (nearsighted); A astigmatism; H hyperopia (farsighted); P presbyopia; Mrx spectacle prescription;  CTL contact lenses; OD right eye; OS left eye; OU both eyes  XT exotropia; ET esotropia; PEK punctate epithelial keratitis; PEE punctate epithelial erosions; DES dry eye syndrome; MGD meibomian gland dysfunction; ATs artificial tears; PFAT's preservative free artificial tears; [place] nuclear sclerotic cataract; PSC posterior subcapsular cataract; ERM epi-retinal membrane; PVD posterior vitreous detachment; RD retinal detachment; DM diabetes mellitus; DR diabetic retinopathy; NPDR non-proliferative diabetic retinopathy; PDR proliferative diabetic retinopathy; CSME clinically significant macular edema; DME diabetic macular edema; dbh dot blot hemorrhages; CWS cotton wool spot; POAG primary open angle glaucoma; C/D cup-to-disc ratio; HVF humphrey visual  field; GVF goldmann visual field; OCT optical coherence tomography; IOP intraocular pressure; BRVO Branch retinal vein occlusion; CRVO central retinal vein occlusion; CRAO central retinal artery occlusion; BRAO branch retinal artery occlusion; RT retinal tear; SB scleral buckle; PPV pars plana vitrectomy; VH Vitreous hemorrhage; PRP panretinal laser photocoagulation; IVK intravitreal kenalog; VMT vitreomacular traction; MH Macular hole;  NVD neovascularization of the disc; NVE neovascularization elsewhere; AREDS age related eye disease study; ARMD age related macular degeneration; POAG primary open angle glaucoma; EBMD epithelial/anterior basement membrane dystrophy; ACIOL anterior chamber intraocular lens; IOL intraocular lens; PCIOL posterior chamber intraocular lens; Phaco/IOL phacoemulsification with intraocular lens placement; [place] photorefractive keratectomy; LASIK laser assisted in situ keratomileusis; HTN hypertension; DM diabetes mellitus; COPD chronic obstructive pulmonary disease

## 2021-01-09 ENCOUNTER — Telehealth: Payer: Self-pay | Admitting: Pharmacist

## 2021-01-09 NOTE — Progress Notes (Addendum)
Chronic Care Management Pharmacy Assistant   Name: Sarah [name]  MRN: [id number] DOB: 1941/10/30  Reason for Encounter: Disease State   PCP : [name], MD  Allergies:   Allergies  Allergen Reactions   Lisinopril     REACTION: Lightheaded "sick"    Medications: Outpatient Encounter Medications as of 01/09/2021  Medication Sig   acetaminophen (TYLENOL) 500 MG tablet Take 500 mg by mouth every 6 (six) hours as needed.   atorvastatin (LIPITOR) 10 MG tablet TAKE 1 TABLET(10 MG) BY MOUTH DAILY   brimonidine (ALPHAGAN) 0.2 % ophthalmic solution 1 drop 3 (three) times daily.   Bromfenac Sodium (PROLENSA) 0.07 % SOLN Place 1 drop into both eyes 4 (four) times daily.   Colchicine (MITIGARE) 0.6 MG CAPS Take 1 tablet by mouth daily.   diltiazem (TIAZAC) 300 MG 24 hr capsule TAKE 1 CAPSULE BY MOUTH DAILY BEFORE BREAKFAST   dorzolamide (TRUSOPT) 2 % ophthalmic solution INSTILL 1 DROP INTO OU BID   dorzolamide-timolol (COSOPT) 22.3-6.8 MG/ML ophthalmic solution INT 1 DROP IN OU BID   hydrALAZINE (APRESOLINE) 100 MG tablet Take 50 mg by mouth 3 (three) times daily.    hydrALAZINE (APRESOLINE) 50 MG tablet Take 50 mg by mouth 3 (three) times daily.   losartan (COZAAR) 25 MG tablet Take 12.5 mg by mouth daily.   Nebivolol HCl 20 MG TABS TAKE 1 TABLET(20 MG) BY MOUTH DAILY   prednisoLONE acetate (PRED FORTE) 1 % ophthalmic suspension 1 drop 2 (two) times daily.   prednisoLONE acetate (PRED MILD) 0.12 % ophthalmic suspension Place 1 drop into both eyes 4 (four) times daily.   SYMBICORT 160-4.5 MCG/ACT inhaler INHALE 2 PUFFS INTO THE LUNGS TWICE DAILY   zolpidem (AMBIEN) 5 MG tablet TAKE 1 TABLET(5 MG) BY MOUTH AT BEDTIME AS NEEDED FOR SLEEP   [DISCONTINUED] valsartan (DIOVAN) 160 MG tablet Take 1 tablet (160 mg total) by mouth daily.   No facility-administered encounter medications on file as of 01/09/2021.    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 06/19/2020   Anemia, chronic disease 10/10/2019   Plantar fasciitis, left 10/10/2019   Osteoarthritis of both feet 10/06/2018   CRI (chronic renal insufficiency), stage 3 (moderate) 09/21/2018   Vitamin D deficiency disease 09/21/2018   OAB (overactive bladder) 10/15/2017   Insomnia due to anxiety and fear 06/01/2017   Gastroesophageal reflux disease with esophagitis 10/20/2016   Asthma in adult, moderate persistent, uncomplicated 73/41/9379   Allergic rhinitis due to pollen 04/19/2015   Type II diabetes mellitus with manifestations ([place]) 08/14/2014   Hyperlipidemia with target LDL less than 100 08/14/2014   Hyperuricemia 02/29/2012   Routine health maintenance 09/10/2011   Essential hypertension 08/20/2007    Reviewed chart prior to disease state call. Spoke with patient regarding BP  Recent Office Vitals: BP Readings from Last 3 Encounters:  06/19/20 138/86  04/24/20 (!) 162/64  10/17/19 (!) 165/71   Pulse Readings from Last 3 Encounters:  06/19/20 71  04/24/20 65  10/17/19 70    Wt Readings from Last 3 Encounters:  06/19/20 138 lb (62.6 kg)  04/24/20 142 lb 6 oz (64.6 kg)  10/10/19 143 lb 0.6 oz (64.9 kg)     Kidney Function Lab Results  Component Value Date/Time   CREATININE 1.63 (H) 04/24/2020 02:20 PM   CREATININE 1.66 (H) 10/10/2019 02:43 PM   GFR 36.90 (L) 04/24/2020 02:20 PM  GFRNONAA 54 (L) 01/07/2013 10:50 AM   GFRAA 63 (L) 01/07/2013 10:50 AM    BMP Latest Ref Rng & Units 04/24/2020 10/10/2019 01/18/2019  Glucose 70 - 99 mg/dL 109(H) 101(H) 87  BUN 6 - 23 mg/dL 45(H) 41(H) 36(H)  Creatinine 0.40 - 1.20 mg/dL 1.63(H) 1.66(H) 1.77(H)  Sodium 135 - 145 mEq/L 134(L) 139 140  Potassium 3.5 - 5.1 mEq/L 4.1 4.3 4.2  Chloride 96 - 112 mEq/L 104 109 108  CO2 19 - 32 mEq/L 21 20 20   Calcium 8.4 - 10.5 mg/dL 9.6 9.6 9.6      Current antihypertensive regimen:  Losartan 25mg  daily Bystolic 20 mg  daily How often are you checking your Blood Pressure? infrequently Current home BP readings: Patient stated her cuff broke and has been having trouble with her eyes so she can not drive at the moment and her children have been driving her everywhere in the meantime. Patient stated when she gets her new cuff she will check daily . Patient states her kidney provider also asks her to check daily BP.  What recent interventions/DTPs have been made by any provider to improve Blood Pressure control since last CPP Visit: None noted Any recent hospitalizations or ED visits since last visit with CPP? No What diet changes have been made to improve Blood Pressure Control?  Patient stated she watches what she eats. What exercise is being done to improve your Blood Pressure Control?  Patient stated she walks around the house .   Adherence Review: Is the patient currently on ACE/ARB medication? Yes - Losartan Does the patient have >5 day gap between last estimated fill dates? CPP to review  [place] [facility] Pharmacist Assistant [phone]  Follow-Up:  Pharmacist Review

## 2021-01-14 ENCOUNTER — Encounter (INDEPENDENT_AMBULATORY_CARE_PROVIDER_SITE_OTHER): Payer: Medicare Other | Admitting: Ophthalmology

## 2021-01-14 ENCOUNTER — Other Ambulatory Visit: Payer: Self-pay

## 2021-01-14 ENCOUNTER — Ambulatory Visit (INDEPENDENT_AMBULATORY_CARE_PROVIDER_SITE_OTHER): Payer: Medicare Other | Admitting: Ophthalmology

## 2021-01-14 ENCOUNTER — Encounter (INDEPENDENT_AMBULATORY_CARE_PROVIDER_SITE_OTHER): Payer: Self-pay | Admitting: Ophthalmology

## 2021-01-14 DIAGNOSIS — H3093 Unspecified chorioretinal inflammation, bilateral: Secondary | ICD-10-CM

## 2021-01-14 DIAGNOSIS — H35353 Cystoid macular degeneration, bilateral: Secondary | ICD-10-CM

## 2021-01-14 DIAGNOSIS — I10 Essential (primary) hypertension: Secondary | ICD-10-CM | POA: Diagnosis not present

## 2021-01-14 DIAGNOSIS — H3581 Retinal edema: Secondary | ICD-10-CM

## 2021-01-14 DIAGNOSIS — Z961 Presence of intraocular lens: Secondary | ICD-10-CM

## 2021-01-14 DIAGNOSIS — H401131 Primary open-angle glaucoma, bilateral, mild stage: Secondary | ICD-10-CM

## 2021-01-14 DIAGNOSIS — H35033 Hypertensive retinopathy, bilateral: Secondary | ICD-10-CM

## 2021-01-14 MED ORDER — PROLENSA 0.07 % OP SOLN: 1.0000 [drp] | Freq: Three times a day (TID) | OPHTHALMIC | 3 refills | Status: DC

## 2021-01-14 MED ORDER — LOTEMAX SM 0.38 % OP GEL: 1.0000 [drp] | Freq: Three times a day (TID) | OPHTHALMIC | 5 refills | Status: DC

## 2021-01-21 ENCOUNTER — Ambulatory Visit (INDEPENDENT_AMBULATORY_CARE_PROVIDER_SITE_OTHER): Payer: Medicare Other

## 2021-01-21 ENCOUNTER — Other Ambulatory Visit: Payer: Self-pay

## 2021-01-21 VITALS — BP 130/60 | HR 67 | Temp 97.6°F | Resp 16 | Ht 62.0 in | Wt 145.8 lb

## 2021-01-21 DIAGNOSIS — Z0000 Encounter for general adult medical examination without abnormal findings: Secondary | ICD-10-CM

## 2021-01-21 NOTE — Progress Notes (Signed)
Subjective:   Sarah [name] is a 80 y.o. female who presents for Medicare Annual (Subsequent) preventive examination.  Review of Systems    No ROS. Medicare Wellness Visit. Additional risk factors are reflected in social history. Cardiac Risk Factors include: advanced age (>23men, >60 women);dyslipidemia;hypertension     Objective:    Today's Vitals   01/21/21 1358  BP: 130/60  Pulse: 67  Resp: 16  Temp: 97.6 F (36.4 C)  SpO2: 98%  Weight: 145 lb 12.8 oz (66.1 kg)  Height: 5\' 2"  (1.575 m)  PainSc: 0-No pain   Body mass index is 26.67 kg/m.  Advanced Directives 01/21/2021 10/24/2019 06/30/2017 06/30/2017 01/07/2013  Does Patient Have a Medical Advance Directive? Yes No - No Patient does not have advance directive  Does patient want to make changes to medical advance directive? No - Patient declined - - - -  Would patient like information on creating a medical advance directive? - No - Patient declined (No Data) Yes (ED - Information included in AVS) -  Pre-existing out of facility DNR order (yellow form or pink MOST form) - - - - No    Current Medications (verified) Outpatient Encounter Medications as of 01/21/2021  Medication Sig  . acetaminophen (TYLENOL) 500 MG tablet Take 500 mg by mouth every 6 (six) hours as needed.  Marland Kitchen atorvastatin (LIPITOR) 10 MG tablet TAKE 1 TABLET(10 MG) BY MOUTH DAILY  . brimonidine (ALPHAGAN) 0.2 % ophthalmic solution 1 drop 3 (three) times daily.  . Bromfenac Sodium (PROLENSA) 0.07 % SOLN Place 1 drop into both eyes 3 (three) times daily.  . Colchicine (MITIGARE) 0.6 MG CAPS Take 1 tablet by mouth daily.  Marland Kitchen diltiazem (TIAZAC) 300 MG 24 hr capsule TAKE 1 CAPSULE BY MOUTH DAILY BEFORE BREAKFAST  . dorzolamide (TRUSOPT) 2 % ophthalmic solution INSTILL 1 DROP INTO OU BID  . dorzolamide-timolol (COSOPT) 22.3-6.8 MG/ML ophthalmic solution INT 1 DROP IN OU BID  . hydrALAZINE (APRESOLINE) 100 MG tablet Take 50 mg by mouth 3 (three) times daily.   .  hydrALAZINE (APRESOLINE) 50 MG tablet Take 50 mg by mouth 3 (three) times daily.  Marland Kitchen losartan (COZAAR) 25 MG tablet Take 12.5 mg by mouth daily.  . Loteprednol Etabonate (LOTEMAX SM) 0.38 % GEL Place 1 drop into both eyes 3 (three) times daily.  . Nebivolol HCl 20 MG TABS TAKE 1 TABLET(20 MG) BY MOUTH DAILY  . prednisoLONE acetate (PRED FORTE) 1 % ophthalmic suspension 1 drop 2 (two) times daily.  . prednisoLONE acetate (PRED MILD) 0.12 % ophthalmic suspension Place 1 drop into both eyes 4 (four) times daily.  . SYMBICORT 160-4.5 MCG/ACT inhaler INHALE 2 PUFFS INTO THE LUNGS TWICE DAILY  . zolpidem (AMBIEN) 5 MG tablet TAKE 1 TABLET(5 MG) BY MOUTH AT BEDTIME AS NEEDED FOR SLEEP  . [DISCONTINUED] valsartan (DIOVAN) 160 MG tablet Take 1 tablet (160 mg total) by mouth daily.   No facility-administered encounter medications on file as of 01/21/2021.    Allergies (verified) Lisinopril   History: Past Medical History:  Diagnosis Date  . Allergic rhinitis   . Arthritis   . Chronic back pain   . Contact dermatitis and other eczema, due to unspecified cause   . Diabetes mellitus without complication ([place])   . Dysrhythmia    hx of heart beating fast   . Glaucoma    POAG OU  . History of sinusitis   . Hx of adenomatous colonic polyps 10/17/2014  . Hypertension   .  Hypertensive retinopathy    OU  . Peripheral vascular disease ([facility])    hx of DVT - left leg   . Personal history of unspecified circulatory disease   . Plantar fascial fibromatosis   . Shortness of breath    with exertion    Past Surgical History:  Procedure Laterality Date  . ABDOMINAL HYSTERECTOMY    . CATARACT EXTRACTION Bilateral 2018  . EYE SURGERY Bilateral 2018   Cat Sx  . FLEXIBLE SIGMOIDOSCOPY    . FOOT SURGERY Bilateral   . History of Lumbar fusion     '87  . SHOULDER ARTHROSCOPY WITH ROTATOR CUFF REPAIR AND SUBACROMIAL DECOMPRESSION Left 01/14/2013   Procedure: Left Shoulder Arthroscopy with Debridement,  Subacromial Decompression,rotator cuff repair;  Surgeon: Mcarthur Rossetti, MD;  Location: [facility];  Service: Orthopedics;  Laterality: Left;  Left Shoulder Arthroscopy with Debridement, Subacromial Decompression   Family History  Problem Relation Age of Onset  . Leukemia Mother   . Brain cancer Father        mets  . Throat cancer Brother        x 2 brothers   Social History   Socioeconomic History  . Marital status: Legally Separated    Spouse name: Not on file  . Number of children: 2  . Years of education: Not on file  . Highest education level: Not on file  Occupational History  . Occupation: retired  Tobacco Use  . Smoking status: Never Smoker  . Smokeless tobacco: Never Used  Vaping Use  . Vaping Use: Never used  Substance and Sexual Activity  . Alcohol use: No  . Drug use: No  . Sexual activity: Not Currently  Other Topics Concern  . Not on file  Social History Narrative   HSG. Married- '63. 1 daughter '64, 1 son '70,  2 grandchildren. Work: retired. Lives with husband and brother is in the home.   Social Determinants of Health   Financial Resource Strain: Medium Risk  . Difficulty of Paying Living Expenses: Somewhat hard  Food Insecurity: No Food Insecurity  . Worried About Charity fundraiser in the Last Year: Never true  . Ran Out of Food in the Last Year: Never true  Transportation Needs: No Transportation Needs  . Lack of Transportation (Medical): No  . Lack of Transportation (Non-Medical): No  Physical Activity: Sufficiently Active  . Days of Exercise per Week: 5 days  . Minutes of Exercise per Session: 30 min  Stress: No Stress Concern Present  . Feeling of Stress : Not at all  Social Connections: Socially Integrated  . Frequency of Communication with Friends and Family: More than three times a week  . Frequency of Social Gatherings with Friends and Family: Once a week  . Attends Religious Services: More than 4 times per year  . Active Member of  Clubs or Organizations: No  . Attends Archivist Meetings: More than 4 times per year  . Marital Status: Married    Tobacco Counseling Counseling given: Not Answered   Clinical Intake:  Pre-visit preparation completed: Yes  Pain : No/denies pain Pain Score: 0-No pain     BMI - recorded: 26.67 Nutritional Status: BMI 25 -29 Overweight Nutritional Risks: None Diabetes: No  How often do you need to have someone help you when you read instructions, pamphlets, or other written materials from your doctor or pharmacy?: 1 - Never What is the last grade level you completed in school?: HSG  Diabetic?  yes  Interpreter Needed?: No  Information entered by :: Lisette Abu, LPN   Activities of Daily Living In your present state of health, do you have any difficulty performing the following activities: 01/21/2021  Hearing? N  Vision? Y  Difficulty concentrating or making decisions? N  Walking or climbing stairs? N  Dressing or bathing? N  Doing errands, shopping? N  Preparing Food and eating ? N  Using the Toilet? N  In the past six months, have you accidently leaked urine? N  Do you have problems with loss of bowel control? N  Managing your Medications? N  Managing your Finances? N  Housekeeping or managing your Housekeeping? N  Some recent data might be hidden    Patient Care Team: [name], MD as PCP - General (Internal Medicine) Regal, Tamala Fothergill, DPM as Consulting Physician (Podiatry) Claudia Desanctis, MD as Consulting Physician (Internal Medicine) Charlton Haws, [facility] as Pharmacist (Pharmacist)  Indicate any recent Medical Services you may have received from other than [facility] providers in the past year (date may be approximate).     Assessment:   This is a routine wellness examination for [place].  Hearing/Vision screen No exam data present  Dietary issues and exercise activities discussed: Current Exercise Habits: The patient does not  participate in regular exercise at present;Home exercise routine, Type of exercise: walking, Time (Minutes): 30, Frequency (Times/Week): 5, Weekly Exercise (Minutes/Week): 150, Intensity: Mild, Exercise limited by: respiratory conditions(s)  Goals    . Pharmacy Care Plan     CARE PLAN ENTRY  Current Barriers:  . Chronic Disease Management support, education, and care coordination needs related to Hypertension, Hyperlipidemia, and Gout   Hypertension BP Readings from Last 3 Encounters:  06/19/20 138/86  04/24/20 (!) 162/64  10/17/19 (!) 165/71 .  Pharmacist Clinical Goal(s): o Over the next 180 days, patient will work with PharmD and providers to achieve BP goal <140/90 . Current regimen:  o Diltiazem 300 mg daily o Bystolic 20 mg daily o Hydralazine 50 mg 3 times daily . Interventions: o Discussed blood pressure goal and benefits of medications . Patient self care activities - Over the next 180 days, patient will: o Check BP daily, document, and provide at future appointments o Ensure daily salt intake < 2300 mg/day  Hyperlipidemia Lab Results  Component Value Date/Time   LDLCALC 55 10/10/2019 02:43 PM .  Pharmacist Clinical Goal(s): o Over the next 180 days, patient will work with PharmD and providers to maintain LDL goal < 70 . Current regimen:  o Atorvastatin 10 mg daily . Interventions: o Discussed benefits of statin including cholesterol lowering and cardiovascular risk reduction . Patient self care activities - Over the next 180 days, patient will: o Continue medication as directed o Continue low cholesterol diet  Gout . Pharmacist Clinical Goal(s) o Over the next 180 days, patient will work with PharmD and providers to optimize therapy . Current regimen:  o No medication . Interventions: o Discussed low purine diet to prevent gout flares . Patient self care activities - Over the next 180 days, patient will: o Avoid foods high in purines (red meat, seafood,  alcohol)  Medication management . Pharmacist Clinical Goal(s): o Over the next 180 days, patient will work with PharmD and providers to maintain optimal medication adherence . Current pharmacy: [facility] . Interventions o Comprehensive medication review performed. o Switch to [facility] mail order pharmacy for cost savings . Patient self care activities - Over the next 180  days, patient will: o Focus on medication adherence by pill count o Take medications as prescribed o Report any questions or concerns to PharmD and/or provider(s)  Please see past updates related to this goal by clicking on the "Past Updates" button in the selected goal      . Stay as healthy as possible to keep blood pressure and blood sugar in parameter     I will continue to follow heart healthy and diabetic diet.       Depression Screen PHQ 2/9 Scores 01/21/2021 10/24/2019 09/22/2018 03/23/2018 06/30/2017 08/15/2014  PHQ - 2 Score 0 1 0 1 1 0  PHQ- 9 Score - - - 2 3 -    Fall Risk Fall Risk  01/21/2021 10/24/2019 09/22/2018 09/21/2018 06/30/2017  Falls in the past year? 0 0 No No No  Number falls in past yr: 0 0 - - -  Injury with Fall? 0 0 - - -  Risk for fall due to : No Fall Risks - - - -  Follow up - Falls prevention discussed - - -    FALL RISK PREVENTION PERTAINING TO THE HOME:  Any stairs in or around the home? Yes  If so, are there any without handrails? No  Home free of loose throw rugs in walkways, pet beds, electrical cords, etc? Yes  Adequate lighting in your home to reduce risk of falls? Yes   ASSISTIVE DEVICES UTILIZED TO PREVENT FALLS:  Life alert? No  Use of a cane, walker or w/c? No  Grab bars in the bathroom? Yes  Shower chair or bench in shower? Yes  Elevated toilet seat or a handicapped toilet? Yes   TIMED UP AND GO:  Was the test performed? No .  Length of time to ambulate 10 feet: 0 sec.   Gait steady and fast without use of assistive device  Cognitive  Function: MMSE - Mini Mental State Exam 06/30/2017  Orientation to time 5  Orientation to Place 5  Registration 3  Attention/ Calculation 4  Recall 2  Language- name 2 objects 2  Language- repeat 1  Language- follow 3 step command 3  Language- read & follow direction 1  Write a sentence 1  Copy design 1  Total score 28        Immunizations Immunization History  Administered Date(s) Administered  . Fluad Quad(high Dose 65+) 08/01/2019  . Influenza Split 09/08/2011  . Influenza, High Dose Seasonal PF 10/15/2017, 09/21/2018  . Influenza, Seasonal, Injecte, Preservative Fre 12/08/2012  . Influenza,inj,Quad PF,6+ Mos 08/09/2013, 08/14/2014, 01/24/2016, 07/22/2016  . Moderna Sars-Covid-2 Vaccination 01/13/2020, 02/10/2020  . Pneumococcal Conjugate-13 12/18/2014  . Pneumococcal Polysaccharide-23 08/09/2013, 10/10/2019  . Tdap 05/21/2017  . Tetanus 02/09/2014  . Zoster 02/09/2014    TDAP status: Up to date  Flu Vaccine status: Up to date  Pneumococcal vaccine status: Up to date  Covid-19 vaccine status: Completed vaccines  Qualifies for Shingles Vaccine? Yes   Zostavax completed Yes   Shingrix Completed?: No.    Education has been provided regarding the importance of this vaccine. Patient has been advised to call insurance company to determine out of pocket expense if they have not yet received this vaccine. Advised may also receive vaccine at local pharmacy or Health Dept. Verbalized acceptance and understanding.  Screening Tests Health Maintenance  Topic Date Due  . Hepatitis C Screening  Never done  . FOOT EXAM  09/23/2019  . INFLUENZA VACCINE  07/01/2020  . COVID-19 Vaccine (3 - Booster  for Moderna series) 08/12/2020  . HEMOGLOBIN A1C  10/25/2020  . OPHTHALMOLOGY EXAM  11/05/2021  . TETANUS/TDAP  05/22/2027  . DEXA SCAN  Completed  . PNA vac Low Risk Adult  Completed    Health Maintenance  Health Maintenance Due  Topic Date Due  . Hepatitis C Screening   Never done  . FOOT EXAM  09/23/2019  . INFLUENZA VACCINE  07/01/2020  . COVID-19 Vaccine (3 - Booster for Moderna series) 08/12/2020  . HEMOGLOBIN A1C  10/25/2020    Colorectal cancer screening: Type of screening: Colonoscopy. Completed 10/19/2014. Repeat every 0 years  Mammogram status: Completed 01/03/2020. Repeat every year  Bone Density status: Completed 11/14/2019. Results reflect: Bone density results: OSTEOPENIA. Repeat every 3 years.  Lung Cancer Screening: (Low Dose CT Chest recommended if Age 15-80 years, 30 pack-year currently smoking OR have quit w/in 15years.) does not qualify.   Lung Cancer Screening Referral: no  Additional Screening:  Hepatitis C Screening: does qualify; Completed no  Vision Screening: Recommended annual ophthalmology exams for early detection of glaucoma and other disorders of the eye. Is the patient up to date with their annual eye exam?  Yes  Who is the provider or what is the name of the office in which the patient attends annual eye exams? [name], MD. If pt is not established with a provider, would they like to be referred to a provider to establish care? No .   Dental Screening: Recommended annual dental exams for proper oral hygiene  Community Resource Referral / Chronic Care Management: CRR required this visit?  No   CCM required this visit?  No      Plan:     I have personally reviewed and noted the following in the patient's chart:   . Medical and social history . Use of alcohol, tobacco or illicit drugs  . Current medications and supplements . Functional ability and status . Nutritional status . Physical activity . Advanced directives . List of other physicians . Hospitalizations, surgeries, and ER visits in previous 12 months . Vitals . Screenings to include cognitive, depression, and falls . Referrals and appointments  In addition, I have reviewed and discussed with patient certain preventive protocols, quality  metrics, and best practice recommendations. A written personalized care plan for preventive services as well as general preventive health recommendations were provided to patient.     Sheral Flow, LPN   07/18/5630   Nurse Notes:  Medications reviewed with patient; no opioid use noted.

## 2021-01-21 NOTE — Patient Instructions (Addendum)
Sarah [name] , Thank you for taking time to come for your Medicare Wellness Visit. I appreciate your ongoing commitment to your health goals. Please review the following plan we discussed and let me know if I can assist you in the future.   Screening recommendations/referrals: Colonoscopy: 10/19/2014; no repeat colonoscopy per report Mammogram: 01/03/2020 Bone Density: 11/14/2019; due every 3 years Recommended yearly ophthalmology/optometry visit for glaucoma screening and checkup Recommended yearly dental visit for hygiene and checkup  Vaccinations: Influenza vaccine: 11/11/2020 Pneumococcal vaccine: up to date Tdap vaccine: 05/21/2017; due every 10 years Shingles vaccine: never done   Covid-19: 01/13/2020, 02/10/2020, 12/02/2020  Advanced directives: Please bring a copy of your health care power of attorney and living will to the office at your convenience.  Conditions/risks identified: Yes; Reviewed health maintenance screenings with patient today and relevant education, vaccines, and/or referrals were provided. Please continue to do your personal lifestyle choices by: daily care of teeth and gums, regular physical activity (goal should be 5 days a week for 30 minutes), eat a healthy diet, avoid tobacco and drug use, limiting any alcohol intake, taking a low-dose aspirin (if not allergic or have been advised by your provider otherwise) and taking vitamins and minerals as recommended by your provider. Continue doing brain stimulating activities (puzzles, reading, adult coloring books, staying active) to keep memory sharp. Continue to eat heart healthy diet (full of fruits, vegetables, whole grains, lean protein, water--limit salt, fat, and sugar intake) and increase physical activity as tolerated.  Next appointment: Please schedule your next Medicare Wellness Visit with your Nurse Health Advisor in 1 year by calling [phone].   Preventive Care 100 Years and Older, Female Preventive care refers  to lifestyle choices and visits with your health care provider that can promote health and wellness. What does preventive care include?  A yearly physical exam. This is also called an annual well check.  Dental exams once or twice a year.  Routine eye exams. Ask your health care provider how often you should have your eyes checked.  Personal lifestyle choices, including:  Daily care of your teeth and gums.  Regular physical activity.  Eating a healthy diet.  Avoiding tobacco and drug use.  Limiting alcohol use.  Practicing safe sex.  Taking low-dose aspirin every day.  Taking vitamin and mineral supplements as recommended by your health care provider. What happens during an annual well check? The services and screenings done by your health care provider during your annual well check will depend on your age, overall health, lifestyle risk factors, and family history of disease. Counseling  Your health care provider may ask you questions about your:  Alcohol use.  Tobacco use.  Drug use.  Emotional well-being.  Home and relationship well-being.  Sexual activity.  Eating habits.  History of falls.  Memory and ability to understand (cognition).  Work and work Statistician.  Reproductive health. Screening  You may have the following tests or measurements:  Height, weight, and BMI.  Blood pressure.  Lipid and cholesterol levels. These may be checked every 5 years, or more frequently if you are over 28 years old.  Skin check.  Lung cancer screening. You may have this screening every year starting at age 58 if you have a 30-pack-year history of smoking and currently smoke or have quit within the past 15 years.  Fecal occult blood test (FOBT) of the stool. You may have this test every year starting at age 35.  Flexible sigmoidoscopy or colonoscopy. You may  have a sigmoidoscopy every 5 years or a colonoscopy every 10 years starting at age 69.  Hepatitis C  blood test.  Hepatitis B blood test.  Sexually transmitted disease (STD) testing.  Diabetes screening. This is done by checking your blood sugar (glucose) after you have not eaten for a while (fasting). You may have this done every 1-3 years.  Bone density scan. This is done to screen for osteoporosis. You may have this done starting at age 80.  Mammogram. This may be done every 1-2 years. Talk to your health care provider about how often you should have regular mammograms. Talk with your health care provider about your test results, treatment options, and if necessary, the need for more tests. Vaccines  Your health care provider may recommend certain vaccines, such as:  Influenza vaccine. This is recommended every year.  Tetanus, diphtheria, and acellular pertussis (Tdap, Td) vaccine. You may need a Td booster every 10 years.  Zoster vaccine. You may need this after age 68.  Pneumococcal 13-valent conjugate (PCV13) vaccine. One dose is recommended after age 46.  Pneumococcal polysaccharide (PPSV23) vaccine. One dose is recommended after age 53. Talk to your health care provider about which screenings and vaccines you need and how often you need them. This information is not intended to replace advice given to you by your health care provider. Make sure you discuss any questions you have with your health care provider. Document Released: 12/14/2015 Document Revised: 08/06/2016 Document Reviewed: 09/18/2015 Elsevier Interactive Patient Education  2017 [place] Prevention in the Home Falls can cause injuries. They can happen to people of all ages. There are many things you can do to make your home safe and to help prevent falls. What can I do on the outside of my home?  Regularly fix the edges of walkways and driveways and fix any cracks.  Remove anything that might make you trip as you walk through a door, such as a raised step or threshold.  Trim any bushes or trees  on the path to your home.  Use bright outdoor lighting.  Clear any walking paths of anything that might make someone trip, such as rocks or tools.  Regularly check to see if handrails are loose or broken. Make sure that both sides of any steps have handrails.  Any raised decks and porches should have guardrails on the edges.  Have any leaves, snow, or ice cleared regularly.  Use sand or salt on walking paths during winter.  Clean up any spills in your garage right away. This includes oil or grease spills. What can I do in the bathroom?  Use night lights.  Install grab bars by the toilet and in the tub and shower. Do not use towel bars as grab bars.  Use non-skid mats or decals in the tub or shower.  If you need to sit down in the shower, use a plastic, non-slip stool.  Keep the floor dry. Clean up any water that spills on the floor as soon as it happens.  Remove soap buildup in the tub or shower regularly.  Attach bath mats securely with double-sided non-slip rug tape.  Do not have throw rugs and other things on the floor that can make you trip. What can I do in the bedroom?  Use night lights.  Make sure that you have a light by your bed that is easy to reach.  Do not use any sheets or blankets that are too big for your  bed. They should not hang down onto the floor.  Have a firm chair that has side arms. You can use this for support while you get dressed.  Do not have throw rugs and other things on the floor that can make you trip. What can I do in the kitchen?  Clean up any spills right away.  Avoid walking on wet floors.  Keep items that you use a lot in easy-to-reach places.  If you need to reach something above you, use a strong step stool that has a grab bar.  Keep electrical cords out of the way.  Do not use floor polish or wax that makes floors slippery. If you must use wax, use non-skid floor wax.  Do not have throw rugs and other things on the floor  that can make you trip. What can I do with my stairs?  Do not leave any items on the stairs.  Make sure that there are handrails on both sides of the stairs and use them. Fix handrails that are broken or loose. Make sure that handrails are as long as the stairways.  Check any carpeting to make sure that it is firmly attached to the stairs. Fix any carpet that is loose or worn.  Avoid having throw rugs at the top or bottom of the stairs. If you do have throw rugs, attach them to the floor with carpet tape.  Make sure that you have a light switch at the top of the stairs and the bottom of the stairs. If you do not have them, ask someone to add them for you. What else can I do to help prevent falls?  Wear shoes that:  Do not have high heels.  Have rubber bottoms.  Are comfortable and fit you well.  Are closed at the toe. Do not wear sandals.  If you use a stepladder:  Make sure that it is fully opened. Do not climb a closed stepladder.  Make sure that both sides of the stepladder are locked into place.  Ask someone to hold it for you, if possible.  Clearly mark and make sure that you can see:  Any grab bars or handrails.  First and last steps.  Where the edge of each step is.  Use tools that help you move around (mobility aids) if they are needed. These include:  Canes.  Walkers.  Scooters.  Crutches.  Turn on the lights when you go into a dark area. Replace any light bulbs as soon as they burn out.  Set up your furniture so you have a clear path. Avoid moving your furniture around.  If any of your floors are uneven, fix them.  If there are any pets around you, be aware of where they are.  Review your medicines with your doctor. Some medicines can make you feel dizzy. This can increase your chance of falling. Ask your doctor what other things that you can do to help prevent falls. This information is not intended to replace advice given to you by your health  care provider. Make sure you discuss any questions you have with your health care provider. Document Released: 09/13/2009 Document Revised: 04/24/2016 Document Reviewed: 12/22/2014 Elsevier Interactive Patient Education  2017 [employer].

## 2021-01-24 ENCOUNTER — Other Ambulatory Visit: Payer: Self-pay | Admitting: Internal Medicine

## 2021-01-25 ENCOUNTER — Other Ambulatory Visit: Payer: Self-pay | Admitting: Internal Medicine

## 2021-01-25 DIAGNOSIS — I10 Essential (primary) hypertension: Secondary | ICD-10-CM

## 2021-01-25 DIAGNOSIS — F409 Phobic anxiety disorder, unspecified: Secondary | ICD-10-CM

## 2021-01-25 MED ORDER — ZOLPIDEM TARTRATE 5 MG PO TABS
5.0000 mg | ORAL_TABLET | Freq: Every evening | ORAL | 0 refills | Status: DC | PRN
Start: 2021-01-25 — End: 2021-04-18

## 2021-01-30 DIAGNOSIS — N1831 Chronic kidney disease, stage 3a: Secondary | ICD-10-CM | POA: Diagnosis not present

## 2021-01-30 DIAGNOSIS — Z1231 Encounter for screening mammogram for malignant neoplasm of breast: Secondary | ICD-10-CM | POA: Diagnosis not present

## 2021-01-31 NOTE — Progress Notes (Addendum)
[facility] Note  02/04/2021     CHIEF COMPLAINT Patient presents for Retina Follow Up   HISTORY OF PRESENT ILLNESS: Sarah [name] is a 80 y.o. female who presents to the clinic today for:   HPI    Retina Follow Up    Patient presents with  Other.  In both eyes.  This started weeks ago.  Severity is moderate.  Duration of weeks.  Since onset it is stable.  I, the attending physician,  performed the HPI with the patient and updated documentation appropriately.          Comments    Currently using: Brimonidine TID OU Dorzolamide TID OU Rocklatan QHS OU Prolensa QID OU Prednisolone QID OU  Pt states her vision is the same OU.  Pt denies eye pain OD but has had some eye pain OS.  Denies any new or worsening floaters or fol OU.       Last edited by [name], MD on 02/04/2021 10:18 PM. 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 brimonidine (ALPHAGAN) 0.2 % ophthalmic solution 1 drop 3 (three) times daily.  . Bromfenac Sodium (PROLENSA) 0.07 % SOLN Place 1 drop into both eyes 3 (three) times daily.  . dorzolamide (TRUSOPT) 2 % ophthalmic solution INSTILL 1 DROP INTO OU BID  . dorzolamide-timolol (COSOPT) 22.3-6.8 MG/ML ophthalmic solution INT 1 DROP IN OU BID  . Loteprednol Etabonate (LOTEMAX SM) 0.38 % GEL Place 1 drop into both eyes 3 (three) times daily.  . prednisoLONE acetate (PRED FORTE) 1 % ophthalmic suspension 1 drop 2 (two) times daily.   No current facility-administered medications for this visit. (Ophthalmic Drugs)   Current Outpatient Medications (Other)   Medication Sig  . acetaminophen (TYLENOL) 500 MG tablet Take 500 mg by mouth every 6 (six) hours as needed.  Marland Kitchen atorvastatin (LIPITOR) 10 MG tablet TAKE 1 TABLET(10 MG) BY MOUTH DAILY  . Colchicine (MITIGARE) 0.6 MG CAPS Take 1 tablet by mouth daily.  Marland Kitchen diltiazem (TIAZAC) 300 MG 24 hr capsule TAKE 1 CAPSULE BY MOUTH DAILY BEFORE BREAKFAST  . hydrALAZINE (APRESOLINE) 100 MG tablet Take 50 mg by mouth 3 (three) times daily.   . hydrALAZINE (APRESOLINE) 50 MG tablet Take 50 mg by mouth 3 (three) times daily.  Marland Kitchen losartan (COZAAR) 25 MG tablet Take 12.5 mg by mouth daily.  . Nebivolol HCl 20 MG TABS TAKE 1 TABLET(20 MG) BY MOUTH DAILY  . SYMBICORT 160-4.5 MCG/ACT inhaler INHALE 2 PUFFS INTO THE LUNGS TWICE DAILY  . zolpidem (AMBIEN) 5 MG tablet Take 1 tablet (5 mg total) by mouth at bedtime as needed for sleep.   No current facility-administered medications for this visit. (Other)      REVIEW OF SYSTEMS: ROS    Positive for: Neurological, Musculoskeletal, Eyes, Respiratory   Negative for: Constitutional, Gastrointestinal, Skin, Genitourinary, HENT, Endocrine, Cardiovascular, Psychiatric, Allergic/Imm, Heme/Lymph   Last edited by [name] on 02/04/2021  1:18 PM. (History)       ALLERGIES Allergies  Allergen Reactions  . Lisinopril     REACTION: Lightheaded "sick"  PAST MEDICAL HISTORY Past Medical History:  Diagnosis Date  . Allergic rhinitis   . Arthritis   . Chronic back pain   . Contact dermatitis and other eczema, due to unspecified cause   . Diabetes mellitus without complication ([place])   . Dysrhythmia    hx of heart beating fast   . Glaucoma    POAG OU  . History of sinusitis   . Hx of adenomatous colonic polyps 10/17/2014  . Hypertension   . Hypertensive retinopathy    OU  . Peripheral vascular disease (H[facility])    hx of DVT - left leg   . Personal history of unspecified circulatory disease   . Plantar fascial fibromatosis   . Shortness of breath    with  exertion    Past Surgical History:  Procedure Laterality Date  . ABDOMINAL HYSTERECTOMY    . CATARACT EXTRACTION Bilateral 2018  . EYE SURGERY Bilateral 2018   Cat Sx  . FLEXIBLE SIGMOIDOSCOPY    . FOOT SURGERY Bilateral   . History of Lumbar fusion     '87  . SHOULDER ARTHROSCOPY WITH ROTATOR CUFF REPAIR AND SUBACROMIAL DECOMPRESSION Left 01/14/2013   Procedure: Left Shoulder Arthroscopy with Debridement, Subacromial Decompression,rotator cuff repair;  Surgeon: Mcarthur Rossetti, MD;  Location: [facility];  Service: Orthopedics;  Laterality: Left;  Left Shoulder Arthroscopy with Debridement, Subacromial Decompression    FAMILY HISTORY Family History  Problem Relation Age of Onset  . Leukemia Mother   . Brain cancer Father        mets  . Throat cancer Brother        x 2 brothers    SOCIAL HISTORY Social History   Tobacco Use  . Smoking status: Never Smoker  . Smokeless tobacco: Never Used  Vaping Use  . Vaping Use: Never used  Substance Use Topics  . Alcohol use: No  . Drug use: No         OPHTHALMIC EXAM:  Base Eye Exam    Visual Acuity (Snellen - Linear)      Right Left   Dist [place] 20/25 20/70 -1   Dist ph [place] NI 20/40 -1       Tonometry (Tonopen, 1:21 PM)      Right Left   Pressure 17 23       Pupils      Dark Light Shape React APD   Right 3 2 Round Minimal 0   Left 3 2 Round Minimal 0       Visual Fields      Left Right    Full Full       Extraocular Movement      Right Left    Full Full       Neuro/Psych    Oriented x3: Yes   Mood/Affect: Normal       Dilation    Both eyes: 1.0% Mydriacyl, 2.5% Phenylephrine @ 1:21 PM        Slit Lamp and Fundus Exam    Slit Lamp Exam      Right Left   Lids/Lashes Dermatochalasis - upper lid Dermatochalasis - upper lid   Conjunctiva/Sclera Melanosis, 2+ Injection Melanosis, 2+ Injection   Cornea arcus, well healed temporal cataract wounds, trace Punctate epithelial erosions, early band K nasal  and temporal arcus, well healed temporal cataract wounds, trace Punctate epithelial erosions, early band K nasal and temporal   Anterior Chamber Deep, 0.5+pigment Deep, 1+cell/pigment   Iris Round and dilated, mild atrophy  Round and dilated, mild, diffuse atrophy   Lens PC IOL in good position PC IOL in good position   Vitreous Vitreous syneresis, +pigment, mild vitreous condensations Vitreous syneresis, fine pigment, vitreous condensations       Fundus Exam      Right Left   Disc Pink and Sharp, PPA/PPP Pink and Sharp, Compact, temporal PPA   C/D Ratio 0.5 0.5   Macula Flat, Blunted foveal reflex, RPE mottling, drusen temporal macula, No heme or edema Flat, Blunted foveal reflex, central cystic changes, drusen, RPE mottling, no heme   Vessels attenuated, Tortuous attenuated, mild tortuousity   Periphery Attached, no heme    Attached, trace peripheral MA             IMAGING AND PROCEDURES  Imaging and Procedures for 02/04/2021  OCT, Retina - OU - Both Eyes       Right Eye Quality was good. Central Foveal Thickness: 300. Progression has improved. Findings include abnormal foveal contour, no SRF, myopic contour, no IRF (Interval improvement in cystic changes).   Left Eye Quality was good. Central Foveal Thickness: 336. Progression has improved. Findings include abnormal foveal contour, intraretinal fluid, no SRF, myopic contour (Slight improvement in IRF).   Notes *Images captured and stored on drive  Diagnosis / Impression:  +CME OU (OS > OD) OD: Interval improvement in cystic changes OS: Slight improvement in IRF  Clinical management:  See below  Abbreviations: NFP - Normal foveal profile. CME - cystoid macular edema. PED - pigment epithelial detachment. IRF - intraretinal fluid. SRF - subretinal fluid. EZ - ellipsoid zone. ERM - epiretinal membrane. ORA - outer retinal atrophy. ORT - outer retinal tubulation. SRHM - subretinal hyper-reflective material. IRHM - intraretinal  hyper-reflective material                 ASSESSMENT/PLAN:    ICD-10-CM   1. 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 Retinal edema  H35.81 OCT, Retina - OU - Both Eyes  4. Essential hypertension  I10   5. Hypertensive retinopathy of both eyes  H35.033   6. Pseudophakia, both eyes  Z96.1   7. Primary open angle glaucoma (POAG) of both eyes, mild stage  H40.1131     1-3. CME OU - [place] / posterior uveitis OU  - pt with history of chronic CME post cataract surgery OU in 2018  - history of improvement on topical steroid and NSAID, but recurrence w/ tapering; also history of steroid response  - FA 01.24.22 shows hyperfluorescence of disc and perifoveal hyperfluorescence OU consistent with [place] CME; also shows focal peripheral hyperfluorescence OU consistent with posterior uveitis / inflammatory component  - currently using Prednisolone and Prolensa QID instead of TID OU  - IOP improved to 17 OD, stable at 23 OS  - BCVA 20/25 OD, 20/40 OS -- stable  - OCT shows OD: interval improvement in cystic changes  - discussed findings, prognosis and treatment options  - dec Lotemax SM and prolensa to TID OU  - f/u 3 weeks, DFE, OCT -- STK OS if CME persists  4,5. Hypertensive retinopathy OU - discussed importance of tight BP control - monitor  6. Pseudophakia OU  - s/p CE/IOL OU (2018)  - IOLs in good position  - chronic CME as above  - monitor  7. POAG OU  - under the expert manage of Dr. Kathlen Mody  - mild stage, s/p SLT OU x2, s/p  goniotomy OU  - history of steroid response  - IOP 17, 23 today  - on Rocklatan qhs OU, Cosopt and Brim TID OU   Ophthalmic Meds Ordered this visit:  No orders of the defined types were placed in this encounter.     Return in about 3 weeks (around 02/25/2021) for f/u CME OU, DFE, OCT.  There are no Patient Instructions on file for this visit.  This document serves as a record of  services personally performed by Gardiner Sleeper, MD, PhD. It was created on their behalf by Leeann Must, [place], an ophthalmic technician. The creation of this record is the provider's dictation and/or activities during the visit.    Electronically signed by: Leeann Must, [place] 03.03.2022 10:31 PM   This document serves as a record of services personally performed by Gardiner Sleeper, MD, PhD. It was created on their behalf by San Jetty. Owens Shark, OA an ophthalmic technician. The creation of this record is the provider's dictation and/or activities during the visit.    Electronically signed by: San Jetty. [place], [place] 03.07.2022 10:31 PM  Gardiner Sleeper, M.D., Ph.D. Diseases & Surgery of the Retina and [place] 02/04/2021   I have reviewed the above documentation for accuracy and completeness, and I agree with the above. Gardiner Sleeper, M.D., Ph.D. 02/04/21 10:31 PM   Abbreviations: M myopia (nearsighted); A astigmatism; H hyperopia (farsighted); P presbyopia; Mrx spectacle prescription;  CTL contact lenses; OD right eye; OS left eye; OU both eyes  XT exotropia; ET esotropia; PEK punctate epithelial keratitis; PEE punctate epithelial erosions; DES dry eye syndrome; MGD meibomian gland dysfunction; ATs artificial tears; PFAT's preservative free artificial tears; [place] nuclear sclerotic cataract; PSC posterior subcapsular cataract; ERM epi-retinal membrane; PVD posterior vitreous detachment; RD retinal detachment; DM diabetes mellitus; DR diabetic retinopathy; NPDR non-proliferative diabetic retinopathy; PDR proliferative diabetic retinopathy; CSME clinically significant macular edema; DME diabetic macular edema; dbh dot blot hemorrhages; CWS cotton wool spot; POAG primary open angle glaucoma; C/D cup-to-disc ratio; HVF humphrey visual field; GVF goldmann visual field; OCT optical coherence tomography; IOP intraocular pressure; BRVO Branch retinal vein occlusion; CRVO  central retinal vein occlusion; CRAO central retinal artery occlusion; BRAO branch retinal artery occlusion; RT retinal tear; SB scleral buckle; PPV pars plana vitrectomy; VH Vitreous hemorrhage; PRP panretinal laser photocoagulation; IVK intravitreal kenalog; VMT vitreomacular traction; MH Macular hole;  NVD neovascularization of the disc; NVE neovascularization elsewhere; AREDS age related eye disease study; ARMD age related macular degeneration; POAG primary open angle glaucoma; EBMD epithelial/anterior basement membrane dystrophy; ACIOL anterior chamber intraocular lens; IOL intraocular lens; PCIOL posterior chamber intraocular lens; Phaco/IOL phacoemulsification with intraocular lens placement; [place] photorefractive keratectomy; LASIK laser assisted in situ keratomileusis; HTN hypertension; DM diabetes mellitus; COPD chronic obstructive pulmonary disease

## 2021-02-04 ENCOUNTER — Other Ambulatory Visit: Payer: Self-pay

## 2021-02-04 ENCOUNTER — Encounter (INDEPENDENT_AMBULATORY_CARE_PROVIDER_SITE_OTHER): Payer: Self-pay | Admitting: Ophthalmology

## 2021-02-04 ENCOUNTER — Ambulatory Visit (INDEPENDENT_AMBULATORY_CARE_PROVIDER_SITE_OTHER): Payer: Medicare Other | Admitting: Ophthalmology

## 2021-02-04 DIAGNOSIS — Z961 Presence of intraocular lens: Secondary | ICD-10-CM

## 2021-02-04 DIAGNOSIS — I10 Essential (primary) hypertension: Secondary | ICD-10-CM

## 2021-02-04 DIAGNOSIS — H3093 Unspecified chorioretinal inflammation, bilateral: Secondary | ICD-10-CM

## 2021-02-04 DIAGNOSIS — H35353 Cystoid macular degeneration, bilateral: Secondary | ICD-10-CM

## 2021-02-04 DIAGNOSIS — H35033 Hypertensive retinopathy, bilateral: Secondary | ICD-10-CM

## 2021-02-04 DIAGNOSIS — H3581 Retinal edema: Secondary | ICD-10-CM

## 2021-02-04 DIAGNOSIS — H401131 Primary open-angle glaucoma, bilateral, mild stage: Secondary | ICD-10-CM

## 2021-02-11 DIAGNOSIS — I129 Hypertensive chronic kidney disease with stage 1 through stage 4 chronic kidney disease, or unspecified chronic kidney disease: Secondary | ICD-10-CM | POA: Diagnosis not present

## 2021-02-11 DIAGNOSIS — N1832 Chronic kidney disease, stage 3b: Secondary | ICD-10-CM | POA: Diagnosis not present

## 2021-02-11 DIAGNOSIS — D649 Anemia, unspecified: Secondary | ICD-10-CM | POA: Diagnosis not present

## 2021-02-11 DIAGNOSIS — N281 Cyst of kidney, acquired: Secondary | ICD-10-CM | POA: Diagnosis not present

## 2021-02-18 ENCOUNTER — Encounter: Payer: Self-pay | Admitting: Internal Medicine

## 2021-02-18 ENCOUNTER — Ambulatory Visit (INDEPENDENT_AMBULATORY_CARE_PROVIDER_SITE_OTHER): Payer: Medicare Other | Admitting: Internal Medicine

## 2021-02-18 ENCOUNTER — Other Ambulatory Visit: Payer: Self-pay

## 2021-02-18 VITALS — BP 150/70 | HR 74 | Temp 98.0°F | Ht 62.0 in | Wt 144.0 lb

## 2021-02-18 DIAGNOSIS — N183 Chronic kidney disease, stage 3 unspecified: Secondary | ICD-10-CM | POA: Diagnosis not present

## 2021-02-18 DIAGNOSIS — I10 Essential (primary) hypertension: Secondary | ICD-10-CM

## 2021-02-18 DIAGNOSIS — M1A39X0 Chronic gout due to renal impairment, multiple sites, without tophus (tophi): Secondary | ICD-10-CM

## 2021-02-18 DIAGNOSIS — E118 Type 2 diabetes mellitus with unspecified complications: Secondary | ICD-10-CM

## 2021-02-18 DIAGNOSIS — E790 Hyperuricemia without signs of inflammatory arthritis and tophaceous disease: Secondary | ICD-10-CM | POA: Diagnosis not present

## 2021-02-18 DIAGNOSIS — D638 Anemia in other chronic diseases classified elsewhere: Secondary | ICD-10-CM

## 2021-02-18 DIAGNOSIS — E559 Vitamin D deficiency, unspecified: Secondary | ICD-10-CM

## 2021-02-18 DIAGNOSIS — E785 Hyperlipidemia, unspecified: Secondary | ICD-10-CM

## 2021-02-18 DIAGNOSIS — Z0000 Encounter for general adult medical examination without abnormal findings: Secondary | ICD-10-CM

## 2021-02-18 LAB — CBC WITH DIFFERENTIAL/PLATELET
Basophils Absolute: 0 10*3/uL (ref 0.0–0.1)
Basophils Relative: 0.3 % (ref 0.0–3.0)
Eosinophils Absolute: 0.8 10*3/uL — ABNORMAL HIGH (ref 0.0–0.7)
Eosinophils Relative: 14.5 % — ABNORMAL HIGH (ref 0.0–5.0)
HCT: 33.9 % — ABNORMAL LOW (ref 36.0–46.0)
Hemoglobin: 11.2 g/dL — ABNORMAL LOW (ref 12.0–15.0)
Lymphocytes Relative: 27.1 % (ref 12.0–46.0)
Lymphs Abs: 1.5 10*3/uL (ref 0.7–4.0)
MCHC: 33.1 g/dL (ref 30.0–36.0)
MCV: 90.5 fl (ref 78.0–100.0)
Monocytes Absolute: 0.6 10*3/uL (ref 0.1–1.0)
Monocytes Relative: 10 % (ref 3.0–12.0)
Neutro Abs: 2.7 10*3/uL (ref 1.4–7.7)
Neutrophils Relative %: 48.1 % (ref 43.0–77.0)
Platelets: 206 10*3/uL (ref 150.0–400.0)
RBC: 3.74 Mil/uL — ABNORMAL LOW (ref 3.87–5.11)
RDW: 13.4 % (ref 11.5–15.5)
WBC: 5.6 10*3/uL (ref 4.0–10.5)

## 2021-02-18 LAB — LIPID PANEL
Cholesterol: 144 mg/dL (ref 0–200)
HDL: 73.5 mg/dL (ref >39.00)
LDL Cholesterol: 60 mg/dL (ref 0–99)
NonHDL: 70.47
Total CHOL/HDL Ratio: 2
Triglycerides: 53 mg/dL (ref 0.0–149.0)
VLDL: 10.6 mg/dL (ref 0.0–40.0)

## 2021-02-18 LAB — URIC ACID: Uric Acid, Serum: 8.5 mg/dL — ABNORMAL HIGH (ref 2.4–7.0)

## 2021-02-18 LAB — BASIC METABOLIC PANEL
BUN: 37 mg/dL — ABNORMAL HIGH (ref 6–23)
CO2: 20 mEq/L (ref 19–32)
Calcium: 10 mg/dL (ref 8.4–10.5)
Chloride: 108 mEq/L (ref 96–112)
Creatinine, Ser: 1.59 mg/dL — ABNORMAL HIGH (ref 0.40–1.20)
GFR: 30.83 mL/min — ABNORMAL LOW (ref >60.00)
Glucose, Bld: 84 mg/dL (ref 70–99)
Potassium: 4.5 mEq/L (ref 3.5–5.1)
Sodium: 141 mEq/L (ref 135–145)

## 2021-02-18 LAB — URINALYSIS, ROUTINE W REFLEX MICROSCOPIC
Bilirubin Urine: NEGATIVE
Hgb urine dipstick: NEGATIVE
Ketones, ur: NEGATIVE
Leukocytes,Ua: NEGATIVE
Nitrite: NEGATIVE
RBC / HPF: NONE SEEN (ref 0–?)
Specific Gravity, Urine: <=1.005 — AB (ref 1.000–1.030)
Total Protein, Urine: NEGATIVE
Urine Glucose: NEGATIVE
Urobilinogen, UA: 0.2 (ref 0.0–1.0)
pH: 6 (ref 5.0–8.0)

## 2021-02-18 LAB — TSH: TSH: 1.68 u[IU]/mL (ref 0.35–4.50)

## 2021-02-18 LAB — HEPATIC FUNCTION PANEL
ALT: 13 U/L (ref 0–35)
AST: 15 U/L (ref 0–37)
Albumin: 4.6 g/dL (ref 3.5–5.2)
Alkaline Phosphatase: 81 U/L (ref 39–117)
Bilirubin, Direct: 0 mg/dL (ref 0.0–0.3)
Total Bilirubin: 0.4 mg/dL (ref 0.2–1.2)
Total Protein: 7.8 g/dL (ref 6.0–8.3)

## 2021-02-18 LAB — HEMOGLOBIN A1C: Hgb A1c MFr Bld: 5.7 % (ref 4.6–6.5)

## 2021-02-18 LAB — VITAMIN D 25 HYDROXY (VIT D DEFICIENCY, FRACTURES): VITD: 50.53 ng/mL (ref 30.00–100.00)

## 2021-02-18 MED ORDER — ATORVASTATIN CALCIUM 10 MG PO TABS: ORAL_TABLET | ORAL | 1 refills | Status: DC

## 2021-02-18 MED ORDER — NEBIVOLOL HCL 20 MG PO TABS: 1.0000 | ORAL_TABLET | Freq: Every day | ORAL | 0 refills | Status: DC

## 2021-02-18 MED ORDER — HYDRALAZINE HCL 50 MG PO TABS: 50.0000 mg | ORAL_TABLET | Freq: Three times a day (TID) | ORAL | 0 refills | Status: DC

## 2021-02-18 MED ORDER — LOSARTAN POTASSIUM 25 MG PO TABS: 12.5000 mg | ORAL_TABLET | Freq: Every day | ORAL | 0 refills | Status: DC

## 2021-02-18 MED ORDER — DILTIAZEM HCL ER BEADS 300 MG PO CP24: ORAL_CAPSULE | ORAL | 1 refills | Status: DC

## 2021-02-18 NOTE — Progress Notes (Unsigned)
Subjective:  Patient ID: Sarah [name], female    DOB: 1941/06/22  Age: 80 y.o. MRN: [id number]  CC: Annual Exam, Anemia, Diabetes, Hypertension, and Hyperlipidemia  This visit occurred during the SARS-CoV-2 public health emergency.  Safety protocols were in place, including screening questions prior to the visit, additional usage of staff PPE, and extensive cleaning of exam room while observing appropriate contact time as indicated for disinfecting solutions.    HPI Sarah [name] presents for a CPX.  She walks several miles a day.  She does not experience chest pain, shortness of breath, palpitations, edema, or fatigue.  She has rare episodes of lightheadedness.  Outpatient Medications Prior to Visit  Medication Sig Dispense Refill  . acetaminophen (TYLENOL) 500 MG tablet Take 500 mg by mouth every 6 (six) hours as needed.    . brimonidine (ALPHAGAN) 0.2 % ophthalmic solution 1 drop 3 (three) times daily.    . Bromfenac Sodium (PROLENSA) 0.07 % SOLN Place 1 drop into both eyes 3 (three) times daily. 6 mL 3  . dorzolamide (TRUSOPT) 2 % ophthalmic solution INSTILL 1 DROP INTO OU BID    . dorzolamide-timolol (COSOPT) 22.3-6.8 MG/ML ophthalmic solution INT 1 DROP IN OU BID    . Loteprednol Etabonate (LOTEMAX SM) 0.38 % GEL Place 1 drop into both eyes 3 (three) times daily. 5 g 5  . prednisoLONE acetate (PRED FORTE) 1 % ophthalmic suspension 1 drop 2 (two) times daily.    . SYMBICORT 160-4.5 MCG/ACT inhaler INHALE 2 PUFFS INTO THE LUNGS TWICE DAILY 30.6 g 5  . zolpidem (AMBIEN) 5 MG tablet Take 1 tablet (5 mg total) by mouth at bedtime as needed for sleep. 90 tablet 0  . atorvastatin (LIPITOR) 10 MG tablet TAKE 1 TABLET(10 MG) BY MOUTH DAILY 90 tablet 1  . Colchicine (MITIGARE) 0.6 MG CAPS Take 1 tablet by mouth daily. 21 capsule 0  . diltiazem (TIAZAC) 300 MG 24 hr capsule TAKE 1 CAPSULE BY MOUTH DAILY BEFORE BREAKFAST (Patient taking differently: TAKE 1 CAPSULE BY MOUTH DAILY BEFORE  BREAKFAST) 90 capsule 1  . hydrALAZINE (APRESOLINE) 100 MG tablet Take 50 mg by mouth 3 (three) times daily.     . hydrALAZINE (APRESOLINE) 50 MG tablet Take 50 mg by mouth 3 (three) times daily.    Marland Kitchen losartan (COZAAR) 25 MG tablet Take 12.5 mg by mouth daily.    . Nebivolol HCl 20 MG TABS TAKE 1 TABLET(20 MG) BY MOUTH DAILY 90 tablet 0   No facility-administered medications prior to visit.    ROS Review of Systems  Constitutional: Negative for appetite change, chills, diaphoresis, fatigue and fever.  HENT: Negative.   Eyes: Negative for visual disturbance.  Respiratory: Negative for cough, chest tightness, shortness of breath and wheezing.   Cardiovascular: Negative for chest pain, palpitations and leg swelling.  Gastrointestinal: Negative for abdominal pain, constipation, diarrhea, nausea and vomiting.  Endocrine: Negative.   Genitourinary: Negative.  Negative for difficulty urinating.  Musculoskeletal: Positive for arthralgias. Negative for back pain and myalgias.  Skin: Negative for color change.  Neurological: Positive for light-headedness. Negative for dizziness, syncope and weakness.  Hematological: Negative for adenopathy. Does not bruise/bleed easily.  Psychiatric/Behavioral: Negative.     Objective:  BP (!) 150/70 (BP Location: Left Arm, Patient Position: Sitting, Cuff Size: Large)   Pulse 74   Temp 98 F (36.7 C) (Oral)   Ht 5\' 2"  (1.575 m)   Wt 144 lb (65.3 kg)   SpO2 98%  BMI 26.34 kg/m   BP Readings from Last 3 Encounters:  02/18/21 (!) 150/70  01/21/21 130/60  06/19/20 138/86    Wt Readings from Last 3 Encounters:  02/18/21 144 lb (65.3 kg)  01/21/21 145 lb 12.8 oz (66.1 kg)  06/19/20 138 lb (62.6 kg)    Physical Exam Vitals reviewed.  HENT:     Nose: Nose normal.     Mouth/Throat:     Mouth: Mucous membranes are moist.  Eyes:     General: No scleral icterus.    Conjunctiva/sclera: Conjunctivae normal.  Cardiovascular:     Rate and Rhythm:  Normal rate and regular rhythm.     Heart sounds: No murmur heard.     Comments: EKG- NSR, 66 bpm Normal EKG Pulmonary:     Effort: Pulmonary effort is normal.     Breath sounds: Normal breath sounds.  Abdominal:     General: Abdomen is flat.     Palpations: There is no mass.     Tenderness: There is no abdominal tenderness. There is no guarding.  Musculoskeletal:        General: No swelling or tenderness. Normal range of motion.     Cervical back: Neck supple.     Right lower leg: No edema.     Left lower leg: No edema.  Lymphadenopathy:     Cervical: No cervical adenopathy.  Skin:    General: Skin is warm and dry.     Coloration: Skin is not jaundiced or pale.  Neurological:     General: No focal deficit present.     Mental Status: She is alert.  Psychiatric:        Mood and Affect: Mood normal.        Behavior: Behavior normal.     Lab Results  Component Value Date   WBC 5.6 02/18/2021   HGB 11.2 (L) 02/18/2021   HCT 33.9 (L) 02/18/2021   PLT 206.0 02/18/2021   GLUCOSE 84 02/18/2021   CHOL 144 02/18/2021   TRIG 53.0 02/18/2021   HDL 73.50 02/18/2021   LDLCALC 60 02/18/2021   ALT 13 02/18/2021   AST 15 02/18/2021   NA 141 02/18/2021   K 4.5 02/18/2021   CL 108 02/18/2021   CREATININE 1.59 (H) 02/18/2021   BUN 37 (H) 02/18/2021   CO2 20 02/18/2021   TSH 1.68 02/18/2021   HGBA1C 5.7 02/18/2021   MICROALBUR 0.8 09/21/2018    US RENAL  Result Date: 11/11/2018 CLINICAL DATA:  Stage III chronic kidney disease, hypertension, diabetes mellitus EXAM: RENAL / URINARY TRACT ULTRASOUND COMPLETE COMPARISON:  None FINDINGS: Right Kidney: Renal measurements: 7.8 x 4.0 x 4.0 cm = volume: 65.8 mL. Cortical thinning. Increased cortical echogenicity. No mass, hydronephrosis or shadowing calcification Left Kidney: Renal measurements: 8.9 x 4.5 x 4.5 cm = volume: 92.1 mL. Cortical thinning. Increased cortical echogenicity. Simple appearing cyst at LEFT kidney 4.4 x 4.1 x 4.5  cm. No additional mass or hydronephrosis. No shadowing calcification. Bladder: Appears normal for degree of bladder distention. 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 Simple appearing LEFT renal cyst 4.5 cm diameter. Electronically Signed   By: Lavonia Dana M.D.   On: 11/11/2018 09:28    Assessment & Plan:   Sarah [name] was seen today for annual exam, anemia, diabetes, hypertension and hyperlipidemia.  Diagnoses and all orders for this visit:  Essential hypertension- Her blood pressure is adequately well controlled.  Will continue the current regimen. -  Basic metabolic panel; Future -     TSH; Future -     Urinalysis, Routine w reflex microscopic; Future -     diltiazem (TIAZAC) 300 MG 24 hr capsule; TAKE 1 CAPSULE BY MOUTH DAILY BEFORE BREAKFAST -     hydrALAZINE (APRESOLINE) 50 MG tablet; Take 1 tablet (50 mg total) by mouth 3 (three) times daily. -     losartan (COZAAR) 25 MG tablet; Take 0.5 tablets (12.5 mg total) by mouth daily. -     Nebivolol HCl 20 MG TABS; Take 1 tablet (20 mg total) by mouth daily. -     EKG 12-Lead -     Urinalysis, Routine w reflex microscopic -     TSH -     Basic metabolic panel  CRI (chronic renal insufficiency), stage 3 (moderate)- Her renal function is stable. -     Basic metabolic panel; Future -     Urinalysis, Routine w reflex microscopic; Future -     Urinalysis, Routine w reflex microscopic -     Basic metabolic panel  Anemia, chronic disease- HHer H&H are stable. -     CBC with Differential/Platelet; Future -     CBC with Differential/Platelet  Hyperlipidemia with target LDL less than 100- She has achieved her LDL goal and is doing well on the statin. 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  I recommended that she start taking a xanthine oxidase inhibitor. -     Uric acid; Future -     Uric acid -     allopurinol (ZYLOPRIM) 100 MG tablet; Take 1 tablet (100 mg total) by mouth daily.  Routine health maintenance- Exam completed, labs reviewed, vaccines are up-to-date, no cancer screenings are indicated, patient education was given.  Vitamin D deficiency disease- Her vitamin D level is normal now. -     VITAMIN D 25 Hydroxy (Vit-D Deficiency, Fractures); Future -     VITAMIN D 25 Hydroxy (Vit-D Deficiency, Fractures)  Type II diabetes mellitus with manifestations ([place])- Her blood sugar is very well controlled. -     Hemoglobin A1c; Future -     Hemoglobin A1c  Chronic gout due to renal impairment of multiple sites without tophus -     allopurinol (ZYLOPRIM) 100 MG tablet; Take 1 tablet (100 mg total) by mouth daily.   I have discontinued Loney Hering. Ehle's hydrALAZINE and Colchicine. I have also changed her hydrALAZINE, losartan, and Nebivolol HCl. Additionally, I am having her start on allopurinol. Lastly, I am having her maintain her dorzolamide, dorzolamide-timolol, Symbicort, acetaminophen, brimonidine, prednisoLONE acetate, Lotemax SM, Prolensa, zolpidem, atorvastatin, and diltiazem.  Meds ordered this encounter  Medications  . atorvastatin (LIPITOR) 10 MG tablet    Sig: TAKE 1 TABLET(10 MG) BY MOUTH DAILY    Dispense:  90 tablet    Refill:  1  . diltiazem (TIAZAC) 300 MG 24 hr capsule    Sig: TAKE 1 CAPSULE BY MOUTH DAILY BEFORE BREAKFAST    Dispense:  90 capsule    Refill:  1  . hydrALAZINE (APRESOLINE) 50 MG tablet    Sig: Take 1  tablet (50 mg total) by mouth 3 (three) times daily.    Dispense:  270 tablet    Refill:  0  . losartan (COZAAR) 25 MG tablet    Sig: Take 0.5 tablets (12.5 mg total) by mouth daily.    Dispense:  90 tablet    Refill:  0  . Nebivolol HCl 20 MG TABS    Sig: Take 1 tablet (20 mg total) by mouth daily.    Dispense:  90  tablet    Refill:  0  . allopurinol (ZYLOPRIM) 100 MG tablet    Sig: Take 1 tablet (100 mg total) by mouth daily.    Dispense:  90 tablet    Refill:  0     Follow-up: Return in about 6 months (around 08/21/2021).  Scarlette Calico, MD

## 2021-02-18 NOTE — Patient Instructions (Signed)
Health Maintenance, Female Adopting a healthy lifestyle and getting preventive care are important in promoting health and wellness. Ask your health care provider about:  The right schedule for you to have regular tests and exams.  Things you can do on your own to prevent diseases and keep yourself healthy. What should I know about diet, weight, and exercise? Eat a healthy diet  Eat a diet that includes plenty of vegetables, fruits, low-fat dairy products, and lean protein.  Do not eat a lot of foods that are high in solid fats, added sugars, or sodium.   Maintain a healthy weight Body mass index (BMI) is used to identify weight problems. It estimates body fat based on height and weight. Your health care provider can help determine your BMI and help you achieve or maintain a healthy weight. Get regular exercise Get regular exercise. This is one of the most important things you can do for your health. Most adults should:  Exercise for at least 150 minutes each week. The exercise should increase your heart rate and make you sweat (moderate-intensity exercise).  Do strengthening exercises at least twice a week. This is in addition to the moderate-intensity exercise.  Spend less time sitting. Even light physical activity can be beneficial. Watch cholesterol and blood lipids Have your blood tested for lipids and cholesterol at 79 years of age, then have this test every 5 years. Have your cholesterol levels checked more often if:  Your lipid or cholesterol levels are high.  You are older than 79 years of age.  You are at high risk for heart disease. What should I know about cancer screening? Depending on your health history and family history, you may need to have cancer screening at various ages. This may include screening for:  Breast cancer.  Cervical cancer.  Colorectal cancer.  Skin cancer.  Lung cancer. What should I know about heart disease, diabetes, and high blood  pressure? Blood pressure and heart disease  High blood pressure causes heart disease and increases the risk of stroke. This is more likely to develop in people who have high blood pressure readings, are of African descent, or are overweight.  Have your blood pressure checked: ? Every 3-5 years if you are 18-39 years of age. ? Every year if you are 40 years old or older. Diabetes Have regular diabetes screenings. This checks your fasting blood sugar level. Have the screening done:  Once every three years after age 40 if you are at a normal weight and have a low risk for diabetes.  More often and at a younger age if you are overweight or have a high risk for diabetes. What should I know about preventing infection? Hepatitis B If you have a higher risk for hepatitis B, you should be screened for this virus. Talk with your health care provider to find out if you are at risk for hepatitis B infection. Hepatitis C Testing is recommended for:  Everyone born from 1945 through 1965.  Anyone with known risk factors for hepatitis C. Sexually transmitted infections (STIs)  Get screened for STIs, including gonorrhea and chlamydia, if: ? You are sexually active and are younger than 79 years of age. ? You are older than 79 years of age and your health care provider tells you that you are at risk for this type of infection. ? Your sexual activity has changed since you were last screened, and you are at increased risk for chlamydia or gonorrhea. Ask your health care provider   if you are at risk.  Ask your health care provider about whether you are at high risk for HIV. Your health care provider may recommend a prescription medicine to help prevent HIV infection. If you choose to take medicine to prevent HIV, you should first get tested for HIV. You should then be tested every 3 months for as long as you are taking the medicine. Pregnancy  If you are about to stop having your period (premenopausal) and  you may become pregnant, seek counseling before you get pregnant.  Take 400 to 800 micrograms (mcg) of folic acid every day if you become pregnant.  Ask for birth control (contraception) if you want to prevent pregnancy. Osteoporosis and menopause Osteoporosis is a disease in which the bones lose minerals and strength with aging. This can result in bone fractures. If you are 65 years old or older, or if you are at risk for osteoporosis and fractures, ask your health care provider if you should:  Be screened for bone loss.  Take a calcium or vitamin D supplement to lower your risk of fractures.  Be given hormone replacement therapy (HRT) to treat symptoms of menopause. Follow these instructions at home: Lifestyle  Do not use any products that contain nicotine or tobacco, such as cigarettes, e-cigarettes, and chewing tobacco. If you need help quitting, ask your health care provider.  Do not use street drugs.  Do not share needles.  Ask your health care provider for help if you need support or information about quitting drugs. Alcohol use  Do not drink alcohol if: ? Your health care provider tells you not to drink. ? You are pregnant, may be pregnant, or are planning to become pregnant.  If you drink alcohol: ? Limit how much you use to 0-1 drink a day. ? Limit intake if you are breastfeeding.  Be aware of how much alcohol is in your drink. In the [place] one drink equals one 12 oz bottle of beer (355 mL), one 5 oz glass of wine (148 mL), or one 1 oz glass of hard liquor (44 mL). General instructions  Schedule regular health, dental, and eye exams.  Stay current with your vaccines.  Tell your health care provider if: ? You often feel depressed. ? You have ever been abused or do not feel safe at home. Summary  Adopting a healthy lifestyle and getting preventive care are important in promoting health and wellness.  Follow your health care provider's instructions about healthy  diet, exercising, and getting tested or screened for diseases.  Follow your health care provider's instructions on monitoring your cholesterol and blood pressure. This information is not intended to replace advice given to you by your health care provider. Make sure you discuss any questions you have with your health care provider. Document Revised: 11/10/2018 Document Reviewed: 11/10/2018 [employer] Patient Education  2021 [employer]  

## 2021-02-20 ENCOUNTER — Encounter: Payer: Self-pay | Admitting: Internal Medicine

## 2021-02-20 DIAGNOSIS — M1A39X0 Chronic gout due to renal impairment, multiple sites, without tophus (tophi): Secondary | ICD-10-CM | POA: Insufficient documentation

## 2021-02-20 MED ORDER — ALLOPURINOL 100 MG PO TABS
100.0000 mg | ORAL_TABLET | Freq: Every day | ORAL | 0 refills | Status: DC
Start: 2021-02-20 — End: 2021-05-18

## 2021-02-28 NOTE — Progress Notes (Signed)
[facility] Note  03/04/2021     CHIEF COMPLAINT Patient presents for Retina Follow Up   HISTORY OF PRESENT ILLNESS: Sarah [name] is a 80 y.o. female who presents to the clinic today for:   HPI    Retina Follow Up    Patient presents with  Other.  In both eyes.  This started 4 weeks ago.  I, the attending physician,  performed the HPI with the patient and updated documentation appropriately.          Comments    Patient here for 4 weeks retina follow up for CME OU. Patient states vision is ok. No eye pain. Doctor added allopurinol.       Last edited by [name], MD on 03/05/2021  5:08 PM. 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 brimonidine (ALPHAGAN) 0.2 % ophthalmic solution 1 drop 3 (three) times daily.  . Bromfenac Sodium (PROLENSA) 0.07 % SOLN Place 1 drop into both eyes 2 (two) times daily.  . dorzolamide (TRUSOPT) 2 % ophthalmic solution INSTILL 1 DROP INTO OU BID  . dorzolamide-timolol (COSOPT) 22.3-6.8 MG/ML ophthalmic solution INT 1 DROP IN OU BID  . Loteprednol Etabonate (LOTEMAX SM) 0.38 % GEL Place 1 drop into both eyes 2 (two) times daily.  . prednisoLONE acetate (PRED FORTE) 1 % ophthalmic suspension 1 drop 2 (two) times daily.   No current facility-administered medications for this visit. (Ophthalmic Drugs)   Current Outpatient Medications (Other)  Medication Sig  . acetaminophen (TYLENOL) 500 MG tablet Take 500 mg by mouth every 6 (six) hours as needed.  Marland Kitchen allopurinol (ZYLOPRIM) 100 MG tablet Take 1 tablet (100 mg total) by mouth daily.  Marland Kitchen atorvastatin (LIPITOR) 10 MG tablet TAKE 1 TABLET(10 MG) BY MOUTH DAILY  . diltiazem  (TIAZAC) 300 MG 24 hr capsule TAKE 1 CAPSULE BY MOUTH DAILY BEFORE BREAKFAST  . hydrALAZINE (APRESOLINE) 50 MG tablet Take 1 tablet (50 mg total) by mouth 3 (three) times daily.  Marland Kitchen losartan (COZAAR) 25 MG tablet Take 0.5 tablets (12.5 mg total) by mouth daily.  . Nebivolol HCl 20 MG TABS Take 1 tablet (20 mg total) by mouth daily.  . SYMBICORT 160-4.5 MCG/ACT inhaler INHALE 2 PUFFS INTO THE LUNGS TWICE DAILY  . zolpidem (AMBIEN) 5 MG tablet Take 1 tablet (5 mg total) by mouth at bedtime as needed for sleep.   No current facility-administered medications for this visit. (Other)      REVIEW OF SYSTEMS: ROS    Positive for: Neurological, Musculoskeletal, Eyes, Respiratory   Negative for: Constitutional, Gastrointestinal, Skin, Genitourinary, HENT, Endocrine, Cardiovascular, Psychiatric, Allergic/Imm, Heme/Lymph   Last edited by Theodore Demark, COA on 03/04/2021  2:12 PM. (History)       ALLERGIES Allergies  Allergen Reactions  . Lisinopril     REACTION: Lightheaded "sick"    PAST MEDICAL HISTORY Past Medical History:  Diagnosis Date  . Allergic rhinitis   . Arthritis   . Chronic back pain   . Contact dermatitis and other eczema, due to unspecified cause   . Diabetes mellitus without complication ([place])   . Dysrhythmia    hx of heart beating fast   .  Glaucoma    POAG OU  . History of sinusitis   . Hx of adenomatous colonic polyps 10/17/2014  . Hypertension   . Hypertensive retinopathy    OU  . Peripheral vascular disease (HCC)    hx of DVT - left leg   . Personal history of unspecified circulatory disease   . Plantar fascial fibromatosis   . Shortness of breath    with exertion    Past Surgical History:  Procedure Laterality Date  . ABDOMINAL HYSTERECTOMY    . CATARACT EXTRACTION Bilateral 2018  . EYE SURGERY Bilateral 2018   Cat Sx  . FLEXIBLE SIGMOIDOSCOPY    . FOOT SURGERY Bilateral   . History of Lumbar fusion     '87  . SHOULDER ARTHROSCOPY WITH ROTATOR  CUFF REPAIR AND SUBACROMIAL DECOMPRESSION Left 01/14/2013   Procedure: Left Shoulder Arthroscopy with Debridement, Subacromial Decompression,rotator cuff repair;  Surgeon: Mcarthur Rossetti, MD;  Location: [facility];  Service: Orthopedics;  Laterality: Left;  Left Shoulder Arthroscopy with Debridement, Subacromial Decompression    FAMILY HISTORY Family History  Problem Relation Age of Onset  . Leukemia Mother   . Brain cancer Father        mets  . Throat cancer Brother        x 2 brothers    SOCIAL HISTORY Social History   Tobacco Use  . Smoking status: Never Smoker  . Smokeless tobacco: Never Used  Vaping Use  . Vaping Use: Never used  Substance Use Topics  . Alcohol use: No  . Drug use: No         OPHTHALMIC EXAM:  Base Eye Exam    Visual Acuity (Snellen - Linear)      Right Left   Dist [place] 20/25 -1 20/50 -2   Dist ph [place] NI 20/25 -2       Tonometry (Tonopen, 2:09 PM)      Right Left   Pressure 21 20       Pupils      Dark Light Shape React APD   Right 3 2 Round Minimal None   Left 3 2 Round Minimal None       Visual Fields (Counting fingers)      Left Right    Full Full       Extraocular Movement      Right Left    Full, Ortho Full, Ortho       Neuro/Psych    Oriented x3: Yes   Mood/Affect: Normal       Dilation    Both eyes: 1.0% Mydriacyl, 2.5% Phenylephrine @ 2:08 PM        Slit Lamp and Fundus Exam    Slit Lamp Exam      Right Left   Lids/Lashes Dermatochalasis - upper lid Dermatochalasis - upper lid   Conjunctiva/Sclera Melanosis, trace Injection Melanosis, 2+ Injection   Cornea arcus, well healed temporal cataract wounds, trace Punctate epithelial erosions, early band K nasal and temporal arcus, well healed temporal cataract wounds, trace Punctate epithelial erosions, early band K nasal and temporal   Anterior Chamber Deep, 0.5+pigment Deep, trace pigment   Iris Round and dilated, mild atrophy Round and dilated, mild, diffuse  atrophy   Lens PC IOL in good position PC IOL in good position   Vitreous Vitreous syneresis, +pigment, mild vitreous condensations Vitreous syneresis, fine pigment, vitreous condensations       Fundus Exam      Right Left   Disc  Pink and Sharp, PPA/PPP Pink and Sharp, Compact, temporal PPA   C/D Ratio 0.5 0.5   Macula Flat, good foveal reflex, RPE mottling, drusen temporal macula, No heme or edema Flat, Blunted foveal reflex, central cystic changes - improved, drusen, RPE mottling, no heme   Vessels attenuated, Tortuous, mild Copper wiring attenuated, mild tortuousity, mild Copper wiring   Periphery Attached, no heme    Attached, trace peripheral MA             IMAGING AND PROCEDURES  Imaging and Procedures for 03/04/2021  OCT, Retina - OU - Both Eyes       Right Eye Quality was good. Central Foveal Thickness: 292. Progression has been stable. Findings include no SRF, myopic contour, no IRF, normal foveal contour (stable improvement in cystic changes).   Left Eye Quality was good. Central Foveal Thickness: 287. Progression has improved. Findings include abnormal foveal contour, intraretinal fluid, no SRF, myopic contour (Interval resolution of central CME).   Notes *Images captured and stored on drive  Diagnosis / Impression:  +CME OU (OS > OD) OD: stable improvement in cystic changes OS: Interval resolution of central CME  Clinical management:  See below  Abbreviations: NFP - Normal foveal profile. CME - cystoid macular edema. PED - pigment epithelial detachment. IRF - intraretinal fluid. SRF - subretinal fluid. EZ - ellipsoid zone. ERM - epiretinal membrane. ORA - outer retinal atrophy. ORT - outer retinal tubulation. SRHM - subretinal hyper-reflective material. IRHM - intraretinal hyper-reflective material                 ASSESSMENT/PLAN:    ICD-10-CM   1. 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 Retinal  edema  H35.81 OCT, Retina - OU - Both Eyes  4. Essential hypertension  I10   5. Hypertensive retinopathy of both eyes  H35.033   6. Pseudophakia, both eyes  Z96.1   7. Primary open angle glaucoma (POAG) of both eyes, mild stage  H40.1131     1-3. CME OU - [place] / posterior uveitis OU  - pt with history of chronic CME post cataract surgery OU in 2018  - history of improvement on topical steroid and NSAID, but recurrence w/ tapering; also history of steroid response  - FA 01.24.22 shows hyperfluorescence of disc and perifoveal hyperfluorescence OU consistent with [place] CME; also shows focal peripheral hyperfluorescence OU consistent with posterior uveitis / inflammatory component  - currently using Lotemax SM and Prolensa TID OU  - IOP 21,20  - BCVA 20/25 OD, OS: improved to 20/25 from 20/40  - OCT shows OD: stable improvement in CME/cystic changes; OS: Interval resolution of central CME  - dec Lotemax SM and prolensa to BID OU -- may need to be on chronic low dose Lotemax and prolensa if CME recurs  - f/u 4-6 weeks, DFE, OCT  4,5. Hypertensive retinopathy OU - discussed importance of tight BP control - monitor  6. Pseudophakia OU  - s/p CE/IOL OU (2018)  - IOLs in good position  - chronic CME as above  - monitor  7. POAG OU  - under the expert manage of Dr. Kathlen Mody  - mild stage, s/p SLT OU x2, s/p goniotomy OU  - history of steroid response  - IOP 21,20 today  - on [place] qhs OU, Cosopt and Brim TID OU   Ophthalmic Meds Ordered this visit:  Meds ordered this encounter  Medications  . Bromfenac  Sodium (PROLENSA) 0.07 % SOLN    Sig: Place 1 drop into both eyes 2 (two) times daily.    Dispense:  6 mL    Refill:  3  . Loteprednol Etabonate (LOTEMAX SM) 0.38 % GEL    Sig: Place 1 drop into both eyes 2 (two) times daily.    Dispense:  5 g    Refill:  5      Return for f/u 4-6 weeks, CME OU, DFE, OCT.  There are no Patient Instructions on file for this  visit.  This document serves as a record of services personally performed by Gardiner Sleeper, MD, PhD. It was created on their behalf by Leeann Must, [place], an ophthalmic technician. The creation of this record is the provider's dictation and/or activities during the visit.    Electronically signed by: Leeann Must, COA 03.31.2022 5:13 PM   This document serves as a record of services personally performed by Gardiner Sleeper, MD, PhD. It was created on their behalf by San Jetty. [place], OA an ophthalmic technician. The creation of this record is the provider's dictation and/or activities during the visit.    Electronically signed by: San Jetty. [place], [place] 04.04.2022 5:13 PM  Gardiner Sleeper, M.D., Ph.D. Diseases & Surgery of the Retina and [place] 03/04/2021    I have reviewed the above documentation for accuracy and completeness, and I agree with the above. Gardiner Sleeper, M.D., Ph.D. 03/05/21 5:13 PM  Abbreviations: M myopia (nearsighted); A astigmatism; H hyperopia (farsighted); P presbyopia; Mrx spectacle prescription;  CTL contact lenses; OD right eye; OS left eye; OU both eyes  XT exotropia; ET esotropia; PEK punctate epithelial keratitis; PEE punctate epithelial erosions; DES dry eye syndrome; MGD meibomian gland dysfunction; ATs artificial tears; PFAT's preservative free artificial tears; [place] nuclear sclerotic cataract; PSC posterior subcapsular cataract; ERM epi-retinal membrane; PVD posterior vitreous detachment; RD retinal detachment; DM diabetes mellitus; DR diabetic retinopathy; NPDR non-proliferative diabetic retinopathy; PDR proliferative diabetic retinopathy; CSME clinically significant macular edema; DME diabetic macular edema; dbh dot blot hemorrhages; CWS cotton wool spot; POAG primary open angle glaucoma; C/D cup-to-disc ratio; HVF humphrey visual field; GVF goldmann visual field; OCT optical coherence tomography; IOP intraocular pressure; BRVO  Branch retinal vein occlusion; CRVO central retinal vein occlusion; CRAO central retinal artery occlusion; BRAO branch retinal artery occlusion; RT retinal tear; SB scleral buckle; PPV pars plana vitrectomy; VH Vitreous hemorrhage; PRP panretinal laser photocoagulation; IVK intravitreal kenalog; VMT vitreomacular traction; MH Macular hole;  NVD neovascularization of the disc; NVE neovascularization elsewhere; AREDS age related eye disease study; ARMD age related macular degeneration; POAG primary open angle glaucoma; EBMD epithelial/anterior basement membrane dystrophy; ACIOL anterior chamber intraocular lens; IOL intraocular lens; PCIOL posterior chamber intraocular lens; Phaco/IOL phacoemulsification with intraocular lens placement; [place] photorefractive keratectomy; LASIK laser assisted in situ keratomileusis; HTN hypertension; DM diabetes mellitus; COPD chronic obstructive pulmonary disease

## 2021-03-04 ENCOUNTER — Other Ambulatory Visit: Payer: Self-pay

## 2021-03-04 ENCOUNTER — Ambulatory Visit (INDEPENDENT_AMBULATORY_CARE_PROVIDER_SITE_OTHER): Payer: Medicare Other | Admitting: Ophthalmology

## 2021-03-04 ENCOUNTER — Encounter (INDEPENDENT_AMBULATORY_CARE_PROVIDER_SITE_OTHER): Payer: Self-pay | Admitting: Ophthalmology

## 2021-03-04 DIAGNOSIS — H401131 Primary open-angle glaucoma, bilateral, mild stage: Secondary | ICD-10-CM

## 2021-03-04 DIAGNOSIS — H35353 Cystoid macular degeneration, bilateral: Secondary | ICD-10-CM

## 2021-03-04 DIAGNOSIS — H3093 Unspecified chorioretinal inflammation, bilateral: Secondary | ICD-10-CM | POA: Diagnosis not present

## 2021-03-04 DIAGNOSIS — I10 Essential (primary) hypertension: Secondary | ICD-10-CM | POA: Diagnosis not present

## 2021-03-04 DIAGNOSIS — Z961 Presence of intraocular lens: Secondary | ICD-10-CM

## 2021-03-04 DIAGNOSIS — H35033 Hypertensive retinopathy, bilateral: Secondary | ICD-10-CM

## 2021-03-04 DIAGNOSIS — H3581 Retinal edema: Secondary | ICD-10-CM | POA: Diagnosis not present

## 2021-03-04 MED ORDER — PROLENSA 0.07 % OP SOLN: 1.0000 [drp] | Freq: Two times a day (BID) | OPHTHALMIC | 3 refills | Status: DC

## 2021-03-04 MED ORDER — LOTEMAX SM 0.38 % OP GEL: 1.0000 [drp] | Freq: Two times a day (BID) | OPHTHALMIC | 5 refills | Status: DC

## 2021-03-05 ENCOUNTER — Encounter (INDEPENDENT_AMBULATORY_CARE_PROVIDER_SITE_OTHER): Payer: Self-pay | Admitting: Ophthalmology

## 2021-04-05 NOTE — Progress Notes (Signed)
[facility] Note  04/08/2021     CHIEF COMPLAINT Patient presents for Retina Follow Up   HISTORY OF PRESENT ILLNESS: Sarah [name] is a 80 y.o. female who presents to the clinic today for:   HPI    Retina Follow Up    Patient presents with  Other.  In both eyes.  Duration of 5 weeks.  Since onset it is stable.  I, the attending physician,  performed the HPI with the patient and updated documentation appropriately.          Comments    5 week follow up CME OU- Her allergies have really been bothering her and her eyes.  She is using OTC oral allergy medication. Using Brimonidine TID OU, Dorz/Timolol TID OU, Lotemax TID OU, Prolensa TID OU, and rocklatan qhs OU. +GOUT pill added, unsure name       Last edited by [name], MD on 04/10/2021  9:09 AM. 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 brimonidine (ALPHAGAN) 0.2 % ophthalmic solution 1 drop 3 (three) times daily.  . Bromfenac Sodium (PROLENSA) 0.07 % SOLN Place 1 drop into both eyes 2 (two) times daily.  . dorzolamide-timolol (COSOPT) 22.3-6.8 MG/ML ophthalmic solution INT 1 DROP IN OU BID  . Loteprednol Etabonate (LOTEMAX SM) 0.38 % GEL Place 1 drop into both eyes 2 (two) times daily.  . dorzolamide (TRUSOPT) 2 % ophthalmic solution INSTILL 1 DROP INTO OU BID (Patient not taking: Reported on 04/08/2021)  . prednisoLONE acetate (PRED FORTE) 1 % ophthalmic suspension 1 drop 2 (two) times daily. (Patient not taking: Reported on 04/08/2021)   No current facility-administered medications for this visit. (Ophthalmic Drugs)   Current Outpatient Medications (Other)  Medication Sig  . acetaminophen  (TYLENOL) 500 MG tablet Take 500 mg by mouth every 6 (six) hours as needed.  Marland Kitchen allopurinol (ZYLOPRIM) 100 MG tablet Take 1 tablet (100 mg total) by mouth daily.  Marland Kitchen atorvastatin (LIPITOR) 10 MG tablet TAKE 1 TABLET(10 MG) BY MOUTH DAILY  . diltiazem (TIAZAC) 300 MG 24 hr capsule TAKE 1 CAPSULE BY MOUTH DAILY BEFORE BREAKFAST  . hydrALAZINE (APRESOLINE) 50 MG tablet Take 1 tablet (50 mg total) by mouth 3 (three) times daily.  Marland Kitchen losartan (COZAAR) 25 MG tablet Take 0.5 tablets (12.5 mg total) by mouth daily.  . Nebivolol HCl 20 MG TABS Take 1 tablet (20 mg total) by mouth daily.  . SYMBICORT 160-4.5 MCG/ACT inhaler INHALE 2 PUFFS INTO THE LUNGS TWICE DAILY  . zolpidem (AMBIEN) 5 MG tablet Take 1 tablet (5 mg total) by mouth at bedtime as needed for sleep.   No current facility-administered medications for this visit. (Other)      REVIEW OF SYSTEMS: ROS    Positive for: Neurological, Musculoskeletal, Eyes, Respiratory   Negative for: Constitutional, Gastrointestinal, Skin, Genitourinary, HENT, Endocrine, Cardiovascular, Psychiatric, Allergic/Imm, Heme/Lymph   Last edited by Leonie Douglas, COA on 04/08/2021  1:28 PM. (History)       ALLERGIES Allergies  Allergen Reactions  . Lisinopril     REACTION: Lightheaded "sick"    PAST MEDICAL HISTORY Past Medical History:  Diagnosis Date  . Allergic rhinitis   . Arthritis   .  Chronic back pain   . Contact dermatitis and other eczema, due to unspecified cause   . Diabetes mellitus without complication ([place])   . Dysrhythmia    hx of heart beating fast   . Glaucoma    POAG OU  . History of sinusitis   . Hx of adenomatous colonic polyps 10/17/2014  . Hypertension   . Hypertensive retinopathy    OU  . Peripheral vascular disease ([facility])    hx of DVT - left leg   . Personal history of unspecified circulatory disease   . Plantar fascial fibromatosis   . Shortness of breath    with exertion    Past Surgical History:  Procedure Laterality  Date  . ABDOMINAL HYSTERECTOMY    . CATARACT EXTRACTION Bilateral 2018  . EYE SURGERY Bilateral 2018   Cat Sx  . FLEXIBLE SIGMOIDOSCOPY    . FOOT SURGERY Bilateral   . History of Lumbar fusion     '87  . SHOULDER ARTHROSCOPY WITH ROTATOR CUFF REPAIR AND SUBACROMIAL DECOMPRESSION Left 01/14/2013   Procedure: Left Shoulder Arthroscopy with Debridement, Subacromial Decompression,rotator cuff repair;  Surgeon: Mcarthur Rossetti, MD;  Location: [facility];  Service: Orthopedics;  Laterality: Left;  Left Shoulder Arthroscopy with Debridement, Subacromial Decompression    FAMILY HISTORY Family History  Problem Relation Age of Onset  . Leukemia Mother   . Brain cancer Father        mets  . Throat cancer Brother        x 2 brothers    SOCIAL HISTORY Social History   Tobacco Use  . Smoking status: Never Smoker  . Smokeless tobacco: Never Used  Vaping Use  . Vaping Use: Never used  Substance Use Topics  . Alcohol use: No  . Drug use: No         OPHTHALMIC EXAM:  Base Eye Exam    Visual Acuity (Snellen - Linear)      Right Left   Dist [place] 20/25 20/50 -1   Dist ph [place]  20/30       Tonometry (Tonopen, 1:35 PM)      Right Left   Pressure 20 17       Pupils      Dark Light Shape React APD   Right 3 2 Round Minimal None   Left 3 2 Round Minimal None       Visual Fields (Counting fingers)      Left Right    Full Full       Extraocular Movement      Right Left    Full Full       Neuro/Psych    Oriented x3: Yes   Mood/Affect: Normal       Dilation    Both eyes: 1.0% Mydriacyl, 2.5% Phenylephrine @ 1:35 PM        Slit Lamp and Fundus Exam    Slit Lamp Exam      Right Left   Lids/Lashes Dermatochalasis - upper lid Dermatochalasis - upper lid   Conjunctiva/Sclera Melanosis, 1+Injection Melanosis, 1+ Injection   Cornea arcus, well healed temporal cataract wounds, trace Punctate epithelial erosions, early band K nasal and temporal arcus, well healed temporal  cataract wounds, trace Punctate epithelial erosions, early band K nasal and temporal   Anterior Chamber Deep, 1+fine pigment Deep, 1+fine pigment   Iris Round and dilated, mild atrophy Round and dilated, mild, diffuse atrophy   Lens PC IOL in good position PC IOL in good  position   Vitreous Vitreous syneresis, +pigment, mild vitreous condensations Vitreous syneresis, fine pigment, vitreous condensations       Fundus Exam      Right Left   Disc Pink and Sharp, PPA/PPP Pink and Sharp, Compact, temporal PPA   C/D Ratio 0.5 0.5   Macula Flat, good foveal reflex, RPE mottling, drusen temporal macula, No heme or edema -- CME stably resolved Flat, Blunted foveal reflex, central cystic changes - stably improved, drusen, RPE mottling, no heme   Vessels attenuated, Tortuous, mild Copper wiring attenuated, mild tortuousity, mild Copper wiring   Periphery Attached, no heme    Attached, trace peripheral MA             IMAGING AND PROCEDURES  Imaging and Procedures for 04/08/2021  OCT, Retina - OU - Both Eyes       Right Eye Quality was good. Central Foveal Thickness: 278. Progression has been stable. Findings include no SRF, myopic contour, no IRF, normal foveal contour (stable improvement in cystic changes).   Left Eye Quality was good. Central Foveal Thickness: 281. Progression has been stable. Findings include abnormal foveal contour, intraretinal fluid, no SRF, myopic contour (Stable resolution of IRF).   Notes *Images captured and stored on drive  Diagnosis / Impression:  +CME OU (OS > OD) OD: stable improvement in cystic changes OS: Stable resolution of IRF  Clinical management:  See below  Abbreviations: NFP - Normal foveal profile. CME - cystoid macular edema. PED - pigment epithelial detachment. IRF - intraretinal fluid. SRF - subretinal fluid. EZ - ellipsoid zone. ERM - epiretinal membrane. ORA - outer retinal atrophy. ORT - outer retinal tubulation. SRHM - subretinal  hyper-reflective material. IRHM - intraretinal hyper-reflective material                 ASSESSMENT/PLAN:    ICD-10-CM   1. 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 Retinal edema  H35.81 OCT, Retina - OU - Both Eyes  4. Essential hypertension  I10   5. Hypertensive retinopathy of both eyes  H35.033   6. Pseudophakia, both eyes  Z96.1   7. Primary open angle glaucoma (POAG) of both eyes, mild stage  H40.1131     1-3. CME OU - [place] / posterior uveitis OU  - pt with history of chronic CME post cataract surgery OU in 2018  - history of improvement on topical steroid and NSAID, but recurrence w/ tapering; also history of steroid response  - FA 01.24.22 shows hyperfluorescence of disc and perifoveal hyperfluorescence OU consistent with [place] CME; also shows focal peripheral hyperfluorescence OU consistent with posterior uveitis / inflammatory component  - currently using Lotemax SM and Prolensa TID OU  - IOP 20,17  - BCVA 20/25 OD, OS: decreased to 20/30 from 20/25  - OCT shows OD: stable improvement in cystic changes; OS: Stable resolution of IRF  - dec Lotemax SM and prolensa to BID OU -- may need to be on chronic low dose Lotemax and prolensa if CME recurs  - f/u 4-6 weeks, DFE, OCT  4,5. Hypertensive retinopathy OU - discussed importance of tight BP control - monitor  6. Pseudophakia OU  - s/p CE/IOL OU (2018)  - IOLs in good position  - chronic CME as above  - monitor  7. POAG OU  - under the expert manage of Dr. Kathlen Mody  - mild stage, s/p SLT OU x2, s/p goniotomy OU  -  history of steroid response  - IOP 20,17 today  - on rocklatan qhs OU, and cosopt and brimonidine tid OU   Ophthalmic Meds Ordered this visit:  No orders of the defined types were placed in this encounter.     Return for f/u 4-6 weeks, CME OU, DFE, OCT.  There are no Patient Instructions on file for this visit.  This document  serves as a record of services personally performed by Gardiner Sleeper, MD, PhD. It was created on their behalf by Roselee Nova, COMT. The creation of this record is the provider's dictation and/or activities during the visit.  Electronically signed by: Roselee Nova, COMT 04/10/21 9:13 AM   This document serves as a record of services personally performed by Gardiner Sleeper, MD, PhD. It was created on their behalf by San Jetty. Owens Shark, OA an ophthalmic technician. The creation of this record is the provider's dictation and/or activities during the visit.    Electronically signed by: San Jetty. Gabrielle, [place] 05.09.2022 9:13 AM  Gardiner Sleeper, M.D., Ph.D. Diseases & Surgery of the Retina and Vitreous [facility]  I have reviewed the above documentation for accuracy and completeness, and I agree with the above. Gardiner Sleeper, M.D., Ph.D. 04/10/21 9:13 AM   Abbreviations: M myopia (nearsighted); A astigmatism; H hyperopia (farsighted); P presbyopia; Mrx spectacle prescription;  CTL contact lenses; OD right eye; OS left eye; OU both eyes  XT exotropia; ET esotropia; PEK punctate epithelial keratitis; PEE punctate epithelial erosions; DES dry eye syndrome; MGD meibomian gland dysfunction; ATs artificial tears; PFAT's preservative free artificial tears; [place] nuclear sclerotic cataract; PSC posterior subcapsular cataract; ERM epi-retinal membrane; PVD posterior vitreous detachment; RD retinal detachment; DM diabetes mellitus; DR diabetic retinopathy; NPDR non-proliferative diabetic retinopathy; PDR proliferative diabetic retinopathy; CSME clinically significant macular edema; DME diabetic macular edema; dbh dot blot hemorrhages; CWS cotton wool spot; POAG primary open angle glaucoma; C/D cup-to-disc ratio; HVF humphrey visual field; GVF goldmann visual field; OCT optical coherence tomography; IOP intraocular pressure; BRVO Branch retinal vein occlusion; CRVO central retinal vein occlusion;  CRAO central retinal artery occlusion; BRAO branch retinal artery occlusion; RT retinal tear; SB scleral buckle; PPV pars plana vitrectomy; VH Vitreous hemorrhage; PRP panretinal laser photocoagulation; IVK intravitreal kenalog; VMT vitreomacular traction; MH Macular hole;  NVD neovascularization of the disc; NVE neovascularization elsewhere; AREDS age related eye disease study; ARMD age related macular degeneration; POAG primary open angle glaucoma; EBMD epithelial/anterior basement membrane dystrophy; ACIOL anterior chamber intraocular lens; IOL intraocular lens; PCIOL posterior chamber intraocular lens; Phaco/IOL phacoemulsification with intraocular lens placement; [place] photorefractive keratectomy; LASIK laser assisted in situ keratomileusis; HTN hypertension; DM diabetes mellitus; COPD chronic obstructive pulmonary disease

## 2021-04-08 ENCOUNTER — Ambulatory Visit (INDEPENDENT_AMBULATORY_CARE_PROVIDER_SITE_OTHER): Payer: Medicare Other | Admitting: Ophthalmology

## 2021-04-08 ENCOUNTER — Other Ambulatory Visit: Payer: Self-pay

## 2021-04-08 DIAGNOSIS — H35033 Hypertensive retinopathy, bilateral: Secondary | ICD-10-CM

## 2021-04-08 DIAGNOSIS — H3581 Retinal edema: Secondary | ICD-10-CM | POA: Diagnosis not present

## 2021-04-08 DIAGNOSIS — H3093 Unspecified chorioretinal inflammation, bilateral: Secondary | ICD-10-CM | POA: Diagnosis not present

## 2021-04-08 DIAGNOSIS — H35353 Cystoid macular degeneration, bilateral: Secondary | ICD-10-CM | POA: Diagnosis not present

## 2021-04-08 DIAGNOSIS — H401131 Primary open-angle glaucoma, bilateral, mild stage: Secondary | ICD-10-CM

## 2021-04-08 DIAGNOSIS — I10 Essential (primary) hypertension: Secondary | ICD-10-CM | POA: Diagnosis not present

## 2021-04-08 DIAGNOSIS — Z961 Presence of intraocular lens: Secondary | ICD-10-CM

## 2021-04-10 ENCOUNTER — Encounter (INDEPENDENT_AMBULATORY_CARE_PROVIDER_SITE_OTHER): Payer: Self-pay | Admitting: Ophthalmology

## 2021-04-18 ENCOUNTER — Other Ambulatory Visit: Payer: Self-pay | Admitting: Internal Medicine

## 2021-04-18 DIAGNOSIS — F409 Phobic anxiety disorder, unspecified: Secondary | ICD-10-CM

## 2021-04-18 DIAGNOSIS — F5105 Insomnia due to other mental disorder: Secondary | ICD-10-CM

## 2021-04-25 ENCOUNTER — Other Ambulatory Visit: Payer: Self-pay | Admitting: Internal Medicine

## 2021-04-25 DIAGNOSIS — I10 Essential (primary) hypertension: Secondary | ICD-10-CM

## 2021-04-26 ENCOUNTER — Other Ambulatory Visit: Payer: Self-pay

## 2021-04-26 ENCOUNTER — Telehealth: Payer: Medicare Other

## 2021-04-26 ENCOUNTER — Ambulatory Visit (INDEPENDENT_AMBULATORY_CARE_PROVIDER_SITE_OTHER): Payer: Medicare Other | Admitting: Pharmacist

## 2021-04-26 DIAGNOSIS — N183 Chronic kidney disease, stage 3 unspecified: Secondary | ICD-10-CM

## 2021-04-26 DIAGNOSIS — F5105 Insomnia due to other mental disorder: Secondary | ICD-10-CM

## 2021-04-26 DIAGNOSIS — F409 Phobic anxiety disorder, unspecified: Secondary | ICD-10-CM

## 2021-04-26 DIAGNOSIS — I10 Essential (primary) hypertension: Secondary | ICD-10-CM | POA: Diagnosis not present

## 2021-04-26 DIAGNOSIS — E785 Hyperlipidemia, unspecified: Secondary | ICD-10-CM

## 2021-04-26 DIAGNOSIS — M1A39X0 Chronic gout due to renal impairment, multiple sites, without tophus (tophi): Secondary | ICD-10-CM

## 2021-04-26 NOTE — Progress Notes (Signed)
Chronic Care Management Pharmacy Note  04/26/2021 Name:  Sarah [name] MRN:  [id number] DOB:  March 15, 1941  Subjective: Sarah [name] is an 80 y.o. year old female who is a primary patient of [name], MD.  The CCM team was consulted for assistance with disease management and care coordination needs.    Engaged with patient by telephone for follow up visit in response to provider referral for pharmacy case management and/or care coordination services.   Consent to Services:  The patient was given information about Chronic Care Management services, agreed to services, and gave verbal consent prior to initiation of services.  Please see initial visit note for detailed documentation.   Patient Care Team: [name], MD as PCP - General (Internal Medicine) Regal, Tamala Fothergill, DPM as Consulting Physician (Podiatry) Claudia Desanctis, MD as Consulting Physician (Internal Medicine) Charlton Haws, [facility] as Pharmacist (Pharmacist)  Recent office visits: 02/18/21 Dr Ronnald Ramp OV: CPX. Uric acid > 8, started allopurinol 100 mg. Other labs stable.   Recent consult visits: 04/08/21 Dr Coralyn Pear (ophthalmology): f/u CME  02/11/21 Dr (nephrology): f/u CKD. Increase losartan to 25 mg. Cont hydralazine 50 TID.  Hospital visits: None in previous 6 months  Objective:  Lab Results  Component Value Date   CREATININE 1.59 (H) 02/18/2021   BUN 37 (H) 02/18/2021   GFR 30.83 (L) 02/18/2021   GFRNONAA 54 (L) 01/07/2013   GFRAA 63 (L) 01/07/2013   NA 141 02/18/2021   K 4.5 02/18/2021   CALCIUM 10.0 02/18/2021   CO2 20 02/18/2021   GLUCOSE 84 02/18/2021    Lab Results  Component Value Date/Time   HGBA1C 5.7 02/18/2021 02:23 PM   HGBA1C 5.8 04/24/2020 02:20 PM   GFR 30.83 (L) 02/18/2021 02:23 PM   GFR 36.90 (L) 04/24/2020 02:20 PM   MICROALBUR 0.8 09/21/2018 03:14 PM   MICROALBUR 2.0 (H) 05/21/2017 08:52 AM    Last diabetic Eye exam:  Lab Results  Component Value Date/Time   HMDIABEYEEXA  No Retinopathy 01/20/2019 12:00 AM    Last diabetic Foot exam: No results found for: HMDIABFOOTEX   Lab Results  Component Value Date   CHOL 144 02/18/2021   HDL 73.50 02/18/2021   LDLCALC 60 02/18/2021   TRIG 53.0 02/18/2021   CHOLHDL 2 02/18/2021    Hepatic Function Latest Ref Rng & Units 02/18/2021 10/10/2019 09/21/2018  Total Protein 6.0 - 8.3 g/dL 7.8 7.6 7.9  Albumin 3.5 - 5.2 g/dL 4.6 4.5 4.6  AST 0 - 37 U/L 15 16 14   ALT 0 - 35 U/L 13 16 11   Alk Phosphatase 39 - 117 U/L 81 78 84  Total Bilirubin 0.2 - 1.2 mg/dL 0.4 0.4 0.3  Bilirubin, Direct 0.0 - 0.3 mg/dL 0.0 0.0 -    Lab Results  Component Value Date/Time   TSH 1.68 02/18/2021 02:23 PM   TSH 2.55 10/10/2019 02:43 PM    CBC Latest Ref Rng & Units 02/18/2021 04/24/2020 10/10/2019  WBC 4.0 - 10.5 K/uL 5.6 5.6 5.9  Hemoglobin 12.0 - 15.0 g/dL 11.2(L) 11.3(L) 11.6(L)  Hematocrit 36.0 - 46.0 % 33.9(L) 33.8(L) 35.5(L)  Platelets 150.0 - 400.0 K/uL 206.0 238.0 253.0    Lab Results  Component Value Date/Time   VD25OH 50.53 02/18/2021 02:23 PM   VD25OH 89.19 10/10/2019 02:43 PM    Clinical ASCVD: No  The 10-year ASCVD risk score Mikey Bussing DC Jr., et al., 2013) is: 40.4%   Values used to calculate the score:  Age: 64 years     Sex: Female     Is Non-Hispanic African American: Yes     Diabetic: Yes     Tobacco smoker: No     Systolic Blood Pressure: 480 mmHg     Is BP treated: Yes     HDL Cholesterol: 73.5 mg/dL     Total Cholesterol: 144 mg/dL    Depression screen [facility] 2/9 01/21/2021 10/24/2019 09/22/2018  Decreased Interest 0 0 0  Down, Depressed, Hopeless 0 1 0  PHQ - 2 Score 0 1 0  Altered sleeping - - -  Tired, decreased energy - - -  Change in appetite - - -  Feeling bad or failure about yourself  - - -  Trouble concentrating - - -  Moving slowly or fidgety/restless - - -  Suicidal thoughts - - -  PHQ-9 Score - - -  Difficult doing work/chores - - -     Social History   Tobacco Use  Smoking Status  Never Smoker  Smokeless Tobacco Never Used   BP Readings from Last 3 Encounters:  02/18/21 (!) 150/70  01/21/21 130/60  06/19/20 138/86   Pulse Readings from Last 3 Encounters:  02/18/21 74  01/21/21 67  06/19/20 71   Wt Readings from Last 3 Encounters:  02/18/21 144 lb (65.3 kg)  01/21/21 145 lb 12.8 oz (66.1 kg)  06/19/20 138 lb (62.6 kg)   BMI Readings from Last 3 Encounters:  02/18/21 26.34 kg/m  01/21/21 26.67 kg/m  06/19/20 25.24 kg/m    Assessment/Interventions: Review of patient past medical history, allergies, medications, health status, including review of consultants reports, laboratory and other test data, was performed as part of comprehensive evaluation and provision of chronic care management services.   SDOH:  (Social Determinants of Health) assessments and interventions performed: Yes  SDOH Screenings   Alcohol Screen: Low Risk   . Last Alcohol Screening Score (AUDIT): 0  Depression (PHQ2-9): Low Risk   . PHQ-2 Score: 0  Financial Resource Strain: Medium Risk  . Difficulty of Paying Living Expenses: Somewhat hard  Food Insecurity: No Food Insecurity  . Worried About Charity fundraiser in the Last Year: Never true  . Ran Out of Food in the Last Year: Never true  Housing: Low Risk   . Last Housing Risk Score: 0  Physical Activity: Sufficiently Active  . Days of Exercise per Week: 5 days  . Minutes of Exercise per Session: 30 min  Social Connections: Socially Integrated  . Frequency of Communication with Friends and Family: More than three times a week  . Frequency of Social Gatherings with Friends and Family: Once a week  . Attends Religious Services: More than 4 times per year  . Active Member of Clubs or Organizations: No  . Attends Archivist Meetings: More than 4 times per year  . Marital Status: Married  Stress: No Stress Concern Present  . Feeling of Stress : Not at all  Tobacco Use: Low Risk   . Smoking Tobacco Use: Never  Smoker  . Smokeless Tobacco Use: Never Used  Transportation Needs: No Transportation Needs  . Lack of Transportation (Medical): No  . Lack of Transportation (Non-Medical): No    CCM Care Plan  Allergies  Allergen Reactions  . Lisinopril     REACTION: Lightheaded "sick"    Medications Reviewed Today    Reviewed by Charlton Haws, [facility] (Pharmacist) on 04/26/21 at 0944  Med List Status: <None>  Medication  Order Taking? Sig Documenting Provider Last Dose Status Informant  acetaminophen (TYLENOL) 500 MG tablet [id number] Yes Take 500 mg by mouth every 6 (six) hours as needed. [provider] Taking Active   allopurinol (ZYLOPRIM) 100 MG tablet [id number] Yes Take 1 tablet (100 mg total) by mouth daily. [name], MD Taking Active   atorvastatin (LIPITOR) 10 MG tablet [id number] Yes TAKE 1 TABLET(10 MG) BY MOUTH DAILY [name], MD Taking Active   brimonidine [facility]) 0.2 % ophthalmic solution [id number] Yes 1 drop 3 (three) times daily. [provider] Taking Active   Bromfenac Sodium (PROLENSA) 0.07 % SOLN [id number] Yes Place 1 drop into both eyes 2 (two) times daily. [name], MD Taking Active   diltiazem [facility]) 300 MG 24 hr capsule [id number] Yes TAKE 1 CAPSULE BY MOUTH DAILY BEFORE BREAKFAST [name], MD Taking Active   dorzolamide-timolol (COSOPT) 22.3-6.8 MG/ML ophthalmic solution [id number] Yes INT 1 DROP IN OU BID [provider] Taking Active   hydrALAZINE (APRESOLINE) 50 MG tablet [id number] Yes Take 1 tablet (50 mg total) by mouth 3 (three) times daily. [name], MD Taking Active   losartan (COZAAR) 25 MG tablet [id number] Yes Take 0.5 tablets (12.5 mg total) by mouth daily. [name], MD Taking Active   Loteprednol Etabonate (LOTEMAX SM) 0.38 % GEL [id number] Yes Place 1 drop into both eyes 2 (two) times daily. [name], MD Taking Active   Nebivolol HCl 20 MG TABS [id number] Yes TAKE 1 TABLET(20 MG) BY MOUTH DAILY  [name], MD Taking Active   Netarsudil-Latanoprost (ROCKLATAN) 0.02-0.005 % SOLN [id number] Yes Apply to eye. [provider] Taking Active   SYMBICORT 160-4.5 MCG/ACT inhaler [id number] Yes INHALE 2 PUFFS INTO THE LUNGS TWICE DAILY [name], MD Taking Active         Discontinued 02/16/12 1058   zolpidem (AMBIEN) 5 MG tablet [id number] Yes TAKE 1 TABLET(5 MG) BY MOUTH AT BEDTIME AS NEEDED FOR SLEEP [name], MD Taking Active           Patient Active Problem List   Diagnosis Date Noted  . Chronic gout due to renal impairment of multiple sites without tophus 02/20/2021  . Anemia, chronic disease 10/10/2019  . Plantar fasciitis, left 10/10/2019  . Osteoarthritis of both feet 10/06/2018  . CRI (chronic renal insufficiency), stage 3 (moderate) 09/21/2018  . Vitamin D deficiency disease 09/21/2018  . OAB (overactive bladder) 10/15/2017  . Insomnia due to anxiety and fear 06/01/2017  . Gastroesophageal reflux disease with esophagitis 10/20/2016  . Asthma in adult, moderate persistent, uncomplicated 00/92/3300  . Allergic rhinitis due to pollen 04/19/2015  . Type II diabetes mellitus with manifestations ([place]) 08/14/2014  . Hyperlipidemia with target LDL less than 100 08/14/2014  . Hyperuricemia 02/29/2012  . Routine health maintenance 09/10/2011  . Essential hypertension 08/20/2007    Immunization History  Administered Date(s) Administered  . Fluad Quad(high Dose 65+) 08/01/2019  . Influenza Split 09/08/2011  . Influenza, High Dose Seasonal PF 10/15/2017, 09/21/2018  . Influenza, Seasonal, Injecte, Preservative Fre 12/08/2012  . Influenza,inj,Quad PF,6+ Mos 08/09/2013, 08/14/2014, 01/24/2016, 07/22/2016  . Influenza-Unspecified 11/11/2020  . Moderna Sars-Covid-2 Vaccination 01/13/2020, 02/10/2020, 12/02/2020  . Pneumococcal Conjugate-13 12/18/2014  . Pneumococcal Polysaccharide-23 08/09/2013, 10/10/2019  . Tdap 05/21/2017  . Tetanus 02/09/2014  . Zoster,  Live 02/09/2014    Conditions to be addressed/monitored:  Hypertension, Hyperlipidemia, Chronic Kidney Disease, Gout and Insomnia  Care Plan : [place]  Updates made by Charlton Haws, RPH since 04/26/2021 12:00 AM    Problem: Hypertension, Hyperlipidemia, Chronic Kidney Disease, Gout and Insomnia   Priority: High    Long-Range Goal: Disease management   Start Date: 04/26/2021  Expected End Date: 04/26/2022  This Visit's Progress: On track  Priority: High  Note:   Current Barriers:  . Unable to independently monitor therapeutic efficacy  Pharmacist Clinical Goal(s):  Marland Kitchen Patient will achieve adherence to monitoring guidelines and medication adherence to achieve therapeutic efficacy through collaboration with PharmD and provider.   Interventions: . 1:1 collaboration with [name], MD regarding development and update of comprehensive plan of care as evidenced by provider attestation and co-signature . Inter-disciplinary care team collaboration (see longitudinal plan of care) . Comprehensive medication review performed; medication list updated in electronic medical record  Hypertension / CKD stage 3b    BP goal < 140/90 Patient checks BP at home: couple times a week Patient home BP readings are ranging <140/90  Patient has failed these meds in the past: valsartan, chlorthalidone, HCTZ, metoprolol, losartan Patient is currently controlled on the following medications:   Diltiazem 387 mg qAM  Bystolic 20 mg daily  Hydralazine 50 mg TID  Losartan 25 mg daily   We discussed: BP goals; benefits of medications; pt denies side effects   Plan: Continue current medications    Hyperlipidemia    LDL goal < 100  Patient has failed these meds in past: n/a Patient is currently controlled on the following medications:   Atorvastatin 10 mg daily   We discussed:  diet and exercise extensively; cholesterol goals and benefits of statin for CV risk  reduction   Plan: Continue current medications and control with diet and exercise   Gout    Patient has failed these meds in past: allopurinol Patient is currently uncontrolled on the following medications:   Allopurinol 100 mg daily   We discussed:  pt reports thumb pain for last 2 weeks; denies injury to thumb; pain is in the joint; she denies redness/swelling in the area; she has been taking Tylenol to help with pain but reports it is getting worse, not better -discussed this could potentially be a gout flare, although suspicion is lower due to lack of redness, swelling and warmth   Plan: Continue current medications; advised to f/u with PCP if pain does not improve over the weekend   Insomnia/Anxiety    Patient has failed these meds in past: n/a Patient is currently controlled on the following medications:   Zolpidem 5 mg HS prn   We discussed:  Pt reports 5 mg dose does not help at all; she often takes 2 to be able to sleep; she reports most of her sleep difficulties come from back pain; she takes 2 Tylenol Extra Strength at bedtime and this is often enough to help her sleep; she is interested in getting new rx for zolpidem 10 mg; discussed this dose was previously reduced due to Beers criteria and dangers of >5 mg in women over 65. Pt is adamant that she has never had issues with 10 mg dose. Advised she discuss with PCP   Plan: Continue current medications  Patient Goals/Self-Care Activities . Patient will:  - take medications as prescribed focus on medication adherence by routine check blood pressure daily, document, and provide at future appointments       Medication Assistance: None required.  Patient affirms current coverage meets needs.  Compliance/Adherence/Medication fill history: Care Gaps: Foot exam  Hepatitis C screening  Star-Rating Drugs: Atorvastatin - LF 02/18/21 90 ds Losartan - LF 02/11/21 90 ds  Patient's preferred pharmacy is:  [facility]  #[id number] [place] [address] Phone: [phone] Fax: [phone]  [facility] [place], [address] [place] [address] Phone: [phone] Fax: [phone]  Uses pill box? No - prefers bottles Pt endorses 100% compliance  We discussed: Current pharmacy is preferred with insurance plan and patient is satisfied with pharmacy services Patient decided to: Continue current medication management strategy  Care Plan and Follow Up Patient Decision:  Patient agrees to Care Plan and Follow-up.  Plan: Telephone follow up appointment with care management team member scheduled for:  3 months  Charlene Brooke, PharmD, [place], CPP Clinical Pharmacist [facility] at [facility] [phone]

## 2021-04-26 NOTE — Patient Instructions (Signed)
Visit Information  Phone number for Pharmacist: [phone]  Goals Addressed            This Visit's Progress   . Manage My Medicine       Timeframe:  Long-Range Goal Priority:  Medium Start Date:    04/26/21                         Expected End Date:    04/26/22                   Follow Up Date Nov 2022   - call for medicine refill 2 or 3 days before it runs out - call if I am sick and can't take my medicine - keep a list of all the medicines I take; vitamins and herbals too    Why is this important?   . These steps will help you keep on track with your medicines.   Notes:       The patient verbalized understanding of instructions, educational materials, and care plan provided today and declined offer to receive copy of patient instructions, educational materials, and care plan.  Telephone follow up appointment with pharmacy team member scheduled for:  Charlene Brooke, PharmD, BCACP, CPP Clinical Pharmacist [facility] at [facility] [phone]

## 2021-05-02 NOTE — Progress Notes (Signed)
[facility] Note  05/06/2021     CHIEF COMPLAINT Patient presents for Retina Follow Up   HISTORY OF PRESENT ILLNESS: Sarah [name] is a 80 y.o. female who presents to the clinic today for:   HPI    Retina Follow Up    Patient presents with  Other.  In both eyes.  Duration of 4 weeks.  Since onset it is stable.  I, the attending physician,  performed the HPI with the patient and updated documentation appropriately.          Comments    4 week follow up CME OU- No changes since last visit.  Using Prolenza BID, Lotemax BID, and Rocklatan qhs OU.        Last edited by [name], MD on 05/07/2021  1:11 PM. (History)    pt has not been using brim or cosopt, vision is stable  Referring physician: Hortencia Pilar, MD [place],  [place] [address]  HISTORICAL INFORMATION:   Selected notes from the MEDICAL RECORD NUMBER Referred by Dr. Kathlen Mody for chronic uveitis/[place]   CURRENT MEDICATIONS: Current Outpatient Medications (Ophthalmic Drugs)  Medication Sig  . Bromfenac Sodium (PROLENSA) 0.07 % SOLN Place 1 drop into both eyes 2 (two) times daily.  . Loteprednol Etabonate (LOTEMAX SM) 0.38 % GEL Place 1 drop into both eyes 2 (two) times daily.  . Netarsudil-Latanoprost (ROCKLATAN) 0.02-0.005 % SOLN Apply to eye.  . brimonidine (ALPHAGAN) 0.2 % ophthalmic solution 1 drop 3 (three) times daily. (Patient not taking: Reported on 05/06/2021)  . dorzolamide-timolol (COSOPT) 22.3-6.8 MG/ML ophthalmic solution INT 1 DROP IN OU BID (Patient not taking: Reported on 05/06/2021)   No current facility-administered medications for this visit. (Ophthalmic Drugs)   Current Outpatient Medications (Other)  Medication Sig  . acetaminophen (TYLENOL) 500 MG tablet Take 500 mg by mouth every 6 (six) hours as needed.  Marland Kitchen allopurinol (ZYLOPRIM) 100 MG tablet Take 1 tablet (100 mg total) by mouth daily.  Marland Kitchen atorvastatin (LIPITOR) 10 MG tablet TAKE 1  TABLET(10 MG) BY MOUTH DAILY  . diltiazem (TIAZAC) 300 MG 24 hr capsule TAKE 1 CAPSULE BY MOUTH DAILY BEFORE BREAKFAST  . hydrALAZINE (APRESOLINE) 50 MG tablet Take 1 tablet (50 mg total) by mouth 3 (three) times daily.  Marland Kitchen losartan (COZAAR) 25 MG tablet Take 0.5 tablets (12.5 mg total) by mouth daily.  . Nebivolol HCl 20 MG TABS TAKE 1 TABLET(20 MG) BY MOUTH DAILY  . SYMBICORT 160-4.5 MCG/ACT inhaler INHALE 2 PUFFS INTO THE LUNGS TWICE DAILY  . zolpidem (AMBIEN) 5 MG tablet TAKE 1 TABLET(5 MG) BY MOUTH AT BEDTIME AS NEEDED FOR SLEEP   No current facility-administered medications for this visit. (Other)      REVIEW OF SYSTEMS: ROS    Positive for: Neurological, Musculoskeletal, Eyes, Respiratory   Negative for: Constitutional, Gastrointestinal, Skin, Genitourinary, HENT, Endocrine, Cardiovascular, Psychiatric, Allergic/Imm, Heme/Lymph   Last edited by Leonie Douglas, COA on 05/06/2021  1:54 PM. (History)       ALLERGIES Allergies  Allergen Reactions  . Lisinopril     REACTION: Lightheaded "sick"    PAST MEDICAL HISTORY Past Medical History:  Diagnosis Date  . Allergic rhinitis   . Arthritis   . Chronic back pain   . Contact dermatitis and other eczema, due to unspecified cause   . Diabetes mellitus without complication ([place])   . Dysrhythmia    hx of heart beating fast   .  Glaucoma    POAG OU  . History of sinusitis   . Hx of adenomatous colonic polyps 10/17/2014  . Hypertension   . Hypertensive retinopathy    OU  . Peripheral vascular disease (HCC)    hx of DVT - left leg   . Personal history of unspecified circulatory disease   . Plantar fascial fibromatosis   . Shortness of breath    with exertion    Past Surgical History:  Procedure Laterality Date  . ABDOMINAL HYSTERECTOMY    . CATARACT EXTRACTION Bilateral 2018  . EYE SURGERY Bilateral 2018   Cat Sx  . FLEXIBLE SIGMOIDOSCOPY    . FOOT SURGERY Bilateral   . History of Lumbar fusion     '87  . SHOULDER  ARTHROSCOPY WITH ROTATOR CUFF REPAIR AND SUBACROMIAL DECOMPRESSION Left 01/14/2013   Procedure: Left Shoulder Arthroscopy with Debridement, Subacromial Decompression,rotator cuff repair;  Surgeon: Mcarthur Rossetti, MD;  Location: [facility];  Service: Orthopedics;  Laterality: Left;  Left Shoulder Arthroscopy with Debridement, Subacromial Decompression    FAMILY HISTORY Family History  Problem Relation Age of Onset  . Leukemia Mother   . Brain cancer Father        mets  . Throat cancer Brother        x 2 brothers    SOCIAL HISTORY Social History   Tobacco Use  . Smoking status: Never Smoker  . Smokeless tobacco: Never Used  Vaping Use  . Vaping Use: Never used  Substance Use Topics  . Alcohol use: No  . Drug use: No         OPHTHALMIC EXAM:  Base Eye Exam    Visual Acuity (Snellen - Linear)      Right Left   Dist [place] 20/20 -1 20/50 -2   Dist ph [place]  20/30       Tonometry (Tonopen, 2:00 PM)      Right Left   Pressure 29 23       Tonometry #2 (Applanation, 2:06 PM)      Right Left   Pressure 40        Tonometry #3 (Tonopen, 3:09 PM)      Right Left   Pressure 28        Tonometry Comments   Instilled in office OD: 1 drop Brimonidine @2 :11 and 1 drop Dorz/Timolol @2 :15        Pupils      Dark Light Shape React APD   Right 3 2 Round Minimal None   Left 3 2 Round Minimal None       Visual Fields (Counting fingers)      Left Right    Full Full       Extraocular Movement      Right Left    Full Full       Neuro/Psych    Oriented x3: Yes   Mood/Affect: Normal       Dilation    Both eyes: 1.0% Mydriacyl, 2.5% Phenylephrine @ 2:06 PM        Slit Lamp and Fundus Exam    Slit Lamp Exam      Right Left   Lids/Lashes Dermatochalasis - upper lid Dermatochalasis - upper lid   Conjunctiva/Sclera Melanosis, 1+Injection Melanosis, 1+ Injection   Cornea arcus, well healed temporal cataract wounds, trace Punctate epithelial erosions, early band K  nasal and temporal arcus, well healed temporal cataract wounds, trace Punctate epithelial erosions, early band K nasal and temporal, mild tear film  debris   Anterior Chamber Deep, 1+fine pigment Deep, 1+fine pigment   Iris Round and dilated, mild atrophy Round and dilated, mild, diffuse atrophy   Lens PC IOL in good position PC IOL in good position   Vitreous Vitreous syneresis, +pigment, mild vitreous condensations Vitreous syneresis, fine pigment, vitreous condensations       Fundus Exam      Right Left   Disc Pink and Sharp, PPA/PPP Pink and Sharp, Compact, temporal PPA   C/D Ratio 0.5 0.5   Macula Flat, good foveal reflex, RPE mottling, drusen temporal macula, No heme or edema -- CME stably resolved Flat, Blunted foveal reflex, central cystic changes - stably improved, drusen, RPE mottling, no heme   Vessels attenuated, Tortuous, mild Copper wiring attenuated, mild tortuousity, mild Copper wiring   Periphery Attached, no heme    Attached, trace peripheral MA             IMAGING AND PROCEDURES  Imaging and Procedures for 05/06/2021  OCT, Retina - OU - Both Eyes       Right Eye Quality was good. Central Foveal Thickness: 279. Progression has been stable. Findings include no SRF, myopic contour, no IRF, normal foveal contour (No CME).   Left Eye Quality was good. Central Foveal Thickness: 274. Progression has been stable. Findings include abnormal foveal contour, no SRF, myopic contour, no IRF (No CME).   Notes *Images captured and stored on drive  Diagnosis / Impression:  No CME OU  Clinical management:  See below  Abbreviations: NFP - Normal foveal profile. CME - cystoid macular edema. PED - pigment epithelial detachment. IRF - intraretinal fluid. SRF - subretinal fluid. EZ - ellipsoid zone. ERM - epiretinal membrane. ORA - outer retinal atrophy. ORT - outer retinal tubulation. SRHM - subretinal hyper-reflective material. IRHM - intraretinal hyper-reflective material                  ASSESSMENT/PLAN:    ICD-10-CM   1. 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 Retinal edema  H35.81 OCT, Retina - OU - Both Eyes  4. Essential hypertension  I10   5. Hypertensive retinopathy of both eyes  H35.033   6. Pseudophakia, both eyes  Z96.1   7. Primary open angle glaucoma (POAG) of both eyes, mild stage  H40.1131     1-3. CME OU - [place] / posterior uveitis OU  - pt with history of chronic CME post cataract surgery OU in 2018  - history of improvement on topical steroid and NSAID, but recurrence w/ tapering; also history of steroid response  - FA 01.24.22 shows hyperfluorescence of disc and perifoveal hyperfluorescence OU consistent with [place] CME; also shows focal peripheral hyperfluorescence OU consistent with posterior uveitis / inflammatory component  - currently using Lotemax SM and Prolensa BID OU  - IOP 40,23 -- pt has not been taking Brim and cosopt  - BCVA 20/20 OD, OS: stable at 20/30   - OCT shows OD: stable improvement in cystic changes; OS: Stable resolution of IRF  - dec Lotemax SM and prolensa to [place] OU -- may need to be on chronic low dose Lotemax and prolensa if CME recurs  - restart Brim and Cosopt to TID OU  - f/u 4-6 weeks, DFE, OCT  4,5. Hypertensive retinopathy OU - discussed importance of tight BP control - monitor  6. Pseudophakia OU  - s/p CE/IOL OU (2018)  - IOLs in good  position  - chronic CME as above  - monitor  7. POAG OU  - under the expert manage of Dr. Kathlen Mody  - mild stage, s/p SLT OU x2, s/p goniotomy OU  - history of steroid response  - IOP 29,23 today  - on rocklatan qhs OU, stopped taking cosopt and brimonidine tid OU  - restart cosopt and brimonidine tid OU  - f/u 4-6  wks for IOP and CME check   Ophthalmic Meds Ordered this visit:  No orders of the defined types were placed in this encounter.     Return for f/u 4-6 weeks, CME OU, DFE,  OCT.  There are no Patient Instructions on file for this visit.  This document serves as a record of services personally performed by Gardiner Sleeper, MD, PhD. It was created on their behalf by Leeann Must, [place], an ophthalmic technician. The creation of this record is the provider's dictation and/or activities during the visit.    Electronically signed by: Leeann Must, COA @TODAY @ 1:16 PM   This document serves as a record of services personally performed by Gardiner Sleeper, MD, PhD. It was created on their behalf by San Jetty. [place] Shark, OA an ophthalmic technician. The creation of this record is the provider's dictation and/or activities during the visit.    Electronically signed by: San Jetty. [place], [place] 06.06.2022 1:16 PM  Gardiner Sleeper, M.D., Ph.D. Diseases & Surgery of the Retina and [place] 05/06/2021   I have reviewed the above documentation for accuracy and completeness, and I agree with the above. Gardiner Sleeper, M.D., Ph.D. 05/07/21 1:16 PM   Abbreviations: M myopia (nearsighted); A astigmatism; H hyperopia (farsighted); P presbyopia; Mrx spectacle prescription;  CTL contact lenses; OD right eye; OS left eye; OU both eyes  XT exotropia; ET esotropia; PEK punctate epithelial keratitis; PEE punctate epithelial erosions; DES dry eye syndrome; MGD meibomian gland dysfunction; ATs artificial tears; PFAT's preservative free artificial tears; [place] nuclear sclerotic cataract; PSC posterior subcapsular cataract; ERM epi-retinal membrane; PVD posterior vitreous detachment; RD retinal detachment; DM diabetes mellitus; DR diabetic retinopathy; NPDR non-proliferative diabetic retinopathy; PDR proliferative diabetic retinopathy; CSME clinically significant macular edema; DME diabetic macular edema; dbh dot blot hemorrhages; CWS cotton wool spot; POAG primary open angle glaucoma; C/D cup-to-disc ratio; HVF humphrey visual field; GVF goldmann visual field; OCT  optical coherence tomography; IOP intraocular pressure; BRVO Branch retinal vein occlusion; CRVO central retinal vein occlusion; CRAO central retinal artery occlusion; BRAO branch retinal artery occlusion; RT retinal tear; SB scleral buckle; PPV pars plana vitrectomy; VH Vitreous hemorrhage; PRP panretinal laser photocoagulation; IVK intravitreal kenalog; VMT vitreomacular traction; MH Macular hole;  NVD neovascularization of the disc; NVE neovascularization elsewhere; AREDS age related eye disease study; ARMD age related macular degeneration; POAG primary open angle glaucoma; EBMD epithelial/anterior basement membrane dystrophy; ACIOL anterior chamber intraocular lens; IOL intraocular lens; PCIOL posterior chamber intraocular lens; Phaco/IOL phacoemulsification with intraocular lens placement; [place] photorefractive keratectomy; LASIK laser assisted in situ keratomileusis; HTN hypertension; DM diabetes mellitus; COPD chronic obstructive pulmonary disease

## 2021-05-06 ENCOUNTER — Other Ambulatory Visit: Payer: Self-pay

## 2021-05-06 ENCOUNTER — Ambulatory Visit (INDEPENDENT_AMBULATORY_CARE_PROVIDER_SITE_OTHER): Payer: Medicare Other | Admitting: Ophthalmology

## 2021-05-06 DIAGNOSIS — H35353 Cystoid macular degeneration, bilateral: Secondary | ICD-10-CM

## 2021-05-06 DIAGNOSIS — H3093 Unspecified chorioretinal inflammation, bilateral: Secondary | ICD-10-CM

## 2021-05-06 DIAGNOSIS — H35033 Hypertensive retinopathy, bilateral: Secondary | ICD-10-CM

## 2021-05-06 DIAGNOSIS — H3581 Retinal edema: Secondary | ICD-10-CM | POA: Diagnosis not present

## 2021-05-06 DIAGNOSIS — I10 Essential (primary) hypertension: Secondary | ICD-10-CM | POA: Diagnosis not present

## 2021-05-06 DIAGNOSIS — Z961 Presence of intraocular lens: Secondary | ICD-10-CM

## 2021-05-06 DIAGNOSIS — H401131 Primary open-angle glaucoma, bilateral, mild stage: Secondary | ICD-10-CM

## 2021-05-07 ENCOUNTER — Encounter (INDEPENDENT_AMBULATORY_CARE_PROVIDER_SITE_OTHER): Payer: Self-pay | Admitting: Ophthalmology

## 2021-05-18 ENCOUNTER — Other Ambulatory Visit: Payer: Self-pay | Admitting: Internal Medicine

## 2021-05-18 DIAGNOSIS — E790 Hyperuricemia without signs of inflammatory arthritis and tophaceous disease: Secondary | ICD-10-CM

## 2021-05-18 DIAGNOSIS — M1A39X0 Chronic gout due to renal impairment, multiple sites, without tophus (tophi): Secondary | ICD-10-CM

## 2021-05-27 ENCOUNTER — Ambulatory Visit: Payer: Medicare Other | Admitting: Family Medicine

## 2021-05-31 ENCOUNTER — Other Ambulatory Visit: Payer: Self-pay

## 2021-05-31 ENCOUNTER — Ambulatory Visit (INDEPENDENT_AMBULATORY_CARE_PROVIDER_SITE_OTHER): Payer: Medicare Other | Admitting: Family Medicine

## 2021-05-31 DIAGNOSIS — M79645 Pain in left finger(s): Secondary | ICD-10-CM

## 2021-05-31 NOTE — Progress Notes (Signed)
Office Visit Note   Patient: Sarah [name]           Date of Birth: 1941-07-17           MRN: [id number] Visit Date: 05/31/2021 Requested by: [name], MD [address] [place],  [place] [address] PCP: [name], MD  Subjective: Chief Complaint  Patient presents with   Left Thumb - Pain    Pain in the thumb x 2 months. NKI. Tender to touch sometimes. Pops with flexion. Right-hand dominant.     HPI: She is here with left thumb pain.  Symptoms started about 2 months ago, no injury.  Pain near the MCP joint, tender to touch and it is now keeping her awake at night sometimes.  It pops when she bends it.  She is right-hand dominant.  No change in her activities to account for this.  Her daughter is with her today.              ROS:   All other systems were reviewed and are negative.  Objective: Vital Signs: There were no vitals taken for this visit.  Physical Exam:  General:  Alert and oriented, in no acute distress. Pulm:  Breathing unlabored. Psy:  Normal mood, congruent affect. Skin: No erythema Left hand: She has full range of motion of her thumb but it triggers in flexion.  She has a tender nodule at the A1 pulley and this reproduces her pain.    Imaging: No results found.  Assessment & Plan: Left trigger thumb -We discussed a variety of treatment options and elected to splint the IP joint in extension for the next week or 2 while she uses ice and Voltaren gel topically.  If symptoms persist we could either try hand therapy, or a cortisone injection.     Procedures: No procedures performed        PMFS History: Patient Active Problem List   Diagnosis Date Noted   Chronic gout due to renal impairment of multiple sites without tophus 02/20/2021   Anemia, chronic disease 10/10/2019   Plantar fasciitis, left 10/10/2019   Osteoarthritis of both feet 10/06/2018   CRI (chronic renal insufficiency), stage 3 (moderate) 09/21/2018   Vitamin D deficiency  disease 09/21/2018   OAB (overactive bladder) 10/15/2017   Insomnia due to anxiety and fear 06/01/2017   Gastroesophageal reflux disease with esophagitis 10/20/2016   Asthma in adult, moderate persistent, uncomplicated 48/18/5631   Allergic rhinitis due to pollen 04/19/2015   Type II diabetes mellitus with manifestations ([place]) 08/14/2014   Hyperlipidemia with target LDL less than 100 08/14/2014   Hyperuricemia 02/29/2012   Routine health maintenance 09/10/2011   Essential hypertension 08/20/2007   Past Medical History:  Diagnosis Date   Allergic rhinitis    Arthritis    Chronic back pain    Contact dermatitis and other eczema, due to unspecified cause    Diabetes mellitus without complication ([place])    Dysrhythmia    hx of heart beating fast    Glaucoma    POAG OU   History of sinusitis    Hx of adenomatous colonic polyps 10/17/2014   Hypertension    Hypertensive retinopathy    OU   Peripheral vascular disease (HCC)    hx of DVT - left leg    Personal history of unspecified circulatory disease    Plantar fascial fibromatosis    Shortness of breath    with exertion     Family History  Problem Relation Age of Onset   Leukemia Mother    Brain cancer Father        mets   Throat cancer Brother        x 2 brothers    Past Surgical History:  Procedure Laterality Date   ABDOMINAL HYSTERECTOMY     CATARACT EXTRACTION Bilateral 2018   EYE SURGERY Bilateral 2018   Cat Sx   FLEXIBLE SIGMOIDOSCOPY     FOOT SURGERY Bilateral    History of Lumbar fusion     '87   SHOULDER ARTHROSCOPY WITH ROTATOR CUFF REPAIR AND SUBACROMIAL DECOMPRESSION Left 01/14/2013   Procedure: Left Shoulder Arthroscopy with Debridement, Subacromial Decompression,rotator cuff repair;  Surgeon: Mcarthur Rossetti, MD;  Location: [facility];  Service: Orthopedics;  Laterality: Left;  Left Shoulder Arthroscopy with Debridement, Subacromial Decompression   Social History   Occupational History   Occupation:  retired  Tobacco Use   Smoking status: Never   Smokeless tobacco: Never  Vaping Use   Vaping Use: Never used  Substance and Sexual Activity   Alcohol use: No   Drug use: No   Sexual activity: Not Currently

## 2021-06-05 NOTE — Progress Notes (Signed)
[facility] Note  06/10/2021     CHIEF COMPLAINT Patient presents for Retina Follow Up   HISTORY OF PRESENT ILLNESS: Sarah [name] is a 80 y.o. female who presents to the clinic today for:   HPI     Retina Follow Up   Patient presents with  Other.  In both eyes.  Duration of 5 weeks.  Since onset it is stable.  I, the attending physician,  performed the HPI with the patient and updated documentation appropriately.        Comments   Pt here for ret follow up CME OU. Pt states she is doing well, vision is ok. Some difficulty focusing in OS that comes and goes. Reports using Prolensa 1gtts QD OU, Lotemax 1gtts QD OU, Rocklatan 1gtts QHS OU, Brimonidine 1gtts TID OU, Dorzolamide 1gtts TID OU.       Last edited by [name], MD on 06/13/2021 12:36 AM.     pt has not been using brim or cosopt, vision is stable  Referring physician: [name], [place],  [place] [address]  HISTORICAL INFORMATION:   Selected notes from the MEDICAL RECORD NUMBER Referred by Dr. Kathlen Mody for chronic uveitis/[facility]   CURRENT MEDICATIONS: Current Outpatient Medications (Ophthalmic Drugs)  Medication Sig   brimonidine (ALPHAGAN) 0.2 % ophthalmic solution 1 drop 3 (three) times daily. (Patient not taking: Reported on 05/06/2021)   Bromfenac Sodium (PROLENSA) 0.07 % SOLN Place 1 drop into both eyes 2 (two) times daily.   dorzolamide-timolol (COSOPT) 22.3-6.8 MG/ML ophthalmic solution INT 1 DROP IN OU BID (Patient not taking: Reported on 05/06/2021)   Loteprednol Etabonate (LOTEMAX SM) 0.38 % GEL Place 1 drop into both eyes 2 (two) times daily.   Netarsudil-Latanoprost (ROCKLATAN) 0.02-0.005 % SOLN Apply to eye.   No current facility-administered medications for this visit. (Ophthalmic Drugs)   Current Outpatient Medications (Other)  Medication Sig   acetaminophen (TYLENOL) 500 MG tablet Take 500 mg by mouth every 6 (six) hours as needed.    allopurinol (ZYLOPRIM) 100 MG tablet TAKE 1 TABLET(100 MG) BY MOUTH DAILY   atorvastatin (LIPITOR) 10 MG tablet TAKE 1 TABLET(10 MG) BY MOUTH DAILY   diltiazem (TIAZAC) 300 MG 24 hr capsule TAKE 1 CAPSULE BY MOUTH DAILY BEFORE BREAKFAST   hydrALAZINE (APRESOLINE) 50 MG tablet Take 1 tablet (50 mg total) by mouth 3 (three) times daily.   losartan (COZAAR) 25 MG tablet Take 0.5 tablets (12.5 mg total) by mouth daily.   Nebivolol HCl 20 MG TABS TAKE 1 TABLET(20 MG) BY MOUTH DAILY   SYMBICORT 160-4.5 MCG/ACT inhaler INHALE 2 PUFFS INTO THE LUNGS TWICE DAILY   zolpidem (AMBIEN) 5 MG tablet TAKE 1 TABLET(5 MG) BY MOUTH AT BEDTIME AS NEEDED FOR SLEEP   No current facility-administered medications for this visit. (Other)      REVIEW OF SYSTEMS: ROS   Positive for: Neurological, Musculoskeletal, Eyes, Respiratory Negative for: Constitutional, Gastrointestinal, Skin, Genitourinary, HENT, Endocrine, Cardiovascular, Psychiatric, Allergic/Imm, Heme/Lymph Last edited by Kingsley Spittle, COT on 06/10/2021  1:13 PM.        ALLERGIES Allergies  Allergen Reactions   Lisinopril     REACTION: Lightheaded "sick"    PAST MEDICAL HISTORY Past Medical History:  Diagnosis Date   Allergic rhinitis    Arthritis    Chronic back pain    Contact dermatitis and other eczema, due to unspecified cause    Diabetes mellitus without complication ([place])  Dysrhythmia    hx of heart beating fast    Glaucoma    POAG OU   History of sinusitis    Hx of adenomatous colonic polyps 10/17/2014   Hypertension    Hypertensive retinopathy    OU   Peripheral vascular disease (HCC)    hx of DVT - left leg    Personal history of unspecified circulatory disease    Plantar fascial fibromatosis    Shortness of breath    with exertion    Past Surgical History:  Procedure Laterality Date   ABDOMINAL HYSTERECTOMY     CATARACT EXTRACTION Bilateral 2018   EYE SURGERY Bilateral 2018   Cat Sx   FLEXIBLE  SIGMOIDOSCOPY     FOOT SURGERY Bilateral    History of Lumbar fusion     '87   SHOULDER ARTHROSCOPY WITH ROTATOR CUFF REPAIR AND SUBACROMIAL DECOMPRESSION Left 01/14/2013   Procedure: Left Shoulder Arthroscopy with Debridement, Subacromial Decompression,rotator cuff repair;  Surgeon: Mcarthur Rossetti, MD;  Location: [facility];  Service: Orthopedics;  Laterality: Left;  Left Shoulder Arthroscopy with Debridement, Subacromial Decompression    FAMILY HISTORY Family History  Problem Relation Age of Onset   Leukemia Mother    Brain cancer Father        mets   Throat cancer Brother        x 2 brothers    SOCIAL HISTORY Social History   Tobacco Use   Smoking status: Never   Smokeless tobacco: Never  Vaping Use   Vaping Use: Never used  Substance Use Topics   Alcohol use: No   Drug use: No         OPHTHALMIC EXAM:  Base Eye Exam     Visual Acuity (Snellen - Linear)       Right Left   Dist [place] 20/20 20/30   Dist ph [place]  20/20    Correction: Glasses         Tonometry (Tonopen, 1:23 PM)       Right Left   Pressure 18 19         Pupils       Dark Light Shape React APD   Right 3 2 Round Minimal None   Left 3 2 Round Minimal None         Visual Fields (Counting fingers)       Left Right    Full Full         Extraocular Movement       Right Left    Full, Ortho Full, Ortho         Neuro/Psych     Oriented x3: Yes   Mood/Affect: Normal         Dilation     Both eyes: 1.0% Mydriacyl, 2.5% Phenylephrine @ 1:23 PM           Slit Lamp and Fundus Exam     Slit Lamp Exam       Right Left   Lids/Lashes Dermatochalasis - upper lid Dermatochalasis - upper lid   Conjunctiva/Sclera Melanosis, 1+Injection Melanosis, 1+ Injection   Cornea arcus, well healed temporal cataract wounds, trace Punctate epithelial erosions, early band K nasal and temporal arcus, well healed temporal cataract wounds, trace Punctate epithelial erosions, early band K  nasal and temporal, mild tear film debris   Anterior Chamber Deep, 0.5+ cell/pigment Deep and quiet   Iris Round and dilated, mild atrophy Round and dilated, mild, diffuse atrophy   Lens PC IOL  in good position PC IOL in good position   Vitreous Vitreous syneresis, +pigment, mild vitreous condensations Vitreous syneresis, fine pigment, vitreous condensations         Fundus Exam       Right Left   Disc Pink and Sharp, PPA/PPP Pink and Sharp, Compact, temporal PPA   C/D Ratio 0.5 0.5   Macula Flat, good foveal reflex, RPE mottling, drusen temporal macula, No heme or edema -- CME stably resolved Flat, Blunted foveal reflex, central cystic changes - stably improved, drusen, RPE mottling, no heme   Vessels attenuated, Tortuous, mild Copper wiring attenuated, mild tortuousity, mild Copper wiring   Periphery Attached, no heme    Attached, trace peripheral MA               IMAGING AND PROCEDURES  Imaging and Procedures for 06/10/2021  OCT, Retina - OU - Both Eyes       Right Eye Quality was good. Central Foveal Thickness: 271. Progression has been stable. Findings include no SRF, myopic contour, no IRF, normal foveal contour (No CME).   Left Eye Quality was good. Central Foveal Thickness: 261. Progression has been stable. Findings include abnormal foveal contour, no SRF, myopic contour, no IRF (No CME).   Notes *Images captured and stored on drive  Diagnosis / Impression:  No CME OU  Clinical management:  See below  Abbreviations: NFP - Normal foveal profile. CME - cystoid macular edema. PED - pigment epithelial detachment. IRF - intraretinal fluid. SRF - subretinal fluid. EZ - ellipsoid zone. ERM - epiretinal membrane. ORA - outer retinal atrophy. ORT - outer retinal tubulation. SRHM - subretinal hyper-reflective material. IRHM - intraretinal hyper-reflective material            ASSESSMENT/PLAN:    ICD-10-CM   1. CME (cystoid macular edema), bilateral  H35.353     2.  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 Retinal edema  H35.81 OCT, Retina - OU - Both Eyes    4. Essential hypertension  I10     5. Hypertensive retinopathy of both eyes  H35.033     6. Pseudophakia, both eyes  Z96.1     7. Primary open angle glaucoma (POAG) of both eyes, mild stage  H40.1131       1-3. CME OU - [place] / posterior uveitis OU  - pt with history of chronic CME post cataract surgery OU in 2018  - history of improvement on topical steroid and NSAID, but recurrence w/ tapering; also history of steroid response  - FA 01.24.22 shows hyperfluorescence of disc and perifoveal hyperfluorescence OU consistent with [place] CME; also shows focal peripheral hyperfluorescence OU consistent with posterior uveitis / inflammatory component  - currently using Lotemax SM and Prolensa BID OU  - IOP improved to 18,19  - BCVA 20/20 OD, OS: stable at 20/30   - OCT shows OD: stable improvement in cystic changes; OS: Stable resolution of IRF  - dec Lotemax SM and prolensa to Q1 drop every other day   - cont Brim and Cosopt to TID OU  - f/u 4-6 weeks, DFE, OCT  4,5. Hypertensive retinopathy OU - discussed importance of tight BP control - monitor  6. Pseudophakia OU  - s/p CE/IOL OU (2018)  - IOLs in good position  - chronic CME as above  - monitor  7. POAG OU  - under the expert manage of Dr. Kathlen Mody  - mild stage, s/p SLT OU x2, s/p goniotomy OU  -  history of steroid response  - IOP 18,19 today  - on rocklatan qhs OU  - cont cosopt and brimonidine tid OU  - f/u 4-6  wks for IOP and CME check   Ophthalmic Meds Ordered this visit:  No orders of the defined types were placed in this encounter.     Return for f/u 4-6 weeks, CME OU, DFE, OCT.  There are no Patient Instructions on file for this visit. This document serves as a record of services personally performed by Gardiner Sleeper, MD, PhD. It was created on their behalf by Leeann Must, [place], an ophthalmic  technician. The creation of this record is the provider's dictation and/or activities during the visit.    Electronically signed by: Leeann Must, COA @TODAY @ 12:40 AM  This document serves as a record of services personally performed by Gardiner Sleeper, MD, PhD. It was created on their behalf by San Jetty. Owens Shark, OA an ophthalmic technician. The creation of this record is the provider's dictation and/or activities during the visit.    Electronically signed by: San Jetty. Alsie, Younes 07.11.2022 12:40 AM  Gardiner Sleeper, M.D., Ph.D. Diseases & Surgery of the Retina and [place] 06/10/2021   I have reviewed the above documentation for accuracy and completeness, and I agree with the above. Gardiner Sleeper, M.D., Ph.D. 06/13/21 12:40 AM   Abbreviations: M myopia (nearsighted); A astigmatism; H hyperopia (farsighted); P presbyopia; Mrx spectacle prescription;  CTL contact lenses; OD right eye; OS left eye; OU both eyes  XT exotropia; ET esotropia; PEK punctate epithelial keratitis; PEE punctate epithelial erosions; DES dry eye syndrome; MGD meibomian gland dysfunction; ATs artificial tears; PFAT's preservative free artificial tears; [place] nuclear sclerotic cataract; PSC posterior subcapsular cataract; ERM epi-retinal membrane; PVD posterior vitreous detachment; RD retinal detachment; DM diabetes mellitus; DR diabetic retinopathy; NPDR non-proliferative diabetic retinopathy; PDR proliferative diabetic retinopathy; CSME clinically significant macular edema; DME diabetic macular edema; dbh dot blot hemorrhages; CWS cotton wool spot; POAG primary open angle glaucoma; C/D cup-to-disc ratio; HVF humphrey visual field; GVF goldmann visual field; OCT optical coherence tomography; IOP intraocular pressure; BRVO Branch retinal vein occlusion; CRVO central retinal vein occlusion; CRAO central retinal artery occlusion; BRAO branch retinal artery occlusion; RT retinal tear; SB scleral  buckle; PPV pars plana vitrectomy; VH Vitreous hemorrhage; PRP panretinal laser photocoagulation; IVK intravitreal kenalog; VMT vitreomacular traction; MH Macular hole;  NVD neovascularization of the disc; NVE neovascularization elsewhere; AREDS age related eye disease study; ARMD age related macular degeneration; POAG primary open angle glaucoma; EBMD epithelial/anterior basement membrane dystrophy; ACIOL anterior chamber intraocular lens; IOL intraocular lens; PCIOL posterior chamber intraocular lens; Phaco/IOL phacoemulsification with intraocular lens placement; [place] photorefractive keratectomy; LASIK laser assisted in situ keratomileusis; HTN hypertension; DM diabetes mellitus; COPD chronic obstructive pulmonary disease

## 2021-06-10 ENCOUNTER — Ambulatory Visit (INDEPENDENT_AMBULATORY_CARE_PROVIDER_SITE_OTHER): Payer: Medicare Other | Admitting: Ophthalmology

## 2021-06-10 ENCOUNTER — Other Ambulatory Visit: Payer: Self-pay

## 2021-06-10 ENCOUNTER — Encounter (INDEPENDENT_AMBULATORY_CARE_PROVIDER_SITE_OTHER): Payer: Self-pay | Admitting: Ophthalmology

## 2021-06-10 DIAGNOSIS — H3581 Retinal edema: Secondary | ICD-10-CM

## 2021-06-10 DIAGNOSIS — H401131 Primary open-angle glaucoma, bilateral, mild stage: Secondary | ICD-10-CM

## 2021-06-10 DIAGNOSIS — H35353 Cystoid macular degeneration, bilateral: Secondary | ICD-10-CM

## 2021-06-10 DIAGNOSIS — I10 Essential (primary) hypertension: Secondary | ICD-10-CM | POA: Diagnosis not present

## 2021-06-10 DIAGNOSIS — Z961 Presence of intraocular lens: Secondary | ICD-10-CM

## 2021-06-10 DIAGNOSIS — H3093 Unspecified chorioretinal inflammation, bilateral: Secondary | ICD-10-CM | POA: Diagnosis not present

## 2021-06-10 DIAGNOSIS — H35033 Hypertensive retinopathy, bilateral: Secondary | ICD-10-CM

## 2021-06-13 ENCOUNTER — Encounter (INDEPENDENT_AMBULATORY_CARE_PROVIDER_SITE_OTHER): Payer: Self-pay | Admitting: Ophthalmology

## 2021-06-24 ENCOUNTER — Other Ambulatory Visit: Payer: Self-pay

## 2021-06-24 ENCOUNTER — Encounter: Payer: Self-pay | Admitting: Family Medicine

## 2021-06-24 ENCOUNTER — Ambulatory Visit (INDEPENDENT_AMBULATORY_CARE_PROVIDER_SITE_OTHER): Payer: Medicare Other | Admitting: Family Medicine

## 2021-06-24 DIAGNOSIS — M79645 Pain in left finger(s): Secondary | ICD-10-CM

## 2021-06-24 DIAGNOSIS — M79644 Pain in right finger(s): Secondary | ICD-10-CM | POA: Diagnosis not present

## 2021-06-24 NOTE — Progress Notes (Signed)
Office Visit Note   Patient: Sarah [name]           Date of Birth: February 17, 1941           MRN: [id number] Visit Date: 06/24/2021 Requested by: [name], MD [address] [place],  [place] [address] PCP: [name], MD  Subjective: Chief Complaint  Patient presents with   Left Thumb - Pain, Follow-up    Thumb is still hurting. Tried the splint. She is considering a cortisone injection today.     HPI: She is here with persistent left thumb pain.  Splint did not help.  Voltaren gel has not helped.  It continues to hurt when she tries to flex it.              ROS:   All other systems were reviewed and are negative.  Objective: Vital Signs: There were no vitals taken for this visit.  Physical Exam:  General:  Alert and oriented, in no acute distress. Pulm:  Breathing unlabored. Psy:  Normal mood, congruent affect. Skin: No erythema Left thumb: She has a tender nodule at the A1 pulley.  It is not actively triggering, but her flexion motion is slightly limited today.   Imaging: Limited diagnostic ultrasound reveals tendon thickening at the level of the A1 pulley.  Assessment & Plan: Left trigger thumb -Discussed options with her and elected to inject with cortisone today.  She will follow-up as needed.     Procedures: Left thumb injection: After sterile prep with Betadine, injected 1 cc 1% lidocaine without epinephrine and 40 mg Depo-Medrol into the region of the A1 pulley.       PMFS History: Patient Active Problem List   Diagnosis Date Noted   Chronic gout due to renal impairment of multiple sites without tophus 02/20/2021   Anemia, chronic disease 10/10/2019   Plantar fasciitis, left 10/10/2019   Osteoarthritis of both feet 10/06/2018   CRI (chronic renal insufficiency), stage 3 (moderate) 09/21/2018   Vitamin D deficiency disease 09/21/2018   OAB (overactive bladder) 10/15/2017   Insomnia due to anxiety and fear 06/01/2017   Gastroesophageal  reflux disease with esophagitis 10/20/2016   Asthma in adult, moderate persistent, uncomplicated 0000000   Allergic rhinitis due to pollen 04/19/2015   Type II diabetes mellitus with manifestations ([place]) 08/14/2014   Hyperlipidemia with target LDL less than 100 08/14/2014   Hyperuricemia 02/29/2012   Routine health maintenance 09/10/2011   Essential hypertension 08/20/2007   Past Medical History:  Diagnosis Date   Allergic rhinitis    Arthritis    Chronic back pain    Contact dermatitis and other eczema, due to unspecified cause    Diabetes mellitus without complication ([place])    Dysrhythmia    hx of heart beating fast    Glaucoma    POAG OU   History of sinusitis    Hx of adenomatous colonic polyps 10/17/2014   Hypertension    Hypertensive retinopathy    OU   Peripheral vascular disease (HCC)    hx of DVT - left leg    Personal history of unspecified circulatory disease    Plantar fascial fibromatosis    Shortness of breath    with exertion     Family History  Problem Relation Age of Onset   Leukemia Mother    Brain cancer Father        mets   Throat cancer Brother        x 2  brothers    Past Surgical History:  Procedure Laterality Date   ABDOMINAL HYSTERECTOMY     CATARACT EXTRACTION Bilateral 2018   EYE SURGERY Bilateral 2018   Cat Sx   FLEXIBLE SIGMOIDOSCOPY     FOOT SURGERY Bilateral    History of Lumbar fusion     '87   SHOULDER ARTHROSCOPY WITH ROTATOR CUFF REPAIR AND SUBACROMIAL DECOMPRESSION Left 01/14/2013   Procedure: Left Shoulder Arthroscopy with Debridement, Subacromial Decompression,rotator cuff repair;  Surgeon: Mcarthur Rossetti, MD;  Location: [facility];  Service: Orthopedics;  Laterality: Left;  Left Shoulder Arthroscopy with Debridement, Subacromial Decompression   Social History   Occupational History   Occupation: retired  Tobacco Use   Smoking status: Never   Smokeless tobacco: Never  Vaping Use   Vaping Use: Never used   Substance and Sexual Activity   Alcohol use: No   Drug use: No   Sexual activity: Not Currently

## 2021-07-11 ENCOUNTER — Other Ambulatory Visit: Payer: Self-pay

## 2021-07-11 ENCOUNTER — Ambulatory Visit (INDEPENDENT_AMBULATORY_CARE_PROVIDER_SITE_OTHER): Payer: Medicare Other | Admitting: Pharmacist

## 2021-07-11 DIAGNOSIS — F409 Phobic anxiety disorder, unspecified: Secondary | ICD-10-CM

## 2021-07-11 DIAGNOSIS — M1A39X0 Chronic gout due to renal impairment, multiple sites, without tophus (tophi): Secondary | ICD-10-CM

## 2021-07-11 DIAGNOSIS — I10 Essential (primary) hypertension: Secondary | ICD-10-CM

## 2021-07-11 DIAGNOSIS — E785 Hyperlipidemia, unspecified: Secondary | ICD-10-CM

## 2021-07-11 NOTE — Progress Notes (Signed)
Chronic Care Management Pharmacy Note  07/12/2021 Name:  Sarah [name] MRN:  [id number] DOB:  [birth year]  Summary: -Pt reports improvement in trigger thumb after steroid injection -Pt reports compliance with medications as prescribed  Recommendations/Changes made from today's visit: -Recommend to continue current medication -Advised to get Covid booster at [facility] pt to make 6 month appt with PCP for September   Subjective: Sarah [name] is an 80 y.o. year old female who is a primary patient of [name], MD.  The CCM team was consulted for assistance with disease management and care coordination needs.    Engaged with patient by telephone for follow up visit in response to provider referral for pharmacy case management and/or care coordination services.   Consent to Services:  The patient was given information about Chronic Care Management services, agreed to services, and gave verbal consent prior to initiation of services.  Please see initial visit note for detailed documentation.   Patient Care Team: [name], MD as PCP - General (Internal Medicine) Regal, Tamala Fothergill, DPM as Consulting Physician (Podiatry) Claudia Desanctis, MD as Consulting Physician (Internal Medicine) Charlton Haws, [facility] as Pharmacist (Pharmacist)  Recent office visits: 02/18/21 Dr Ronnald Ramp OV: CPX. Uric acid > 8, started allopurinol 100 mg. Other labs stable.   Recent consult visits: 06/24/21 Dr Junius Roads (ortho): L trigger thumb - splint didn't help. Cortisone injection given. 05/31/21 Dr Hilts (ortho): L trigger thumb - splint w/ ice, voltaren gel.  04/08/21 Dr Coralyn Pear (ophthalmology): f/u CME  02/11/21 Dr (nephrology): f/u CKD. Increase losartan to 25 mg. Cont hydralazine 50 TID.  Hospital visits: None in previous 6 months  Objective:  Lab Results  Component Value Date   CREATININE 1.59 (H) 02/18/2021   BUN 37 (H) 02/18/2021   GFR 30.83 (L) 02/18/2021   GFRNONAA 54 (L)  01/07/2013   GFRAA 63 (L) 01/07/2013   NA 141 02/18/2021   K 4.5 02/18/2021   CALCIUM 10.0 02/18/2021   CO2 20 02/18/2021   GLUCOSE 84 02/18/2021    Lab Results  Component Value Date/Time   HGBA1C 5.7 02/18/2021 02:23 PM   HGBA1C 5.8 04/24/2020 02:20 PM   GFR 30.83 (L) 02/18/2021 02:23 PM   GFR 36.90 (L) 04/24/2020 02:20 PM   MICROALBUR 0.8 09/21/2018 03:14 PM   MICROALBUR 2.0 (H) 05/21/2017 08:52 AM    Last diabetic Eye exam:  Lab Results  Component Value Date/Time   HMDIABEYEEXA No Retinopathy 01/20/2019 12:00 AM    Last diabetic Foot exam: No results found for: HMDIABFOOTEX   Lab Results  Component Value Date   CHOL 144 02/18/2021   HDL 73.50 02/18/2021   LDLCALC 60 02/18/2021   TRIG 53.0 02/18/2021   CHOLHDL 2 02/18/2021    Hepatic Function Latest Ref Rng & Units 02/18/2021 10/10/2019 09/21/2018  Total Protein 6.0 - 8.3 g/dL 7.8 7.6 7.9  Albumin 3.5 - 5.2 g/dL 4.6 4.5 4.6  AST 0 - 37 U/L _0 ALT 0 - 35 U/L _1 Alk Phosphatase 39 - 117 U/L 81 78 84  Total Bilirubin 0.2 - 1.2 mg/dL 0.4 0.4 0.3  Bilirubin, Direct 0.0 - 0.3 mg/dL 0.0 0.0 -    Lab Results  Component Value Date/Time   TSH 1.68 02/18/2021 02:23 PM   TSH 2.55 10/10/2019 02:43 PM    CBC Latest Ref Rng & Units 02/18/2021 04/24/2020 10/10/2019  WBC 4.0 - 10.5 K/uL 5.6 5.6 5.9  Hemoglobin  12.0 - 15.0 g/dL 11.2(L) 11.3(L) 11.6(L)  Hematocrit 36.0 - 46.0 % 33.9(L) 33.8(L) 35.5(L)  Platelets 150.0 - 400.0 K/uL 206.0 238.0 253.0    Lab Results  Component Value Date/Time   VD25OH 50.53 02/18/2021 02:23 PM   VD25OH 89.19 10/10/2019 02:43 PM    Clinical ASCVD: No  The 10-year ASCVD risk score Mikey Bussing DC Jr., et al., 2013) is: 40.4%   Values used to calculate the score:     Age: 64 years     Sex: Female     Is Non-Hispanic African American: Yes     Diabetic: Yes     Tobacco smoker: No     Systolic Blood Pressure: 242 mmHg     Is BP treated: Yes     HDL Cholesterol: 73.5 mg/dL     Total  Cholesterol: 144 mg/dL    Depression screen [facility] 2/9 01/21/2021 10/24/2019 09/22/2018  Decreased Interest 0 0 0  Down, Depressed, Hopeless 0 1 0  PHQ - 2 Score 0 1 0  Altered sleeping - - -  Tired, decreased energy - - -  Change in appetite - - -  Feeling bad or failure about yourself  - - -  Trouble concentrating - - -  Moving slowly or fidgety/restless - - -  Suicidal thoughts - - -  PHQ-9 Score - - -  Difficult doing work/chores - - -     Social History   Tobacco Use  Smoking Status Never  Smokeless Tobacco Never   BP Readings from Last 3 Encounters:  02/18/21 (!) 150/70  01/21/21 130/60  06/19/20 138/86   Pulse Readings from Last 3 Encounters:  02/18/21 74  01/21/21 67  06/19/20 71   Wt Readings from Last 3 Encounters:  02/18/21 144 lb (65.3 kg)  01/21/21 145 lb 12.8 oz (66.1 kg)  06/19/20 138 lb (62.6 kg)   BMI Readings from Last 3 Encounters:  02/18/21 26.34 kg/m  01/21/21 26.67 kg/m  06/19/20 25.24 kg/m    Assessment/Interventions: Review of patient past medical history, allergies, medications, health status, including review of consultants reports, laboratory and other test data, was performed as part of comprehensive evaluation and provision of chronic care management services.   SDOH:  (Social Determinants of Health) assessments and interventions performed: Yes  SDOH Screenings   Alcohol Screen: Low Risk    Last Alcohol Screening Score (AUDIT): 0  Depression (PHQ2-9): Low Risk    PHQ-2 Score: 0  Financial Resource Strain: Not on file  Food Insecurity: No Food Insecurity   Worried About Charity fundraiser in the Last Year: Never true   Ran Out of Food in the Last Year: Never true  Housing: Low Risk    Last Housing Risk Score: 0  Physical Activity: Sufficiently Active   Days of Exercise per Week: 5 days   Minutes of Exercise per Session: 30 min  Social Connections: Engineer, building services of Communication with Friends and Family: More  than three times a week   Frequency of Social Gatherings with Friends and Family: Once a week   Attends Religious Services: More than 4 times per year   Active Member of [employer] or Organizations: No   Attends Music therapist: More than 4 times per year   Marital Status: Married  Stress: No Stress Concern Present   Feeling of Stress : Not at all  Tobacco Use: Low Risk    Smoking Tobacco Use: Never   Smokeless Tobacco Use: Never  Transportation Needs: No Data processing manager (Medical): No   Lack of Transportation (Non-Medical): No    CCM Care Plan  Allergies  Allergen Reactions   Lisinopril     REACTION: Lightheaded "sick"    Medications Reviewed Today     Reviewed by Charlton Haws, [facility] (Pharmacist) on 07/11/21 at 1653  Med List Status: <None>   Medication Order Taking? Sig Documenting Provider Last Dose Status Informant  acetaminophen (TYLENOL) 500 MG tablet [id number] Yes Take 500 mg by mouth every 6 (six) hours as needed. [provider] Taking Active   allopurinol (ZYLOPRIM) 100 MG tablet [id number] Yes TAKE 1 TABLET(100 MG) BY MOUTH DAILY [name], MD Taking Active   atorvastatin (LIPITOR) 10 MG tablet [id number] Yes TAKE 1 TABLET(10 MG) BY MOUTH DAILY [name], MD Taking Active   brimonidine (ALPHAGAN) 0.2 % ophthalmic solution [id number] Yes 1 drop 3 (three) times daily. [provider] Taking Active   Bromfenac Sodium (PROLENSA) 0.07 % SOLN [id number] Yes Place 1 drop into both eyes 2 (two) times daily. [name], MD Taking Active   diltiazem [facility]) 300 MG 24 hr capsule [id number] Yes TAKE 1 CAPSULE BY MOUTH DAILY BEFORE BREAKFAST [name], MD Taking Active   dorzolamide-timolol (COSOPT) 22.3-6.8 MG/ML ophthalmic solution [id number] Yes  [provider] Taking Active   hydrALAZINE (APRESOLINE) 50 MG tablet [id number] Yes Take 1 tablet (50 mg total) by mouth 3 (three) times daily. [name], MD Taking Active   losartan (COZAAR) 25 MG tablet [id number] Yes Take 0.5 tablets (12.5 mg total) by mouth daily. [name], MD Taking Active   Loteprednol Etabonate (LOTEMAX SM) 0.38 % GEL [id number] Yes Place 1 drop into both eyes 2 (two) times daily. [name], MD Taking Active   Nebivolol HCl 20 MG TABS [id number] Yes TAKE 1 TABLET(20 MG) BY MOUTH DAILY [name], MD Taking Active   Netarsudil-Latanoprost (ROCKLATAN) 0.02-0.005 % SOLN [id number] Yes Apply to eye. [provider] Taking Active            Med Note Linus Mako May 06, 2021  1:56 PM) Instill 1 drop qhs OU  SYMBICORT 160-4.5 MCG/ACT inhaler [id number] Yes INHALE 2 PUFFS INTO THE LUNGS TWICE DAILY [name], MD Taking Active     Discontinued 02/16/12 1058   zolpidem (AMBIEN) 5 MG tablet [id number] Yes TAKE 1 TABLET(5 MG) BY MOUTH AT BEDTIME AS NEEDED FOR SLEEP [name], MD Taking Active             Patient Active Problem List   Diagnosis Date Noted   Chronic gout due to renal impairment of multiple sites without tophus 02/20/2021   Anemia, chronic disease 10/10/2019   Plantar fasciitis, left 10/10/2019   Osteoarthritis of both feet 10/06/2018   CRI (chronic renal insufficiency), stage 3 (moderate) 09/21/2018   Vitamin D deficiency disease 09/21/2018   OAB (overactive bladder) 10/15/2017   Insomnia due to anxiety and fear 06/01/2017   Gastroesophageal reflux disease with esophagitis 10/20/2016   Asthma in adult, moderate persistent, uncomplicated 55/37/4827   Allergic rhinitis due to pollen 04/19/2015   Type II diabetes mellitus with manifestations ([place]) 08/14/2014   Hyperlipidemia with target LDL less than 100 08/14/2014   Hyperuricemia 02/29/2012   Routine health maintenance 09/10/2011   Essential hypertension 08/20/2007    Immunization History  Administered Date(s) Administered   Fluad Quad(high Dose 65+) 08/01/2019   Influenza Split  09/08/2011   Influenza,  High Dose Seasonal PF 10/15/2017, 09/21/2018   Influenza, Seasonal, Injecte, Preservative Fre 12/08/2012   Influenza,inj,Quad PF,6+ Mos 08/09/2013, 08/14/2014, 01/24/2016, 07/22/2016   Influenza-Unspecified 11/11/2020   Moderna Sars-Covid-2 Vaccination 01/13/2020, 02/10/2020, 12/02/2020   Pneumococcal Conjugate-13 12/18/2014   Pneumococcal Polysaccharide-23 08/09/2013, 10/10/2019   Tdap 05/21/2017   Tetanus 02/09/2014   Zoster, Live 02/09/2014    Conditions to be addressed/monitored:  Hypertension, Hyperlipidemia, Chronic Kidney Disease, Gout and Insomnia  Care Plan : [place]  Updates made by Charlton Haws, [place] since 07/12/2021 12:00 AM     Problem: Hypertension, Hyperlipidemia, Chronic Kidney Disease, Gout and Insomnia   Priority: High     Long-Range Goal: Disease management   Start Date: 04/26/2021  Expected End Date: 04/26/2022  This Visit's Progress: On track  Recent Progress: On track  Priority: High  Note:   Current Barriers:  Unable to independently monitor therapeutic efficacy  Pharmacist Clinical Goal(s):  Patient will achieve adherence to monitoring guidelines and medication adherence to achieve therapeutic efficacy through collaboration with PharmD and provider.   Interventions: 1:1 collaboration with [name], MD regarding development and update of comprehensive plan of care as evidenced by provider attestation and co-signature Inter-disciplinary care team collaboration (see longitudinal plan of care) Comprehensive medication review performed; medication list updated in electronic medical record  Hypertension / CKD stage 3b    BP goal < 140/90 Patient checks BP at home: no now, batteries are dead in monitor Patient home BP readings are ranging: n/a  Patient has failed these meds in the past: valsartan, chlorthalidone, HCTZ, metoprolol, losartan Patient is currently controlled on the following medications:  Diltiazem 300 mg  qAM Nebivolol 20 mg daily Hydralazine 50 mg TID Losartan 25 mg daily   We discussed: pt endorses compliance with medications as prescribed; she is not checking BP due to dead battery in her monitor; she plans to replace the battery soon and start checking periodically   Plan: Continue current medications    Hyperlipidemia    LDL goal < 100  Patient has failed these meds in past: n/a Patient is currently controlled on the following medications:  Atorvastatin 10 mg daily   We discussed:  LDL is at goal; pt endorses compliance with statin   Plan: Continue current medications and control with diet and exercise   Gout    Patient has failed these meds in past: allopurinol Patient is currently uncontrolled on the following medications:  Allopurinol 100 mg daily   We discussed:  pt endorses compliance; she denies recent gout flares; recent thumb pain was determined to be related to trigger finger and resolved with steroid injection   Plan: Continue current medications   Insomnia/Anxiety    Patient has failed these meds in past: n/a Patient is currently controlled on the following medications:  Zolpidem 5 mg HS prn   We discussed:  Pt reports 5 mg dose does not help at all; she often takes 2 to be able to sleep; she reports most of her sleep difficulties come from back pain; she takes 2 Tylenol Extra Strength at bedtime and this is often enough to help her sleep; she is interested in getting new rx for zolpidem 10 mg; discussed this dose was previously reduced due to Beers criteria and dangers of >5 mg in women over 65. Pt is adamant that she has never had issues with 10 mg dose. Advised she discuss with PCP   Plan: Continue current medications  Patient Goals/Self-Care Activities Patient will:  - take medications as prescribed -focus on medication adherence by routine -check blood pressure daily, document, and provide at future appointments         Medication Assistance:  None required.  Patient affirms current coverage meets needs.  Compliance/Adherence/Medication fill history: Care Gaps: Foot exam Hepatitis C screening Shingrix Covid booster (due 04/01/21)  Star-Rating Drugs: Atorvastatin - LF 06/19/21 90 ds Losartan - LF 06/24/21 90 ds  Patient's preferred pharmacy is:  [id number] - [address], [place] [place] [address] Phone: [phone] Fax: [phone]  Uses pill box? No - prefers bottles Pt endorses 100% compliance  We discussed: Current pharmacy is preferred with insurance plan and patient is satisfied with pharmacy services Patient decided to: Continue current medication management strategy  Care Plan and Follow Up Patient Decision:  Patient agrees to Care Plan and Follow-up.  Plan: Telephone follow up appointment with care management team member scheduled for:  6 months  Charlene Brooke, PharmD, [place], CPP Clinical Pharmacist [facility] [phone]

## 2021-07-11 NOTE — Progress Notes (Signed)
[facility] Note  07/15/2021     CHIEF COMPLAINT Patient presents for Retina Follow Up   HISTORY OF PRESENT ILLNESS: Sarah [name] is a 80 y.o. female who presents to the clinic today for:  HPI     Retina Follow Up   Patient presents with  Other.  In both eyes.  This started 5 weeks ago.  I, the attending physician,  performed the HPI with the patient and updated documentation appropriately.        Comments   Patient here for 5 weeks retina follow up for CME OU. Patient states vision doing ok. No eye pain. Had cataract surgery. Drops are Brimonidine TID OU, Dorzolamide/timolol TID OU, Lotemax every other day QD OU, Prolensa every other day QD OU and Rocklatan QHS OU.      Last edited by [name], MD on 07/15/2021  1:53 PM.        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   Bromfenac Sodium (PROLENSA) 0.07 % SOLN Place 1 drop into both eyes 2 (two) times daily.   dorzolamide-timolol (COSOPT) 22.3-6.8 MG/ML ophthalmic solution    Loteprednol Etabonate (LOTEMAX SM) 0.38 % GEL Place 1 drop into both eyes 2 (two) times daily.   Netarsudil-Latanoprost (ROCKLATAN) 0.02-0.005 % SOLN Apply to eye.   No current facility-administered medications for this visit. (Ophthalmic Drugs)   Current Outpatient Medications (Other)  Medication Sig   acetaminophen (TYLENOL) 500 MG tablet Take 500 mg by mouth every 6 (six) hours as needed.   allopurinol (ZYLOPRIM) 100 MG tablet TAKE 1 TABLET(100 MG) BY MOUTH DAILY   atorvastatin (LIPITOR) 10 MG tablet TAKE 1 TABLET(10 MG) BY MOUTH DAILY   diltiazem (TIAZAC) 300  MG 24 hr capsule TAKE 1 CAPSULE BY MOUTH DAILY BEFORE BREAKFAST   hydrALAZINE (APRESOLINE) 50 MG tablet Take 1 tablet (50 mg total) by mouth 3 (three) times daily.   losartan (COZAAR) 25 MG tablet Take 0.5 tablets (12.5 mg total) by mouth daily.   Nebivolol HCl 20 MG TABS TAKE 1 TABLET(20 MG) BY MOUTH DAILY   SYMBICORT 160-4.5 MCG/ACT inhaler INHALE 2 PUFFS INTO THE LUNGS TWICE DAILY   zolpidem (AMBIEN) 5 MG tablet TAKE 1 TABLET(5 MG) BY MOUTH AT BEDTIME AS NEEDED FOR SLEEP   No current facility-administered medications for this visit. (Other)   REVIEW OF SYSTEMS: ROS   Positive for: Neurological, Musculoskeletal, Eyes, Respiratory Negative for: Constitutional, Gastrointestinal, Skin, Genitourinary, HENT, Endocrine, Cardiovascular, Psychiatric, Allergic/Imm, Heme/Lymph Last edited by Theodore Demark, COA on 07/15/2021  1:47 PM.      ALLERGIES Allergies  Allergen Reactions   Lisinopril     REACTION: Lightheaded "sick"    PAST MEDICAL HISTORY Past Medical History:  Diagnosis Date   Allergic rhinitis    Arthritis    Chronic back pain    Contact dermatitis and other eczema, due to unspecified cause    Diabetes mellitus without complication ([place])    Dysrhythmia    hx of heart beating fast    Glaucoma    POAG OU   History of sinusitis    Hx of adenomatous colonic polyps 10/17/2014   Hypertension  Hypertensive retinopathy    OU   Peripheral vascular disease (HCC)    hx of DVT - left leg    Personal history of unspecified circulatory disease    Plantar fascial fibromatosis    Shortness of breath    with exertion    Past Surgical History:  Procedure Laterality Date   ABDOMINAL HYSTERECTOMY     CATARACT EXTRACTION Bilateral 2018   EYE SURGERY Bilateral 2018   Cat Sx   FLEXIBLE SIGMOIDOSCOPY     FOOT SURGERY Bilateral    History of Lumbar fusion     '87   SHOULDER ARTHROSCOPY WITH ROTATOR CUFF REPAIR AND SUBACROMIAL DECOMPRESSION Left 01/14/2013   Procedure: Left  Shoulder Arthroscopy with Debridement, Subacromial Decompression,rotator cuff repair;  Surgeon: Mcarthur Rossetti, MD;  Location: [facility];  Service: Orthopedics;  Laterality: Left;  Left Shoulder Arthroscopy with Debridement, Subacromial Decompression    FAMILY HISTORY Family History  Problem Relation Age of Onset   Leukemia Mother    Brain cancer Father        mets   Throat cancer Brother        x 2 brothers    SOCIAL HISTORY Social History   Tobacco Use   Smoking status: Never   Smokeless tobacco: Never  Vaping Use   Vaping Use: Never used  Substance Use Topics   Alcohol use: No   Drug use: No         OPHTHALMIC EXAM:  Base Eye Exam     Visual Acuity (Snellen - Linear)       Right Left   Dist [place] 20/25 -2 20/60   Dist ph [place] 20/25 +1 20/30 -1         Tonometry (Tonopen, 1:43 PM)       Right Left   Pressure 20 18         Pupils       Dark Light Shape React APD   Right 3 2 Round Minimal None   Left 3 2 Round Minimal None         Visual Fields (Counting fingers)       Left Right    Full Full         Extraocular Movement       Right Left    Full, Ortho Full, Ortho         Neuro/Psych     Oriented x3: Yes   Mood/Affect: Normal         Dilation     Both eyes: 1.0% Mydriacyl, 2.5% Phenylephrine @ 1:43 PM           Slit Lamp and Fundus Exam     Slit Lamp Exam       Right Left   Lids/Lashes Dermatochalasis - upper lid Dermatochalasis - upper lid   Conjunctiva/Sclera Melanosis, 1-2+Injection Melanosis, 1-2+ Injection   Cornea arcus, well healed temporal cataract wounds, trace Punctate epithelial erosions, early band K nasal and temporal arcus, well healed temporal cataract wounds, trace Punctate epithelial erosions, early band K nasal and temporal, mild tear film debris   Anterior Chamber Deep, 0.5+ cell/pigment Deep and quiet, 0.5+cell/pigment   Iris Round and dilated, mild atrophy Round and dilated, mild, diffuse atrophy    Lens PC IOL in good position PC IOL in good position   Vitreous Vitreous syneresis, +pigment, mild vitreous condensations Vitreous syneresis, fine pigment, vitreous condensations         Fundus Exam  Right Left   Disc Pink and Sharp, PPA/PPP Pink and Sharp, Compact, temporal PPA   C/D Ratio 0.5 0.5   Macula Flat, good foveal reflex, RPE mottling, drusen temporal macula, No heme or edema -- CME stably resolved Flat, Blunted foveal reflex, central cystic changes - stably improved, drusen, RPE mottling, no heme   Vessels attenuated, Tortuous, mild Copper wiring attenuated, mild tortuousity, mild Copper wiring   Periphery Attached, no heme    Attached, trace peripheral MA               IMAGING AND PROCEDURES  Imaging and Procedures for 07/15/2021  OCT, Retina - OU - Both Eyes       Right Eye Quality was good. Central Foveal Thickness: 275. Progression has been stable. Findings include no SRF, myopic contour, no IRF, normal foveal contour, retinal drusen (No CME).   Left Eye Quality was good. Central Foveal Thickness: 270. Progression has been stable. Findings include no SRF, myopic contour, no IRF, normal foveal contour (No CME).   Notes *Images captured and stored on drive  Diagnosis / Impression:  No CME OU  Clinical management:  See below  Abbreviations: NFP - Normal foveal profile. CME - cystoid macular edema. PED - pigment epithelial detachment. IRF - intraretinal fluid. SRF - subretinal fluid. EZ - ellipsoid zone. ERM - epiretinal membrane. ORA - outer retinal atrophy. ORT - outer retinal tubulation. SRHM - subretinal hyper-reflective material. IRHM - intraretinal hyper-reflective material            ASSESSMENT/PLAN:    ICD-10-CM   1. CME (cystoid macular edema), bilateral  H35.353     2. 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 Retinal edema  H35.81 OCT, Retina - OU - Both Eyes    4. Essential hypertension  I10     5. Hypertensive retinopathy  of both eyes  H35.033     6. Pseudophakia, both eyes  Z96.1     7. Primary open angle glaucoma (POAG) of both eyes, mild stage  H40.1131       1-3. CME OU - [place] / posterior uveitis OU  - pt with history of chronic CME post cataract surgery OU in 2018  - history of improvement on topical steroid and NSAID, but recurrence w/ tapering; also history of steroid response  - FA 01.24.22 shows hyperfluorescence of disc and perifoveal hyperfluorescence OU consistent with [place] CME; also shows focal peripheral hyperfluorescence OU consistent with posterior uveitis / inflammatory component  - currently using Lotemax SM and Prolensa one drop every other day OU  - IOP 20,18  - BCVA 20/25 OD, OS: stable at 20/30   - OCT shows stable improvement in cystic changes OU on 1 drop lotemax SM and Prolensa every other day  - pt reports increased eye pain / discomfort OU with every other day dosing  - Continue Lotemax SM 1 drop every other day OU  - increase prolensa to qdaily OU for eye pain / discomfort              - cont Brim and Cosopt to TID OU  - f/u 2-3 mo., DFE, OCT  4,5. Hypertensive retinopathy OU - discussed importance of tight BP control - monitor  6. Pseudophakia OU  - s/p CE/IOL OU (2018)  - IOLs in good position  - chronic CME as above  - monitor  7. POAG OU  - under the expert manage of Dr. Kathlen Mody  - mild stage, s/p  SLT OU x2, s/p goniotomy OU  - history of steroid response  - IOP 20,18 today  - on rocklatan qhs OU  - cont cosopt and brimonidine tid OU  - f/u 2-3 mo for IOP and CME check   Ophthalmic Meds Ordered this visit:  No orders of the defined types were placed in this encounter.     Return in about 2 months (around 09/14/2021) for f/u 2-3 mo for IOP and CME check.  There are no Patient Instructions on file for this visit.  This document serves as a record of services personally performed by Gardiner Sleeper, MD, PhD. It was created on their behalf by  Leeann Must, [place], an ophthalmic technician. The creation of this record is the provider's dictation and/or activities during the visit.    Electronically signed by: Leeann Must, COA '@TODAY'$ @ 8:06 PM  Gardiner Sleeper, M.D., Ph.D. Diseases & Surgery of the Retina and [place] 07/15/2021   I have reviewed the above documentation for accuracy and completeness, and I agree with the above. Gardiner Sleeper, M.D., Ph.D. 07/15/21 8:06 PM   Abbreviations: M myopia (nearsighted); A astigmatism; H hyperopia (farsighted); P presbyopia; Mrx spectacle prescription;  CTL contact lenses; OD right eye; OS left eye; OU both eyes  XT exotropia; ET esotropia; PEK punctate epithelial keratitis; PEE punctate epithelial erosions; DES dry eye syndrome; MGD meibomian gland dysfunction; ATs artificial tears; PFAT's preservative free artificial tears; [place] nuclear sclerotic cataract; PSC posterior subcapsular cataract; ERM epi-retinal membrane; PVD posterior vitreous detachment; RD retinal detachment; DM diabetes mellitus; DR diabetic retinopathy; NPDR non-proliferative diabetic retinopathy; PDR proliferative diabetic retinopathy; CSME clinically significant macular edema; DME diabetic macular edema; dbh dot blot hemorrhages; CWS cotton wool spot; POAG primary open angle glaucoma; C/D cup-to-disc ratio; HVF humphrey visual field; GVF goldmann visual field; OCT optical coherence tomography; IOP intraocular pressure; BRVO Branch retinal vein occlusion; CRVO central retinal vein occlusion; CRAO central retinal artery occlusion; BRAO branch retinal artery occlusion; RT retinal tear; SB scleral buckle; PPV pars plana vitrectomy; VH Vitreous hemorrhage; PRP panretinal laser photocoagulation; IVK intravitreal kenalog; VMT vitreomacular traction; MH Macular hole;  NVD neovascularization of the disc; NVE neovascularization elsewhere; AREDS age related eye disease study; ARMD age related macular  degeneration; POAG primary open angle glaucoma; EBMD epithelial/anterior basement membrane dystrophy; ACIOL anterior chamber intraocular lens; IOL intraocular lens; PCIOL posterior chamber intraocular lens; Phaco/IOL phacoemulsification with intraocular lens placement; [place] photorefractive keratectomy; LASIK laser assisted in situ keratomileusis; HTN hypertension; DM diabetes mellitus; COPD chronic obstructive pulmonary disease

## 2021-07-12 NOTE — Patient Instructions (Signed)
Visit Information  Phone number for Pharmacist: [phone]   Goals Addressed   None     Care Plan : [place]  Updates made by Charlton Haws, RPH since 07/12/2021 12:00 AM     Problem: Hypertension, Hyperlipidemia, Chronic Kidney Disease, Gout and Insomnia   Priority: High     Long-Range Goal: Disease management   Start Date: 04/26/2021  Expected End Date: 04/26/2022  This Visit's Progress: On track  Recent Progress: On track  Priority: High  Note:   Current Barriers:  Unable to independently monitor therapeutic efficacy  Pharmacist Clinical Goal(s):  Patient will achieve adherence to monitoring guidelines and medication adherence to achieve therapeutic efficacy through collaboration with PharmD and provider.   Interventions: 1:1 collaboration with [name], MD regarding development and update of comprehensive plan of care as evidenced by provider attestation and co-signature Inter-disciplinary care team collaboration (see longitudinal plan of care) Comprehensive medication review performed; medication list updated in electronic medical record  Hypertension / CKD stage 3b    BP goal < 140/90 Patient checks BP at home: no now, batteries are dead in monitor Patient home BP readings are ranging: n/a  Patient has failed these meds in the past: valsartan, chlorthalidone, HCTZ, metoprolol, losartan Patient is currently controlled on the following medications:  Diltiazem 300 mg qAM Nebivolol 20 mg daily Hydralazine 50 mg TID Losartan 25 mg daily   We discussed: pt endorses compliance with medications as prescribed; she is not checking BP due to dead battery in her monitor; she plans to replace the battery soon and start checking periodically   Plan: Continue current medications    Hyperlipidemia    LDL goal < 100  Patient has failed these meds in past: n/a Patient is currently controlled on the following medications:  Atorvastatin 10 mg  daily   We discussed:  LDL is at goal; pt endorses compliance with statin   Plan: Continue current medications and control with diet and exercise   Gout    Patient has failed these meds in past: allopurinol Patient is currently uncontrolled on the following medications:  Allopurinol 100 mg daily   We discussed:  pt endorses compliance; she denies recent gout flares; recent thumb pain was determined to be related to trigger finger and resolved with steroid injection   Plan: Continue current medications   Insomnia/Anxiety    Patient has failed these meds in past: n/a Patient is currently controlled on the following medications:  Zolpidem 5 mg HS prn   We discussed:  Pt reports 5 mg dose does not help at all; she often takes 2 to be able to sleep; she reports most of her sleep difficulties come from back pain; she takes 2 Tylenol Extra Strength at bedtime and this is often enough to help her sleep; she is interested in getting new rx for zolpidem 10 mg; discussed this dose was previously reduced due to Beers criteria and dangers of >5 mg in women over 65. Pt is adamant that she has never had issues with 10 mg dose. Advised she discuss with PCP   Plan: Continue current medications  Patient Goals/Self-Care Activities Patient will:  - take medications as prescribed -focus on medication adherence by routine -check blood pressure daily, document, and provide at future appointments        Patient verbalizes understanding of instructions provided today and agrees to view in [place].  Telephone follow up appointment with pharmacy team member scheduled for: 6 months  Charlene Brooke, PharmD, [place], CPP Clinical Pharmacist [facility] [phone]

## 2021-07-15 ENCOUNTER — Other Ambulatory Visit: Payer: Self-pay

## 2021-07-15 ENCOUNTER — Ambulatory Visit (INDEPENDENT_AMBULATORY_CARE_PROVIDER_SITE_OTHER): Payer: Medicare Other | Admitting: Ophthalmology

## 2021-07-15 ENCOUNTER — Encounter (INDEPENDENT_AMBULATORY_CARE_PROVIDER_SITE_OTHER): Payer: Self-pay | Admitting: Ophthalmology

## 2021-07-15 DIAGNOSIS — H3093 Unspecified chorioretinal inflammation, bilateral: Secondary | ICD-10-CM | POA: Diagnosis not present

## 2021-07-15 DIAGNOSIS — H35353 Cystoid macular degeneration, bilateral: Secondary | ICD-10-CM | POA: Diagnosis not present

## 2021-07-15 DIAGNOSIS — I10 Essential (primary) hypertension: Secondary | ICD-10-CM

## 2021-07-15 DIAGNOSIS — H401131 Primary open-angle glaucoma, bilateral, mild stage: Secondary | ICD-10-CM

## 2021-07-15 DIAGNOSIS — H35033 Hypertensive retinopathy, bilateral: Secondary | ICD-10-CM

## 2021-07-15 DIAGNOSIS — H3581 Retinal edema: Secondary | ICD-10-CM | POA: Diagnosis not present

## 2021-07-15 DIAGNOSIS — Z961 Presence of intraocular lens: Secondary | ICD-10-CM

## 2021-08-09 ENCOUNTER — Other Ambulatory Visit: Payer: Self-pay | Admitting: Internal Medicine

## 2021-08-09 DIAGNOSIS — E790 Hyperuricemia without signs of inflammatory arthritis and tophaceous disease: Secondary | ICD-10-CM

## 2021-08-09 DIAGNOSIS — N1832 Chronic kidney disease, stage 3b: Secondary | ICD-10-CM | POA: Diagnosis not present

## 2021-08-09 DIAGNOSIS — M1A39X0 Chronic gout due to renal impairment, multiple sites, without tophus (tophi): Secondary | ICD-10-CM

## 2021-08-09 DIAGNOSIS — N189 Chronic kidney disease, unspecified: Secondary | ICD-10-CM | POA: Diagnosis not present

## 2021-08-09 LAB — CBC AND DIFFERENTIAL: Hemoglobin: 10.9 — AB (ref 12.0–16.0)

## 2021-08-09 LAB — BASIC METABOLIC PANEL
BUN: 41 — AB (ref 4–21)
Creatinine: 1.5 — AB (ref 0.5–1.1)
Potassium: 4.6 (ref 3.4–5.3)

## 2021-08-09 LAB — COMPREHENSIVE METABOLIC PANEL: GFR calc Af Amer: 35

## 2021-08-16 ENCOUNTER — Other Ambulatory Visit: Payer: Self-pay | Admitting: Internal Medicine

## 2021-08-16 DIAGNOSIS — J4540 Moderate persistent asthma, uncomplicated: Secondary | ICD-10-CM

## 2021-09-03 ENCOUNTER — Ambulatory Visit: Payer: Medicare Other | Admitting: Internal Medicine

## 2021-09-09 ENCOUNTER — Other Ambulatory Visit: Payer: Self-pay | Admitting: Internal Medicine

## 2021-09-09 DIAGNOSIS — E785 Hyperlipidemia, unspecified: Secondary | ICD-10-CM

## 2021-09-11 NOTE — Progress Notes (Addendum)
[facility] Note  09/16/2021     CHIEF COMPLAINT Patient presents for Retina Follow Up  HISTORY OF PRESENT ILLNESS: Sarah [name] is a 80 y.o. female who presents to the clinic today for:  HPI     Retina Follow Up   Patient presents with  Other.  In both eyes.  Severity is mild.  Duration of 2 months.  Since onset it is stable.  I, the attending physician,  performed the HPI with the patient and updated documentation appropriately.        Comments   Pt here for 2 month ret f/u for bilateral CME. Pt states no vision changes or issues, no ocular pain or discomfort. Pt reports taking Dorzolamid/Timolol TID OU, Brimonidine TID OU, Rocklatan QHS OU, Prolensa 1gtt QD OU, Lotemax 1 gtts q other day OU.       Last edited by [name], MD on 09/16/2021 10:18 PM.    Pt states she is still using Prolensa once a day, she states Dr. 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   Bromfenac Sodium (PROLENSA) 0.07 % SOLN Place 1 drop into both eyes daily.   dorzolamide-timolol (COSOPT) 22.3-6.8 MG/ML ophthalmic solution    Loteprednol Etabonate (LOTEMAX SM) 0.38 % GEL Place one drop in both eyes every other day   Netarsudil-Latanoprost (ROCKLATAN) 0.02-0.005 % SOLN Apply to eye.   No current facility-administered medications for this visit. (Ophthalmic Drugs)   Current Outpatient Medications (Other)  Medication Sig   acetaminophen (TYLENOL) 500 MG tablet Take 500 mg by mouth every 6 (six) hours as needed.   allopurinol (ZYLOPRIM) 100 MG tablet TAKE 1  TABLET(100 MG) BY MOUTH DAILY   atorvastatin (LIPITOR) 10 MG tablet TAKE 1 TABLET(10 MG) BY MOUTH DAILY   diltiazem (TIAZAC) 300 MG 24 hr capsule TAKE 1 CAPSULE BY MOUTH DAILY BEFORE BREAKFAST   hydrALAZINE (APRESOLINE) 50 MG tablet Take 1 tablet (50 mg total) by mouth 3 (three) times daily.   losartan (COZAAR) 25 MG tablet Take 0.5 tablets (12.5 mg total) by mouth daily.   Nebivolol HCl 20 MG TABS TAKE 1 TABLET(20 MG) BY MOUTH DAILY   SYMBICORT 160-4.5 MCG/ACT inhaler INHALE 2 PUFFS INTO THE LUNGS TWICE DAILY   zolpidem (AMBIEN) 5 MG tablet TAKE 1 TABLET(5 MG) BY MOUTH AT BEDTIME AS NEEDED FOR SLEEP   No current facility-administered medications for this visit. (Other)   REVIEW OF SYSTEMS: ROS   Positive for: Neurological, Musculoskeletal, Eyes, Respiratory Negative for: Constitutional, Gastrointestinal, Skin, Genitourinary, HENT, Endocrine, Cardiovascular, Psychiatric, Allergic/Imm, Heme/Lymph Last edited by Kingsley Spittle, COT on 09/16/2021  1:10 PM.    ALLERGIES Allergies  Allergen Reactions   Lisinopril     REACTION: Lightheaded "sick"   PAST MEDICAL HISTORY Past Medical History:  Diagnosis Date   Allergic rhinitis    Arthritis    Chronic back pain    Contact dermatitis and other eczema, due to unspecified cause    Diabetes mellitus without complication ([place])    Dysrhythmia    hx of heart beating fast    Glaucoma  POAG OU   History of sinusitis    Hx of adenomatous colonic polyps 10/17/2014   Hypertension    Hypertensive retinopathy    OU   Peripheral vascular disease (HCC)    hx of DVT - left leg    Personal history of unspecified circulatory disease    Plantar fascial fibromatosis    Shortness of breath    with exertion    Past Surgical History:  Procedure Laterality Date   ABDOMINAL HYSTERECTOMY     CATARACT EXTRACTION Bilateral 2018   EYE SURGERY Bilateral 2018   Cat Sx   FLEXIBLE SIGMOIDOSCOPY     FOOT SURGERY Bilateral    History of Lumbar  fusion     '87   SHOULDER ARTHROSCOPY WITH ROTATOR CUFF REPAIR AND SUBACROMIAL DECOMPRESSION Left 01/14/2013   Procedure: Left Shoulder Arthroscopy with Debridement, Subacromial Decompression,rotator cuff repair;  Surgeon: Mcarthur Rossetti, MD;  Location: [facility];  Service: Orthopedics;  Laterality: Left;  Left Shoulder Arthroscopy with Debridement, Subacromial Decompression   FAMILY HISTORY Family History  Problem Relation Age of Onset   Leukemia Mother    Brain cancer Father        mets   Throat cancer Brother        x 2 brothers   SOCIAL HISTORY Social History   Tobacco Use   Smoking status: Never   Smokeless tobacco: Never  Vaping Use   Vaping Use: Never used  Substance Use Topics   Alcohol use: No   Drug use: No       OPHTHALMIC EXAM: Base Eye Exam     Visual Acuity (Snellen - Linear)       Right Left   Dist [place] 20/25 +2 20/40   Dist ph  NI 20/25 -1         Tonometry (Tonopen, 1:19 PM)       Right Left   Pressure 20 16         Pupils       Dark Light Shape React APD   Right 32 3 2 Round Minimal None   Left 3 2 Round  Minimal None         Visual Fields (Counting fingers)       Left Right    Full Full         Extraocular Movement       Right Left    Full, Ortho Full, Ortho         Neuro/Psych     Oriented x3: Yes   Mood/Affect: Normal         Dilation     Both eyes: 1.0% Mydriacyl, 2.5% Phenylephrine @ 1:20 PM           Slit Lamp and Fundus Exam     Slit Lamp Exam       Right Left   Lids/Lashes Dermatochalasis - upper lid Dermatochalasis - upper lid   Conjunctiva/Sclera Melanosis, 1+Injection Melanosis, 1-2+ Injection   Cornea arcus, well healed temporal cataract wounds, trace Punctate epithelial erosions, early band K nasal and temporal arcus, well healed temporal cataract wounds, trace Punctate epithelial erosions, early band K nasal and temporal   Anterior Chamber deep and clear, no cell or flare deep and  clear, no cell or flare   Iris Round and dilated, mild atrophy Round and dilated, mild, diffuse atrophy   Lens PC IOL in good position PC IOL in good position   Vitreous Vitreous syneresis, +pigment, mild vitreous condensations Vitreous  syneresis, fine pigment, vitreous condensations         Fundus Exam       Right Left   Disc Pink and Sharp, PPA/PPP Pink and Sharp, Compact, temporal PPA   C/D Ratio 0.5 0.5   Macula Flat, good foveal reflex, RPE mottling, drusen temporal macula, No heme or edema -- CME stably resolved Flat, Blunted foveal reflex, central cystic changes - stably improved, drusen, RPE mottling, no heme   Vessels attenuated, Tortuous, mild Copper wiring attenuated, mild tortuousity, mild Copper wiring   Periphery Attached, no heme    Attached, trace peripheral MA              IMAGING AND PROCEDURES  Imaging and Procedures for 09/16/2021  OCT, Retina - OU - Both Eyes       Right Eye Quality was good. Central Foveal Thickness: 270. Progression has been stable. Findings include no SRF, myopic contour, no IRF, normal foveal contour, retinal drusen (No CME).   Left Eye Quality was good. Central Foveal Thickness: 263. Progression has been stable. Findings include no SRF, myopic contour, no IRF, normal foveal contour (No CME).   Notes *Images captured and stored on drive  Diagnosis / Impression:  No CME OU  Clinical management:  See below  Abbreviations: NFP - Normal foveal profile. CME - cystoid macular edema. PED - pigment epithelial detachment. IRF - intraretinal fluid. SRF - subretinal fluid. EZ - ellipsoid zone. ERM - epiretinal membrane. ORA - outer retinal atrophy. ORT - outer retinal tubulation. SRHM - subretinal hyper-reflective material. IRHM - intraretinal hyper-reflective material            ASSESSMENT/PLAN:    ICD-10-CM   1. CME (cystoid macular edema), bilateral  H35.353     2. 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 Retinal edema   H35.81 OCT, Retina - OU - Both Eyes    4. Essential hypertension  I10     5. Hypertensive retinopathy of both eyes  H35.033     6. Pseudophakia, both eyes  Z96.1     7. Primary open angle glaucoma (POAG) of both eyes, mild stage  H40.1131     1-3. CME OU - [place] / posterior uveitis OU  - pt with history of chronic CME post cataract surgery OU in 2018  - history of improvement on topical steroid and NSAID, but recurrence w/ tapering; also history of steroid response  - FA 01.24.22 shows hyperfluorescence of disc and perifoveal hyperfluorescence OU consistent with [place] CME; also shows focal peripheral hyperfluorescence OU consistent with posterior uveitis / inflammatory component  - currently using Lotemax SM one drop every other day OU and Prolensa Qdaily OU  - IOP 20,16  - BCVA 20/25 OU  - OCT shows stable resolution of CME OU  - pt denies eye pain today, but complains of tearinng  - Continue Lotemax SM 1 drop every other day OU - cont Prolensa qdaily OU             - cont Brim and Cosopt to TID OU  - f/u 4 mo -- DFE, OCT  4,5. Hypertensive retinopathy OU - discussed importance of tight BP control - monitor  6. Pseudophakia OU  - s/p CE/IOL OU (2018)  - IOLs in good position  - chronic CME as above  - monitor  7. POAG OU  - under the expert manage of Dr. Kathlen Mody  - mild stage, s/p SLT OU x2, s/p goniotomy OU  -  history of steroid response  - IOP 20,16 today  - on rocklatan qhs OU  - cont cosopt and brimonidine tid OU  - f/u 4 mo for IOP and CME check   Ophthalmic Meds Ordered this visit:  Meds ordered this encounter  Medications   Bromfenac Sodium (PROLENSA) 0.07 % SOLN    Sig: Place 1 drop into both eyes daily.    Dispense:  6 mL    Refill:  3   Loteprednol Etabonate (LOTEMAX SM) 0.38 % GEL    Sig: Place one drop in both eyes every other day    Dispense:  5 g    Refill:  5     Return in about 4 months (around 01/17/2022) for f/u CME OU, DFE,  OCT.  There are no Patient Instructions on file for this visit.  This document serves as a record of services personally performed by Gardiner Sleeper, MD, PhD. It was created on their behalf by Leeann Must, [place], an ophthalmic technician. The creation of this record is the provider's dictation and/or activities during the visit.    Electronically signed by: Leeann Must, COA _0 @ 10:27 PM  This document serves as a record of services personally performed by Gardiner Sleeper, MD, PhD. It was created on their behalf by San Jetty. [place] Shark, OA an ophthalmic technician. The creation of this record is the provider's dictation and/or activities during the visit.    Electronically signed by: San Jetty. [place], [place] 10.17.2022 10:27 PM  Gardiner Sleeper, M.D., Ph.D. Diseases & Surgery of the Retina and [place] 09/16/2021   I have reviewed the above documentation for accuracy and completeness, and I agree with the above. Gardiner Sleeper, M.D., Ph.D. 09/16/21 10:27 PM   Abbreviations: M myopia (nearsighted); A astigmatism; H hyperopia (farsighted); P presbyopia; Mrx spectacle prescription;  CTL contact lenses; OD right eye; OS left eye; OU both eyes  XT exotropia; ET esotropia; PEK punctate epithelial keratitis; PEE punctate epithelial erosions; DES dry eye syndrome; MGD meibomian gland dysfunction; ATs artificial tears; PFAT's preservative free artificial tears; [place] nuclear sclerotic cataract; PSC posterior subcapsular cataract; ERM epi-retinal membrane; PVD posterior vitreous detachment; RD retinal detachment; DM diabetes mellitus; DR diabetic retinopathy; NPDR non-proliferative diabetic retinopathy; PDR proliferative diabetic retinopathy; CSME clinically significant macular edema; DME diabetic macular edema; dbh dot blot hemorrhages; CWS cotton wool spot; POAG primary open angle glaucoma; C/D cup-to-disc ratio; HVF humphrey visual field; GVF goldmann visual field; OCT  optical coherence tomography; IOP intraocular pressure; BRVO Branch retinal vein occlusion; CRVO central retinal vein occlusion; CRAO central retinal artery occlusion; BRAO branch retinal artery occlusion; RT retinal tear; SB scleral buckle; PPV pars plana vitrectomy; VH Vitreous hemorrhage; PRP panretinal laser photocoagulation; IVK intravitreal kenalog; VMT vitreomacular traction; MH Macular hole;  NVD neovascularization of the disc; NVE neovascularization elsewhere; AREDS age related eye disease study; ARMD age related macular degeneration; POAG primary open angle glaucoma; EBMD epithelial/anterior basement membrane dystrophy; ACIOL anterior chamber intraocular lens; IOL intraocular lens; PCIOL posterior chamber intraocular lens; Phaco/IOL phacoemulsification with intraocular lens placement; [place] photorefractive keratectomy; LASIK laser assisted in situ keratomileusis; HTN hypertension; DM diabetes mellitus; COPD chronic obstructive pulmonary disease

## 2021-09-16 ENCOUNTER — Encounter (INDEPENDENT_AMBULATORY_CARE_PROVIDER_SITE_OTHER): Payer: Self-pay | Admitting: Ophthalmology

## 2021-09-16 ENCOUNTER — Other Ambulatory Visit: Payer: Self-pay

## 2021-09-16 ENCOUNTER — Ambulatory Visit (INDEPENDENT_AMBULATORY_CARE_PROVIDER_SITE_OTHER): Payer: Medicare Other | Admitting: Ophthalmology

## 2021-09-16 DIAGNOSIS — I10 Essential (primary) hypertension: Secondary | ICD-10-CM | POA: Diagnosis not present

## 2021-09-16 DIAGNOSIS — H35033 Hypertensive retinopathy, bilateral: Secondary | ICD-10-CM

## 2021-09-16 DIAGNOSIS — H35353 Cystoid macular degeneration, bilateral: Secondary | ICD-10-CM

## 2021-09-16 DIAGNOSIS — H3581 Retinal edema: Secondary | ICD-10-CM

## 2021-09-16 DIAGNOSIS — Z961 Presence of intraocular lens: Secondary | ICD-10-CM

## 2021-09-16 DIAGNOSIS — H401131 Primary open-angle glaucoma, bilateral, mild stage: Secondary | ICD-10-CM

## 2021-09-16 DIAGNOSIS — H3093 Unspecified chorioretinal inflammation, bilateral: Secondary | ICD-10-CM

## 2021-09-16 MED ORDER — PROLENSA 0.07 % OP SOLN: 1.0000 [drp] | Freq: Every day | OPHTHALMIC | 3 refills | Status: DC

## 2021-09-16 MED ORDER — LOTEMAX SM 0.38 % OP GEL: OPHTHALMIC | 5 refills | Status: DC

## 2021-09-25 ENCOUNTER — Other Ambulatory Visit: Payer: Self-pay | Admitting: Internal Medicine

## 2021-09-25 DIAGNOSIS — I10 Essential (primary) hypertension: Secondary | ICD-10-CM

## 2021-09-25 MED ORDER — DILTIAZEM HCL ER BEADS 300 MG PO CP24: ORAL_CAPSULE | ORAL | 0 refills | Status: DC

## 2021-09-26 ENCOUNTER — Encounter: Payer: Self-pay | Admitting: Orthopedic Surgery

## 2021-09-26 ENCOUNTER — Ambulatory Visit: Payer: Medicare Other | Admitting: Orthopedic Surgery

## 2021-09-26 ENCOUNTER — Other Ambulatory Visit: Payer: Self-pay

## 2021-09-26 DIAGNOSIS — M25532 Pain in left wrist: Secondary | ICD-10-CM | POA: Insufficient documentation

## 2021-09-26 MED ORDER — METHYLPREDNISOLONE 4 MG PO TBPK: ORAL_TABLET | ORAL | 0 refills | Status: DC

## 2021-09-26 NOTE — Progress Notes (Signed)
Office Visit Note   Patient: Sarah [name]           Date of Birth: 1941-08-22           MRN: [id number] Visit Date: 09/26/2021              Requested by: [name], MD [address] [place],  [place] [address] PCP: [name], MD   Assessment & Plan: Visit Diagnoses:  1. Pain in left wrist     Plan: Discussed with patient that her symptoms seem like a gout flare.  An acute infection seems less likely given that she has no systemic symptoms and has no scrapes, cuts, bites, or other sites of inoculation.  Her last gout flare was approximately a year ago.  I will treat her for presumed gout flare with a medrol dosepak.  Discussed with patient and daughter that she needs to call the office or present to the ER if her symptoms worsen after taking the steroid or fail to improve after a few days.    Follow-Up Instructions: No follow-ups on file.   Orders:  No orders of the defined types were placed in this encounter.  Meds ordered this encounter  Medications   methylPREDNISolone (MEDROL DOSEPAK) 4 MG TBPK tablet    Sig: Take as instructed on packaging.    Dispense:  1 each    Refill:  0      Procedures: No procedures performed   Clinical Data: No additional findings.   Subjective: Chief Complaint  Patient presents with   Left Hand - New Patient (Initial Visit)    This is a 80 yo RHD F who presents w/ atraumatic left hand and wrist pain for several days.  She woke up on Monday morning w/ severe pain in the left hand and wrist.  It has progressively worsened.  She does have a history of gout, takes allopurinol, and last had a flare approximately one year ago.  Her pain is worse w/ any attempted wrist or finger ROM.  She denies recent illness or systemic symptoms.  She denies recent cuts, scrapes, bites, or other breaks in the skin of her hand.     Review of Systems   Objective: Vital Signs: BP 132/66 (BP Location: Right Arm, Patient Position: Sitting, Cuff  Size: Large)   Pulse 63   Ht 5\' 2"  (1.575 m)   Wt 144 lb (65.3 kg)   SpO2 97%   BMI 26.34 kg/m   Physical Exam Constitutional:      Appearance: Normal appearance.  Cardiovascular:     Rate and Rhythm: Normal rate.     Pulses: Normal pulses.  Pulmonary:     Effort: Pulmonary effort is normal.  Skin:    General: Skin is warm and dry.     Capillary Refill: Capillary refill takes less than 2 seconds.  Neurological:     Mental Status: She is alert.    Left Hand Exam   Tenderness  Left hand tenderness location: TTP at volar and dorsal wrist and in fingers.   Other  Erythema: absent Sensation: normal Pulse: present  Comments:  Moderate swelling of wrist, hand, and fingers.  Pain w/ attempted ROM of wrist.  Unable to make fist secondary to pain and swelling.  No significant erythema.  No cuts, scrapes, or breaks in the skin.      Specialty Comments:  No specialty comments available.  Imaging: No results found.   PMFS History: Patient  Active Problem List   Diagnosis Date Noted   Pain in left wrist 09/26/2021   Chronic gout due to renal impairment of multiple sites without tophus 02/20/2021   Anemia, chronic disease 10/10/2019   Plantar fasciitis, left 10/10/2019   Osteoarthritis of both feet 10/06/2018   CRI (chronic renal insufficiency), stage 3 (moderate) 09/21/2018   Vitamin D deficiency disease 09/21/2018   OAB (overactive bladder) 10/15/2017   Insomnia due to anxiety and fear 06/01/2017   Gastroesophageal reflux disease with esophagitis 10/20/2016   Asthma in adult, moderate persistent, uncomplicated 09/73/5329   Allergic rhinitis due to pollen 04/19/2015   Type II diabetes mellitus with manifestations ([place]) 08/14/2014   Hyperlipidemia with target LDL less than 100 08/14/2014   Hyperuricemia 02/29/2012   Routine health maintenance 09/10/2011   Essential hypertension 08/20/2007   Past Medical History:  Diagnosis Date   Allergic rhinitis    Arthritis     Chronic back pain    Contact dermatitis and other eczema, due to unspecified cause    Diabetes mellitus without complication ([facility])    Dysrhythmia    hx of heart beating fast    Glaucoma    POAG OU   History of sinusitis    Hx of adenomatous colonic polyps 10/17/2014   Hypertension    Hypertensive retinopathy    OU   Peripheral vascular disease (HCC)    hx of DVT - left leg    Personal history of unspecified circulatory disease    Plantar fascial fibromatosis    Shortness of breath    with exertion     Family History  Problem Relation Age of Onset   Leukemia Mother    Brain cancer Father        mets   Throat cancer Brother        x 2 brothers    Past Surgical History:  Procedure Laterality Date   ABDOMINAL HYSTERECTOMY     CATARACT EXTRACTION Bilateral 2018   EYE SURGERY Bilateral 2018   Cat Sx   FLEXIBLE SIGMOIDOSCOPY     FOOT SURGERY Bilateral    History of Lumbar fusion     '87   SHOULDER ARTHROSCOPY WITH ROTATOR CUFF REPAIR AND SUBACROMIAL DECOMPRESSION Left 01/14/2013   Procedure: Left Shoulder Arthroscopy with Debridement, Subacromial Decompression,rotator cuff repair;  Surgeon: Mcarthur Rossetti, MD;  Location: [facility];  Service: Orthopedics;  Laterality: Left;  Left Shoulder Arthroscopy with Debridement, Subacromial Decompression   Social History   Occupational History   Occupation: retired  Tobacco Use   Smoking status: Never   Smokeless tobacco: Never  Vaping Use   Vaping Use: Never used  Substance and Sexual Activity   Alcohol use: No   Drug use: No   Sexual activity: Not Currently

## 2021-09-27 DIAGNOSIS — N1832 Chronic kidney disease, stage 3b: Secondary | ICD-10-CM | POA: Diagnosis not present

## 2021-09-27 DIAGNOSIS — D649 Anemia, unspecified: Secondary | ICD-10-CM | POA: Diagnosis not present

## 2021-09-27 DIAGNOSIS — N281 Cyst of kidney, acquired: Secondary | ICD-10-CM | POA: Diagnosis not present

## 2021-09-27 DIAGNOSIS — I129 Hypertensive chronic kidney disease with stage 1 through stage 4 chronic kidney disease, or unspecified chronic kidney disease: Secondary | ICD-10-CM | POA: Diagnosis not present

## 2021-10-01 ENCOUNTER — Ambulatory Visit: Payer: Medicare Other | Admitting: Orthopedic Surgery

## 2021-10-08 ENCOUNTER — Ambulatory Visit: Payer: Medicare Other | Admitting: Orthopedic Surgery

## 2021-10-08 ENCOUNTER — Encounter: Payer: Self-pay | Admitting: Internal Medicine

## 2021-10-08 ENCOUNTER — Other Ambulatory Visit: Payer: Self-pay

## 2021-10-08 ENCOUNTER — Ambulatory Visit (INDEPENDENT_AMBULATORY_CARE_PROVIDER_SITE_OTHER): Payer: Medicare Other | Admitting: Internal Medicine

## 2021-10-08 VITALS — BP 144/66 | HR 65 | Temp 98.9°F | Resp 16 | Ht 62.0 in | Wt 148.0 lb

## 2021-10-08 DIAGNOSIS — E790 Hyperuricemia without signs of inflammatory arthritis and tophaceous disease: Secondary | ICD-10-CM | POA: Diagnosis not present

## 2021-10-08 DIAGNOSIS — M1A39X0 Chronic gout due to renal impairment, multiple sites, without tophus (tophi): Secondary | ICD-10-CM

## 2021-10-08 DIAGNOSIS — M25532 Pain in left wrist: Secondary | ICD-10-CM | POA: Diagnosis not present

## 2021-10-08 DIAGNOSIS — E785 Hyperlipidemia, unspecified: Secondary | ICD-10-CM

## 2021-10-08 DIAGNOSIS — I10 Essential (primary) hypertension: Secondary | ICD-10-CM

## 2021-10-08 DIAGNOSIS — F409 Phobic anxiety disorder, unspecified: Secondary | ICD-10-CM

## 2021-10-08 DIAGNOSIS — M10332 Gout due to renal impairment, left wrist: Secondary | ICD-10-CM | POA: Insufficient documentation

## 2021-10-08 DIAGNOSIS — D638 Anemia in other chronic diseases classified elsewhere: Secondary | ICD-10-CM

## 2021-10-08 DIAGNOSIS — I129 Hypertensive chronic kidney disease with stage 1 through stage 4 chronic kidney disease, or unspecified chronic kidney disease: Secondary | ICD-10-CM

## 2021-10-08 DIAGNOSIS — N183 Chronic kidney disease, stage 3 unspecified: Secondary | ICD-10-CM | POA: Diagnosis not present

## 2021-10-08 DIAGNOSIS — F5105 Insomnia due to other mental disorder: Secondary | ICD-10-CM

## 2021-10-08 MED ORDER — ZOLPIDEM TARTRATE 5 MG PO TABS: ORAL_TABLET | ORAL | 1 refills | Status: DC

## 2021-10-08 MED ORDER — HYDRALAZINE HCL 50 MG PO TABS: 50.0000 mg | ORAL_TABLET | Freq: Three times a day (TID) | ORAL | 1 refills | Status: DC

## 2021-10-08 MED ORDER — DILTIAZEM HCL ER BEADS 300 MG PO CP24: ORAL_CAPSULE | ORAL | 1 refills | Status: DC

## 2021-10-08 MED ORDER — OXYCODONE HCL 5 MG PO TABS: 5.0000 mg | ORAL_TABLET | ORAL | 0 refills | Status: AC | PRN

## 2021-10-08 MED ORDER — LOSARTAN POTASSIUM 25 MG PO TABS
12.5000 mg | ORAL_TABLET | Freq: Every day | ORAL | 1 refills | Status: DC
Start: 2021-10-08 — End: 2022-01-21

## 2021-10-08 MED ORDER — ATORVASTATIN CALCIUM 10 MG PO TABS: 10.0000 mg | ORAL_TABLET | Freq: Every day | ORAL | 1 refills | Status: DC

## 2021-10-08 MED ORDER — ALLOPURINOL 100 MG PO TABS
200.0000 mg | ORAL_TABLET | Freq: Every day | ORAL | 0 refills | Status: DC
Start: 2021-10-08 — End: 2022-01-04

## 2021-10-08 NOTE — Progress Notes (Signed)
Subjective:  Patient ID: Sarah [name], female    DOB: May 18, 1941  Age: 80 y.o. MRN: [id number]  CC: Follow-up  This visit occurred during the SARS-CoV-2 public health emergency.  Safety protocols were in place, including screening questions prior to the visit, additional usage of staff PPE, and extensive cleaning of exam room while observing appropriate contact time as indicated for disinfecting solutions.    HPI [name] presents for f/up -  She complains of persistent pain and swelling in the left wrist.  There is no history of trauma.  She was recently seen by an orthopedist and has completed a course of methylprednisolone.  She is not getting much symptom relief with Tylenol.  NSAIDs are contraindicated.  Outpatient Medications Prior to Visit  Medication Sig Dispense Refill   acetaminophen (TYLENOL) 500 MG tablet Take 500 mg by mouth every 6 (six) hours as needed.     brimonidine (ALPHAGAN) 0.2 % ophthalmic solution 1 drop 3 (three) times daily.     Bromfenac Sodium (PROLENSA) 0.07 % SOLN Place 1 drop into both eyes daily. 6 mL 3   dorzolamide-timolol (COSOPT) 22.3-6.8 MG/ML ophthalmic solution      Loteprednol Etabonate (LOTEMAX SM) 0.38 % GEL Place one drop in both eyes every other day 5 g 5   Nebivolol HCl 20 MG TABS TAKE 1 TABLET(20 MG) BY MOUTH DAILY 90 tablet 0   Netarsudil-Latanoprost (ROCKLATAN) 0.02-0.005 % SOLN Apply to eye.     SYMBICORT 160-4.5 MCG/ACT inhaler INHALE 2 PUFFS INTO THE LUNGS TWICE DAILY 30.6 g 5   allopurinol (ZYLOPRIM) 100 MG tablet TAKE 1 TABLET(100 MG) BY MOUTH DAILY 90 tablet 0   atorvastatin (LIPITOR) 10 MG tablet TAKE 1 TABLET(10 MG) BY MOUTH DAILY 90 tablet 0   diltiazem (TIAZAC) 300 MG 24 hr capsule TAKE 1 CAPSULE BY MOUTH DAILY BEFORE BREAKFAST 90 capsule 0   hydrALAZINE (APRESOLINE) 50 MG tablet Take 1 tablet (50 mg total) by mouth 3 (three) times daily. 270 tablet 0   losartan (COZAAR) 25 MG tablet Take 0.5 tablets (12.5 mg total) by mouth  daily. 90 tablet 0   methylPREDNISolone (MEDROL DOSEPAK) 4 MG TBPK tablet Take as instructed on packaging. 1 each 0   zolpidem (AMBIEN) 5 MG tablet TAKE 1 TABLET(5 MG) BY MOUTH AT BEDTIME AS NEEDED FOR SLEEP 90 tablet 1   No facility-administered medications prior to visit.    ROS Review of Systems  Constitutional:  Negative for diaphoresis and fatigue.  HENT: Negative.    Eyes: Negative.   Respiratory:  Negative for chest tightness, shortness of breath and wheezing.   Cardiovascular:  Negative for chest pain, palpitations and leg swelling.  Gastrointestinal:  Negative for abdominal pain and nausea.  Endocrine: Negative.   Genitourinary: Negative.  Negative for difficulty urinating.  Musculoskeletal:  Positive for arthralgias. Negative for back pain.  Skin: Negative.   Neurological:  Negative for dizziness, weakness and light-headedness.  Hematological:  Negative for adenopathy. Does not bruise/bleed easily.  Psychiatric/Behavioral:  Positive for sleep disturbance. Negative for dysphoric mood. The patient is nervous/anxious.    Objective:  BP (!) 144/66 (BP Location: Right Arm, Patient Position: Sitting, Cuff Size: Large)   Pulse 65   Temp 98.9 F (37.2 C) (Oral)   Resp 16   Ht 5\' 2"  (1.575 m)   Wt 148 lb (67.1 kg)   SpO2 97%   BMI 27.07 kg/m   BP Readings from Last 3 Encounters:  10/08/21 (!) 144/66  09/26/21  132/66  02/18/21 (!) 150/70    Wt Readings from Last 3 Encounters:  10/08/21 148 lb (67.1 kg)  09/26/21 144 lb (65.3 kg)  02/18/21 144 lb (65.3 kg)    Physical Exam Vitals reviewed.  HENT:     Nose: Nose normal.     Mouth/Throat:     Mouth: Mucous membranes are moist.  Eyes:     Conjunctiva/sclera: Conjunctivae normal.  Cardiovascular:     Rate and Rhythm: Normal rate and regular rhythm.     Heart sounds: No murmur heard. Pulmonary:     Effort: Pulmonary effort is normal.     Breath sounds: No stridor. No wheezing, rhonchi or rales.  Abdominal:      General: Abdomen is flat.     Palpations: There is no mass.     Tenderness: There is no abdominal tenderness. There is no guarding.  Musculoskeletal:        General: Swelling present.     Cervical back: Neck supple.     Right lower leg: No edema.     Left lower leg: No edema.     Comments: Dorsum of left wrist is mildly swollen and tender.  Lymphadenopathy:     Cervical: No cervical adenopathy.  Neurological:     General: No focal deficit present.  Psychiatric:        Mood and Affect: Mood normal.        Behavior: Behavior normal.    Lab Results  Component Value Date   WBC 5.6 02/18/2021   HGB 10.9 (A) 08/09/2021   HCT 33.9 (L) 02/18/2021   PLT 206.0 02/18/2021   GLUCOSE 84 02/18/2021   CHOL 144 02/18/2021   TRIG 53.0 02/18/2021   HDL 73.50 02/18/2021   LDLCALC 60 02/18/2021   ALT 13 02/18/2021   AST 15 02/18/2021   NA 141 02/18/2021   K 4.6 08/09/2021   CL 108 02/18/2021   CREATININE 1.5 (A) 08/09/2021   BUN 41 (A) 08/09/2021   CO2 20 02/18/2021   TSH 1.68 02/18/2021   HGBA1C 5.7 02/18/2021   MICROALBUR 0.8 09/21/2018    US RENAL  Result Date: 11/11/2018 CLINICAL DATA:  Stage III chronic kidney disease, hypertension, diabetes mellitus EXAM: RENAL / URINARY TRACT ULTRASOUND COMPLETE COMPARISON:  None FINDINGS: Right Kidney: Renal measurements: 7.8 x 4.0 x 4.0 cm = volume: 65.8 mL. Cortical thinning. Increased cortical echogenicity. No mass, hydronephrosis or shadowing calcification Left Kidney: Renal measurements: 8.9 x 4.5 x 4.5 cm = volume: 92.1 mL. Cortical thinning. Increased cortical echogenicity. Simple appearing cyst at LEFT kidney 4.4 x 4.1 x 4.5 cm. No additional mass or hydronephrosis. No shadowing calcification. Bladder: Appears normal for degree of bladder distention. 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 Simple appearing LEFT renal cyst 4.5 cm diameter. Electronically Signed   By: Lavonia Dana M.D.   On: 11/11/2018  09:28    Assessment & Plan:   Jilene was seen today for follow-up.  Diagnoses and all orders for this visit:  Essential hypertension- Her blood pressure is adequately well controlled. -     CBC with Differential/Platelet; Future -     Basic metabolic panel; Future -     diltiazem (TIAZAC) 300 MG 24 hr capsule; TAKE 1 CAPSULE BY MOUTH DAILY BEFORE BREAKFAST -     hydrALAZINE (APRESOLINE) 50 MG tablet; Take 1 tablet (50 mg total) by mouth 3 (three) times daily. -     losartan (COZAAR) 25 MG tablet; Take  0.5 tablets (12.5 mg total) by mouth daily.  CRI (chronic renal insufficiency), stage 3 (moderate) -     Basic metabolic panel; Future  Anemia, chronic disease -     CBC with Differential/Platelet; Future  Hyperuricemia -     allopurinol (ZYLOPRIM) 100 MG tablet; Take 2 tablets (200 mg total) by mouth daily.  Chronic gout due to renal impairment of multiple sites without tophus- Will increase the dose of the xanthine oxidase inhibitor. -     allopurinol (ZYLOPRIM) 100 MG tablet; Take 2 tablets (200 mg total) by mouth daily.  Hyperlipidemia with target LDL less than 100 -     atorvastatin (LIPITOR) 10 MG tablet; Take 1 tablet (10 mg total) by mouth daily.  Insomnia due to anxiety and fear -     zolpidem (AMBIEN) 5 MG tablet; TAKE 1 TABLET(5 MG) BY MOUTH AT BEDTIME AS NEEDED FOR SLEEP  Acute gout due to renal impairment involving left wrist- Will control the pain with oxycodone. -     oxyCODONE (OXY IR/ROXICODONE) 5 MG immediate release tablet; Take 1 tablet (5 mg total) by mouth every 4 (four) hours as needed for up to 7 days for severe pain.  I have discontinued Loney Hering. Broxson's allopurinol and methylPREDNISolone. I have also changed her atorvastatin. Additionally, I am having her start on oxyCODONE and allopurinol. Lastly, I am having her maintain her dorzolamide-timolol, acetaminophen, brimonidine, Rocklatan, Symbicort, Prolensa, Lotemax SM, Nebivolol HCl, diltiazem, hydrALAZINE,  losartan, and zolpidem.  Meds ordered this encounter  Medications   atorvastatin (LIPITOR) 10 MG tablet    Sig: Take 1 tablet (10 mg total) by mouth daily.    Dispense:  90 tablet    Refill:  1   diltiazem (TIAZAC) 300 MG 24 hr capsule    Sig: TAKE 1 CAPSULE BY MOUTH DAILY BEFORE BREAKFAST    Dispense:  90 capsule    Refill:  1   hydrALAZINE (APRESOLINE) 50 MG tablet    Sig: Take 1 tablet (50 mg total) by mouth 3 (three) times daily.    Dispense:  270 tablet    Refill:  1   losartan (COZAAR) 25 MG tablet    Sig: Take 0.5 tablets (12.5 mg total) by mouth daily.    Dispense:  90 tablet    Refill:  1   zolpidem (AMBIEN) 5 MG tablet    Sig: TAKE 1 TABLET(5 MG) BY MOUTH AT BEDTIME AS NEEDED FOR SLEEP    Dispense:  90 tablet    Refill:  1   oxyCODONE (OXY IR/ROXICODONE) 5 MG immediate release tablet    Sig: Take 1 tablet (5 mg total) by mouth every 4 (four) hours as needed for up to 7 days for severe pain.    Dispense:  30 tablet    Refill:  0   allopurinol (ZYLOPRIM) 100 MG tablet    Sig: Take 2 tablets (200 mg total) by mouth daily.    Dispense:  180 tablet    Refill:  0      Follow-up: Return in about 3 months (around 01/08/2022).  Scarlette Calico, MD

## 2021-10-08 NOTE — Progress Notes (Signed)
Office Visit Note   Patient: Sarah [name]           Date of Birth: 08-Sep-1941           MRN: [id number] Visit Date: 10/08/2021              Requested by: [name], MD [address] [place],  [place] [address] PCP: [name], MD   Assessment & Plan: Visit Diagnoses:  1. Pain in left wrist     Plan: Patient presented approximately week and half ago with severe left wrist and hand pain with inability make a fist.  She was put on a Medrol Dosepak for presumed gout given her previous gout history.  Her symptoms are much improved with the steroid treatment.  She is able to make a complete fist.  She still has some moments where it is painful.  Discussed that this should continue to improve.  She is seeing her primary care doctor today and should discuss further treatment of her gout with him.  I am happy to see her back as needed over the next month or 2 if she still has problems.  Follow-Up Instructions: No follow-ups on file.   Orders:  No orders of the defined types were placed in this encounter.  No orders of the defined types were placed in this encounter.     Procedures: No procedures performed   Clinical Data: No additional findings.   Subjective: Chief Complaint  Patient presents with   Left Hand - Follow-up    States that it is some better, but it is still painful, able to close it more.     This is a 80 year old 15 female who presents for follow-up of atraumatic left hand pain and wrist pain.  She was last seen on 2027 at which time she was having severe pain in both left hand and wrist.  She was able to make a complete fist and has pain with any attempted range of motion of the wrist or fingers.  He was put on a Medrol dose pack which she is completed.  Today she reports that her pain is much better.  Her swelling is greatly improved.  She is able to make a complete fist now.  She is able to use her hand.  She still has moments where she has some  pain.   Review of Systems   Objective: Vital Signs: There were no vitals taken for this visit.  Physical Exam Constitutional:      Appearance: Normal appearance.  Cardiovascular:     Rate and Rhythm: Normal rate.     Pulses: Normal pulses.  Pulmonary:     Effort: Pulmonary effort is normal.  Skin:    General: Skin is warm and dry.     Capillary Refill: Capillary refill takes less than 2 seconds.  Neurological:     Mental Status: She is alert.    Left Hand Exam   Tenderness  Left hand tenderness location: Minimal TTP at wrist.   Other  Erythema: absent Sensation: normal Pulse: present  Comments:  Able to make a complete fist.  No pain w/ gentle ROM at wrist.  Still w/ mild wrist pain w/ AROM.      Specialty Comments:  No specialty comments available.  Imaging: No results found.   PMFS History: Patient Active Problem List   Diagnosis Date Noted   Pain in left wrist 09/26/2021   Chronic gout due to renal impairment  of multiple sites without tophus 02/20/2021   Anemia, chronic disease 10/10/2019   Plantar fasciitis, left 10/10/2019   Osteoarthritis of both feet 10/06/2018   CRI (chronic renal insufficiency), stage 3 (moderate) 09/21/2018   Vitamin D deficiency disease 09/21/2018   OAB (overactive bladder) 10/15/2017   Insomnia due to anxiety and fear 06/01/2017   Gastroesophageal reflux disease with esophagitis 10/20/2016   Asthma in adult, moderate persistent, uncomplicated 62/94/7654   Allergic rhinitis due to pollen 04/19/2015   Type II diabetes mellitus with manifestations ([place]) 08/14/2014   Hyperlipidemia with target LDL less than 100 08/14/2014   Hyperuricemia 02/29/2012   Routine health maintenance 09/10/2011   Essential hypertension 08/20/2007   Past Medical History:  Diagnosis Date   Allergic rhinitis    Arthritis    Chronic back pain    Contact dermatitis and other eczema, due to unspecified cause    Diabetes mellitus without complication  (HCC)    Dysrhythmia    hx of heart beating fast    Glaucoma    POAG OU   History of sinusitis    Hx of adenomatous colonic polyps 10/17/2014   Hypertension    Hypertensive retinopathy    OU   Peripheral vascular disease (HCC)    hx of DVT - left leg    Personal history of unspecified circulatory disease    Plantar fascial fibromatosis    Shortness of breath    with exertion     Family History  Problem Relation Age of Onset   Leukemia Mother    Brain cancer Father        mets   Throat cancer Brother        x 2 brothers    Past Surgical History:  Procedure Laterality Date   ABDOMINAL HYSTERECTOMY     CATARACT EXTRACTION Bilateral 2018   EYE SURGERY Bilateral 2018   Cat Sx   FLEXIBLE SIGMOIDOSCOPY     FOOT SURGERY Bilateral    History of Lumbar fusion     '87   SHOULDER ARTHROSCOPY WITH ROTATOR CUFF REPAIR AND SUBACROMIAL DECOMPRESSION Left 01/14/2013   Procedure: Left Shoulder Arthroscopy with Debridement, Subacromial Decompression,rotator cuff repair;  Surgeon: Mcarthur Rossetti, MD;  Location: [facility];  Service: Orthopedics;  Laterality: Left;  Left Shoulder Arthroscopy with Debridement, Subacromial Decompression   Social History   Occupational History   Occupation: retired  Tobacco Use   Smoking status: Never   Smokeless tobacco: Never  Vaping Use   Vaping Use: Never used  Substance and Sexual Activity   Alcohol use: No   Drug use: No   Sexual activity: Not Currently

## 2021-10-08 NOTE — Patient Instructions (Signed)
Gout ?Gout is a condition that causes painful swelling of the joints. Gout is a type of inflammation of the joints (arthritis). This condition is caused by having too much uric acid in the body. Uric acid is a chemical that forms when the body breaks down substances called purines. Purines are important for building body proteins. ?When the body has too much uric acid, sharp crystals can form and build up inside the joints. This causes pain and swelling. Gout attacks can happen quickly and may be very painful (acute gout). Over time, the attacks can affect more joints and become more frequent (chronic gout). Gout can also cause uric acid to build up under the skin and inside the kidneys. ?What are the causes? ?This condition is caused by too much uric acid in your blood. This can happen because: ?Your kidneys do not remove enough uric acid from your blood. This is the most common cause. ?Your body makes too much uric acid. This can happen with some cancers and cancer treatments. It can also occur if your body is breaking down too many red blood cells (hemolytic anemia). ?You eat too many foods that are high in purines. These foods include organ meats and some seafood. Alcohol, especially beer, is also high in purines. ?A gout attack may be triggered by trauma or stress. ?What increases the risk? ?You are more likely to develop this condition if you: ?Have a family history of gout. ?Are female and middle-aged. ?Are female and have gone through menopause. ?Are obese. ?Frequently drink alcohol, especially beer. ?Are dehydrated. ?Lose weight too quickly. ?Have an organ transplant. ?Have lead poisoning. ?Take certain medicines, including aspirin, cyclosporine, diuretics, levodopa, and niacin. ?Have kidney disease. ?Have a skin condition called psoriasis. ?What are the signs or symptoms? ?An attack of acute gout happens quickly. It usually occurs in just one joint. The most common place is the big toe. Attacks often start  at night. Other joints that may be affected include joints of the feet, ankle, knee, fingers, wrist, or elbow. Symptoms of this condition may include: ?Severe pain. ?Warmth. ?Swelling. ?Stiffness. ?Tenderness. The affected joint may be very painful to touch. ?Shiny, red, or purple skin. ?Chills and fever. ?Chronic gout may cause symptoms more frequently. More joints may be involved. You may also have white or yellow lumps (tophi) on your hands or feet or in other areas near your joints. ?How is this diagnosed? ?This condition is diagnosed based on your symptoms, medical history, and physical exam. You may have tests, such as: ?Blood tests to measure uric acid levels. ?Removal of joint fluid with a thin needle (aspiration) to look for uric acid crystals. ?X-rays to look for joint damage. ?How is this treated? ?Treatment for this condition has two phases: treating an acute attack and preventing future attacks. Acute gout treatment may include medicines to reduce pain and swelling, including: ?NSAIDs. ?Steroids. These are strong anti-inflammatory medicines that can be taken by mouth (orally) or injected into a joint. ?Colchicine. This medicine relieves pain and swelling when it is taken soon after an attack. It can be given by mouth or through an IV. ?Preventive treatment may include: ?Daily use of smaller doses of NSAIDs or colchicine. ?Use of a medicine that reduces uric acid levels in your blood. ?Changes to your diet. You may need to see a dietitian about what to eat and drink to prevent gout. ?Follow these instructions at home: ?During a gout attack ? ?If directed, put ice on the affected area: ?  Put ice in a plastic bag. ?Place a towel between your skin and the bag. ?Leave the ice on for 20 minutes, 2-3 times a day. ?Raise (elevate) the affected joint above the level of your heart as often as possible. ?Rest the joint as much as possible. If the affected joint is in your leg, you may be given crutches to  use. ?Follow instructions from your health care provider about eating or drinking restrictions. ?Avoiding future gout attacks ?Follow a low-purine diet as told by your dietitian or health care provider. Avoid foods and drinks that are high in purines, including liver, kidney, anchovies, asparagus, herring, mushrooms, mussels, and beer. ?Maintain a healthy weight or lose weight if you are overweight. If you want to lose weight, talk with your health care provider. It is important that you do not lose weight too quickly. ?Start or maintain an exercise program as told by your health care provider. ?Eating and drinking ?Drink enough fluids to keep your urine pale yellow. ?If you drink alcohol: ?Limit how much you use to: ?0-1 drink a day for women. ?0-2 drinks a day for men. ?Be aware of how much alcohol is in your drink. In the [place] one drink equals one 12 oz bottle of beer (355 mL) one 5 oz glass of wine (148 mL), or one 1? oz glass of hard liquor (44 mL). ?General instructions ?Take over-the-counter and prescription medicines only as told by your health care provider. ?Do not drive or use heavy machinery while taking prescription pain medicine. ?Return to your normal activities as told by your health care provider. Ask your health care provider what activities are safe for you. ?Keep all follow-up visits as told by your health care provider. This is important. ?Contact a health care provider if you have: ?Another gout attack. ?Continuing symptoms of a gout attack after 10 days of treatment. ?Side effects from your medicines. ?Chills or a fever. ?Burning pain when you urinate. ?Pain in your lower back or belly. ?Get help right away if you: ?Have severe or uncontrolled pain. ?Cannot urinate. ?Summary ?Gout is painful swelling of the joints caused by inflammation. ?The most common site of pain is the big toe, but it can affect other joints in the body. ?Medicines and dietary changes can help to prevent and treat gout  attacks. ?This information is not intended to replace advice given to you by your health care provider. Make sure you discuss any questions you have with your health care provider. ?Document Revised: 05/28/2018 Document Reviewed: 06/09/2018 ?Elsevier Patient Education ? 2022 [employer] ? ?

## 2021-11-07 ENCOUNTER — Telehealth: Payer: Self-pay

## 2021-11-07 NOTE — Chronic Care Management (AMB) (Signed)
Chronic Care Management Pharmacy Assistant   Name: Sarah [name]  MRN: [id number] DOB: Sep 14, 1941   Reason for Encounter: Disease State   Conditions to be addressed/monitored: HTN   Recent office visits:  10/08/21 Sarah Lima, MD-PCP (Essential hypertension) Orders: labs, med changes: discontinued Sarah [name]. Sarah [name]'s allopurinol and methylPREDNISolone. I have also changed her atorvastatin and start on oxyCODONE and allopurinol.  Recent consult visits:  10/08/21 Sarah Cooter, MD-Orthopedic Surgery (Left wrist pain) No orders or med changes  07/15/21 Sarah Caffey, MD-Ophthalmology (Retina Follow Up) Orders: OCT, Retina both eyes, no med changes  Hospital visits:  None in previous 6 months  Medications: Outpatient Encounter Medications as of 11/07/2021  Medication Sig Note   acetaminophen (TYLENOL) 500 MG tablet Take 500 mg by mouth every 6 (six) hours as needed.    allopurinol (ZYLOPRIM) 100 MG tablet Take 2 tablets (200 mg total) by mouth daily.    atorvastatin (LIPITOR) 10 MG tablet Take 1 tablet (10 mg total) by mouth daily.    brimonidine (ALPHAGAN) 0.2 % ophthalmic solution 1 drop 3 (three) times daily.    Bromfenac Sodium (PROLENSA) 0.07 % SOLN Place 1 drop into both eyes daily.    diltiazem (TIAZAC) 300 MG 24 hr capsule TAKE 1 CAPSULE BY MOUTH DAILY BEFORE BREAKFAST    dorzolamide-timolol (COSOPT) 22.3-6.8 MG/ML ophthalmic solution     hydrALAZINE (APRESOLINE) 50 MG tablet Take 1 tablet (50 mg total) by mouth 3 (three) times daily.    losartan (COZAAR) 25 MG tablet Take 0.5 tablets (12.5 mg total) by mouth daily.    Loteprednol Etabonate (LOTEMAX SM) 0.38 % GEL Place one drop in both eyes every other day    Nebivolol HCl 20 MG TABS TAKE 1 TABLET(20 MG) BY MOUTH DAILY    Netarsudil-Latanoprost (ROCKLATAN) 0.02-0.005 % SOLN Apply to eye. 05/06/2021: Instill 1 drop qhs OU   SYMBICORT 160-4.5 MCG/ACT inhaler INHALE 2 PUFFS INTO THE LUNGS TWICE DAILY    zolpidem (AMBIEN) 5  MG tablet TAKE 1 TABLET(5 MG) BY MOUTH AT BEDTIME AS NEEDED FOR SLEEP    [DISCONTINUED] valsartan (DIOVAN) 160 MG tablet Take 1 tablet (160 mg total) by mouth daily.    No facility-administered encounter medications on file as of 11/07/2021.   Reviewed chart prior to disease state call. Spoke with patient regarding BP  Recent Office Vitals: BP Readings from Last 3 Encounters:  10/08/21 (!) 144/66  09/26/21 132/66  02/18/21 (!) 150/70   Pulse Readings from Last 3 Encounters:  10/08/21 65  09/26/21 63  02/18/21 74    Wt Readings from Last 3 Encounters:  10/08/21 148 lb (67.1 kg)  09/26/21 144 lb (65.3 kg)  02/18/21 144 lb (65.3 kg)     Kidney Function Lab Results  Component Value Date/Time   CREATININE 1.5 (A) 08/09/2021 12:00 AM   CREATININE 1.59 (H) 02/18/2021 02:23 PM   CREATININE 1.63 (H) 04/24/2020 02:20 PM   GFR 30.83 (L) 02/18/2021 02:23 PM   GFRNONAA 54 (L) 01/07/2013 10:50 AM   GFRAA 35 08/09/2021 12:00 AM    BMP Latest Ref Rng & Units 08/09/2021 02/18/2021 04/24/2020  Glucose 70 - 99 mg/dL - 84 109(H)  BUN 4 - 21 41(A) 37(H) 45(H)  Creatinine 0.5 - 1.1 1.5(A) 1.59(H) 1.63(H)  Sodium 135 - 145 mEq/L - 141 134(L)  Potassium 3.4 - 5.3 4.6 4.5 4.1  Chloride 96 - 112 mEq/L - 108 104  CO2 19 - 32 mEq/L - 20 21  Calcium 8.4 -  10.5 mg/dL - 10.0 9.6    Current antihypertensive regimen:  Losartan 25 mg daily Diltiazem 300 mg qAM Nebivolol 20 mg daily Hydralazine 50 mg TID  How often are you checking your Blood Pressure?  Patient states that she has not taken blood pressure recently because of a broken cuff. She stated that she is not having any dizziness,or swelling  Current home BP readings: Patient states she can't remember last blood pressure reading  What recent interventions/DTPs have been made by any provider to improve Blood Pressure control since last CPP Visit: None noted  Any recent hospitalizations or ED visits since last visit with CPP? No  What diet  changes have been made to improve Blood Pressure Control?  Patient states that she has to watch what she eats because she can't have certain foods, and stays away from salt What exercise is being done to improve your Blood Pressure Control?  Patient states to stay active she will walk up and down her driveway if it is not to cold  Adherence Review: Is the patient currently on ACE/ARB medication? Yes, losartan Does the patient have >5 day gap between last estimated fill dates? No   Care Gaps: Colonoscopy-NA Diabetic Foot Exam-09/22/18 Mammogram-NA Ophthalmology-07/15/21 Dexa Scan - NA Annual Well Visit - 02/18/21 Micro albumin-NA Hemoglobin A1c- 02/18/21  Star Rating Drugs: Atorvastatin 10 mg-last fill 10/08/21 90 ds Losartan 25 mg-last fill 10/08/21 90 ds  Sarah [name] Clinical Pharmacist Assistant [phone]

## 2021-12-24 ENCOUNTER — Other Ambulatory Visit: Payer: Self-pay | Admitting: Internal Medicine

## 2021-12-24 DIAGNOSIS — I10 Essential (primary) hypertension: Secondary | ICD-10-CM

## 2021-12-27 ENCOUNTER — Other Ambulatory Visit: Payer: Self-pay | Admitting: Internal Medicine

## 2021-12-27 DIAGNOSIS — I10 Essential (primary) hypertension: Secondary | ICD-10-CM

## 2022-01-04 ENCOUNTER — Other Ambulatory Visit: Payer: Self-pay | Admitting: Internal Medicine

## 2022-01-04 DIAGNOSIS — M1A39X0 Chronic gout due to renal impairment, multiple sites, without tophus (tophi): Secondary | ICD-10-CM

## 2022-01-04 DIAGNOSIS — E790 Hyperuricemia without signs of inflammatory arthritis and tophaceous disease: Secondary | ICD-10-CM

## 2022-01-08 ENCOUNTER — Other Ambulatory Visit (INDEPENDENT_AMBULATORY_CARE_PROVIDER_SITE_OTHER): Payer: Self-pay

## 2022-01-08 MED ORDER — PROLENSA 0.07 % OP SOLN: 1.0000 [drp] | Freq: Every day | OPHTHALMIC | 3 refills | Status: DC

## 2022-01-09 NOTE — Progress Notes (Signed)
[facility] Note  01/17/2022     CHIEF COMPLAINT Patient presents for Retina Follow Up   HISTORY OF PRESENT ILLNESS: Sarah [name] is a 81 y.o. female who presents to the clinic today for:  HPI     Retina Follow Up   Patient presents with  Other.  In both eyes.  This started 4 months ago.  I, the attending physician,  performed the HPI with the patient and updated documentation appropriately.        Comments   Patient here for 4 months retina follow up for CME OU. Patient states vision is ok. Can't see fine or small print. No eye pain.      Last edited by [name], MD on 01/17/2022  1:09 PM.    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   Bromfenac Sodium (PROLENSA) 0.07 % SOLN Place 1 drop into both eyes daily.   dorzolamide-timolol (COSOPT) 22.3-6.8 MG/ML ophthalmic solution Place 1 drop into both eyes in the morning, at noon, and at bedtime.   Loteprednol Etabonate (LOTEMAX SM) 0.38 % GEL Place one drop in both eyes every other day   Netarsudil-Latanoprost (ROCKLATAN) 0.02-0.005 % SOLN Apply to eye.   No current facility-administered medications for this visit. (Ophthalmic Drugs)   Current Outpatient Medications (Other)  Medication Sig   acetaminophen (TYLENOL) 500 MG tablet Take 500 mg by mouth every 6 (six) hours as needed.   allopurinol (ZYLOPRIM) 100  MG tablet TAKE 2 TABLETS(200 MG) BY MOUTH DAILY   atorvastatin (LIPITOR) 10 MG tablet Take 1 tablet (10 mg total) by mouth daily.   diltiazem (TIAZAC) 300 MG 24 hr capsule TAKE 1 CAPSULE BY MOUTH DAILY BEFORE AND BREAKFAST   hydrALAZINE (APRESOLINE) 50 MG tablet Take 1 tablet (50 mg total) by mouth 3 (three) times daily.   losartan (COZAAR) 25 MG tablet Take 0.5 tablets (12.5 mg total) by mouth daily.   Nebivolol HCl 20 MG TABS TAKE 1 TABLET(20 MG) BY MOUTH DAILY   SYMBICORT 160-4.5 MCG/ACT inhaler INHALE 2 PUFFS INTO THE LUNGS TWICE DAILY   zolpidem (AMBIEN) 5 MG tablet TAKE 1 TABLET(5 MG) BY MOUTH AT BEDTIME AS NEEDED FOR SLEEP   No current facility-administered medications for this visit. (Other)   REVIEW OF SYSTEMS: ROS   Positive for: Neurological, Musculoskeletal, Eyes, Respiratory Negative for: Constitutional, Gastrointestinal, Skin, Genitourinary, HENT, Endocrine, Cardiovascular, Psychiatric, Allergic/Imm, Heme/Lymph Last edited by Theodore Demark, COA on 01/17/2022  1:03 PM.     ALLERGIES Allergies  Allergen Reactions   Lisinopril     REACTION: Lightheaded "sick"   PAST MEDICAL HISTORY Past Medical History:  Diagnosis Date   Allergic rhinitis    Arthritis    Chronic back pain    Contact dermatitis and other eczema, due to unspecified cause    Diabetes mellitus without complication ([place])    Dysrhythmia    hx of heart beating fast  Glaucoma    POAG OU   History of sinusitis    Hx of adenomatous colonic polyps 10/17/2014   Hypertension    Hypertensive retinopathy    OU   Peripheral vascular disease (HCC)    hx of DVT - left leg    Personal history of unspecified circulatory disease    Plantar fascial fibromatosis    Shortness of breath    with exertion    Past Surgical History:  Procedure Laterality Date   ABDOMINAL HYSTERECTOMY     CATARACT EXTRACTION Bilateral 2018   EYE SURGERY Bilateral 2018   Cat Sx   FLEXIBLE SIGMOIDOSCOPY     FOOT SURGERY  Bilateral    History of Lumbar fusion     '87   SHOULDER ARTHROSCOPY WITH ROTATOR CUFF REPAIR AND SUBACROMIAL DECOMPRESSION Left 01/14/2013   Procedure: Left Shoulder Arthroscopy with Debridement, Subacromial Decompression,rotator cuff repair;  Surgeon: Mcarthur Rossetti, MD;  Location: [facility];  Service: Orthopedics;  Laterality: Left;  Left Shoulder Arthroscopy with Debridement, Subacromial Decompression   FAMILY HISTORY Family History  Problem Relation Age of Onset   Leukemia Mother    Brain cancer Father        mets   Throat cancer Brother        x 2 brothers   SOCIAL HISTORY Social History   Tobacco Use   Smoking status: Never   Smokeless tobacco: Never  Vaping Use   Vaping Use: Never used  Substance Use Topics   Alcohol use: No   Drug use: No       OPHTHALMIC EXAM: Base Eye Exam     Visual Acuity (Snellen - Linear)       Right Left   Dist [place] 20/25 -2 20/25   Dist ph  NI 20/25 -2         Tonometry (Tonopen, 1:00 PM)       Right Left   Pressure 21 22         Pupils       Dark Light Shape React APD   Right 3 2 Round Minimal None   Left 3 2 Round Minimal None         Visual Fields (Counting fingers)       Left Right    Full Full         Extraocular Movement       Right Left    Full, Ortho Full, Ortho         Neuro/Psych     Oriented x3: Yes   Mood/Affect: Normal         Dilation     Both eyes: 1.0% Mydriacyl, 2.5% Phenylephrine @ 1:00 PM           Slit Lamp and Fundus Exam     Slit Lamp Exam       Right Left   Lids/Lashes Dermatochalasis - upper lid Dermatochalasis - upper lid   Conjunctiva/Sclera Melanosis, 1+Injection Melanosis, 1+ Injection   Cornea arcus, well healed temporal cataract wounds, trace Punctate epithelial erosions, early band K nasal and temporal arcus, well healed temporal cataract wounds, trace Punctate epithelial erosions, early band K nasal and temporal   Anterior Chamber deep and clear,  0.5+pigment deep and clear, no cell or flare   Iris Round and dilated, mild atrophy Round and moderately dilated, mild, diffuse atrophy   Lens PC IOL in good position PC IOL in good position   Anterior Vitreous Vitreous syneresis, +pigment, mild vitreous condensations  Vitreous syneresis, fine pigment, vitreous condensations         Fundus Exam       Right Left   Disc Pink and Sharp, PPA/PPP Pink and Sharp, Compact, temporal PPA   C/D Ratio 0.5 0.5   Macula Flat, good foveal reflex, RPE mottling, drusen, No heme or edema -- CME stably resolved Flat, Blunted foveal reflex, central cystic changes - stably improved, drusen, RPE mottling, no heme   Vessels attenuated, Tortuous, mild Copper wiring attenuated, Tortuous, mild Copper wiring   Periphery Attached, rare MA Attached, trace peripheral MA           IMAGING AND PROCEDURES  Imaging and Procedures for 01/17/2022  OCT, Retina - OU - Both Eyes       Right Eye Quality was good. Central Foveal Thickness: 262. Progression has been stable. Findings include no SRF, myopic contour, no IRF, normal foveal contour, retinal drusen (No CME).   Left Eye Quality was good. Central Foveal Thickness: 256. Progression has been stable. Findings include no SRF, myopic contour, no IRF, normal foveal contour, retinal drusen (No CME).   Notes *Images captured and stored on drive  Diagnosis / Impression:  No CME OU  Clinical management:  See below  Abbreviations: NFP - Normal foveal profile. CME - cystoid macular edema. PED - pigment epithelial detachment. IRF - intraretinal fluid. SRF - subretinal fluid. EZ - ellipsoid zone. ERM - epiretinal membrane. ORA - outer retinal atrophy. ORT - outer retinal tubulation. SRHM - subretinal hyper-reflective material. IRHM - intraretinal hyper-reflective material            ASSESSMENT/PLAN:    ICD-10-CM   1. CME (cystoid macular edema), bilateral  H35.353 OCT, Retina - OU - Both Eyes    2. 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 Essential hypertension  I10     4. Hypertensive retinopathy of both eyes  H35.033     5. Pseudophakia, both eyes  Z96.1     6. Primary open angle glaucoma (POAG) of both eyes, mild stage  H40.1131      1,2. CME OU - [place] / posterior uveitis OU  - pt with history of chronic CME post cataract surgery OU in 2018  - history of improvement on topical steroid and NSAID, but recurrence w/ tapering; also history of steroid response   - FA 01.24.22 shows hyperfluorescence of disc and perifoveal hyperfluorescence OU consistent with [place] CME; also shows focal peripheral hyperfluorescence OU consistent with posterior uveitis / inflammatory component  - currently using Lotemax SM one drop every other day OU and Prolensa Qdaily OU  - IOP 21,22  - BCVA 20/25 OU  - OCT shows stable resolution of CME OU  - pt denies eye pain today, but complains of tearing   - Continue Lotemax SM 1 drop every other day OU - cont Prolensa qdaily OU             - cont Brim and Cosopt TID OU  - f/u 6 mo -- DFE, OCT  3,4. Hypertensive retinopathy OU - discussed importance of tight BP control - monitor   5. Pseudophakia OU  - s/p CE/IOL OU (2018)  - IOLs in good position  - chronic CME as above  - monitor   6. POAG OU  - mild stage, s/p SLT OU x2, s/p goniotomy OU  - history of steroid response  - IOP 21,22 today  - on rocklatan qhs OU  - cont cosopt  and brimonidine TID OU  - f/u 6 mo for IOP and CME check   Ophthalmic Meds Ordered this visit:  No orders of the defined types were placed in this encounter.    Return in about 6 months (around 07/17/2022) for f/u CME OU, DFE, OCT.  There are no Patient Instructions on file for this visit.  This document serves as a record of services personally performed by Gardiner Sleeper, MD, PhD. It was created on their behalf by Leonie Douglas, an ophthalmic technician. The creation of this record is the provider's dictation  and/or activities during the visit.    Electronically signed by: Leonie Douglas COA, 01/17/22  1:29 PM  This document serves as a record of services personally performed by Gardiner Sleeper, MD, PhD. It was created on their behalf by San Jetty. Owens Shark, OA an ophthalmic technician. The creation of this record is the provider's dictation and/or activities during the visit.    Electronically signed by: San Jetty. [place], [place] 02.17.2023 1:29 PM  Gardiner Sleeper, M.D., Ph.D. Diseases & Surgery of the Retina and Vitreous [facility]  I have reviewed the above documentation for accuracy and completeness, and I agree with the above. Gardiner Sleeper, M.D., Ph.D. 01/17/22 1:29 PM   Abbreviations: M myopia (nearsighted); A astigmatism; H hyperopia (farsighted); P presbyopia; Mrx spectacle prescription;  CTL contact lenses; OD right eye; OS left eye; OU both eyes  XT exotropia; ET esotropia; PEK punctate epithelial keratitis; PEE punctate epithelial erosions; DES dry eye syndrome; MGD meibomian gland dysfunction; ATs artificial tears; PFAT's preservative free artificial tears; [place] nuclear sclerotic cataract; PSC posterior subcapsular cataract; ERM epi-retinal membrane; PVD posterior vitreous detachment; RD retinal detachment; DM diabetes mellitus; DR diabetic retinopathy; NPDR non-proliferative diabetic retinopathy; PDR proliferative diabetic retinopathy; CSME clinically significant macular edema; DME diabetic macular edema; dbh dot blot hemorrhages; CWS cotton wool spot; POAG primary open angle glaucoma; C/D cup-to-disc ratio; HVF humphrey visual field; GVF goldmann visual field; OCT optical coherence tomography; IOP intraocular pressure; BRVO Branch retinal vein occlusion; CRVO central retinal vein occlusion; CRAO central retinal artery occlusion; BRAO branch retinal artery occlusion; RT retinal tear; SB scleral buckle; PPV pars plana vitrectomy; VH Vitreous hemorrhage; PRP panretinal laser  photocoagulation; IVK intravitreal kenalog; VMT vitreomacular traction; MH Macular hole;  NVD neovascularization of the disc; NVE neovascularization elsewhere; AREDS age related eye disease study; ARMD age related macular degeneration; POAG primary open angle glaucoma; EBMD epithelial/anterior basement membrane dystrophy; ACIOL anterior chamber intraocular lens; IOL intraocular lens; PCIOL posterior chamber intraocular lens; Phaco/IOL phacoemulsification with intraocular lens placement; [place] photorefractive keratectomy; LASIK laser assisted in situ keratomileusis; HTN hypertension; DM diabetes mellitus; COPD chronic obstructive pulmonary disease

## 2022-01-14 ENCOUNTER — Other Ambulatory Visit (INDEPENDENT_AMBULATORY_CARE_PROVIDER_SITE_OTHER): Payer: Self-pay

## 2022-01-14 ENCOUNTER — Telehealth: Payer: Medicare Other

## 2022-01-14 MED ORDER — DORZOLAMIDE HCL-TIMOLOL MAL 2-0.5 % OP SOLN: 1.0000 [drp] | Freq: Three times a day (TID) | OPHTHALMIC | 3 refills | Status: DC

## 2022-01-17 ENCOUNTER — Encounter (INDEPENDENT_AMBULATORY_CARE_PROVIDER_SITE_OTHER): Payer: Self-pay | Admitting: Ophthalmology

## 2022-01-17 ENCOUNTER — Other Ambulatory Visit: Payer: Self-pay

## 2022-01-17 ENCOUNTER — Ambulatory Visit (INDEPENDENT_AMBULATORY_CARE_PROVIDER_SITE_OTHER): Payer: Medicare Other | Admitting: Ophthalmology

## 2022-01-17 DIAGNOSIS — I10 Essential (primary) hypertension: Secondary | ICD-10-CM | POA: Diagnosis not present

## 2022-01-17 DIAGNOSIS — H35033 Hypertensive retinopathy, bilateral: Secondary | ICD-10-CM | POA: Diagnosis not present

## 2022-01-17 DIAGNOSIS — Z961 Presence of intraocular lens: Secondary | ICD-10-CM

## 2022-01-17 DIAGNOSIS — H35353 Cystoid macular degeneration, bilateral: Secondary | ICD-10-CM | POA: Diagnosis not present

## 2022-01-17 DIAGNOSIS — H401131 Primary open-angle glaucoma, bilateral, mild stage: Secondary | ICD-10-CM

## 2022-01-17 DIAGNOSIS — H3093 Unspecified chorioretinal inflammation, bilateral: Secondary | ICD-10-CM

## 2022-01-21 ENCOUNTER — Ambulatory Visit (INDEPENDENT_AMBULATORY_CARE_PROVIDER_SITE_OTHER): Payer: Medicare Other | Admitting: Internal Medicine

## 2022-01-21 ENCOUNTER — Encounter: Payer: Self-pay | Admitting: Internal Medicine

## 2022-01-21 ENCOUNTER — Other Ambulatory Visit: Payer: Self-pay

## 2022-01-21 VITALS — BP 158/60 | HR 60 | Temp 97.8°F | Resp 16 | Ht 62.0 in | Wt 149.0 lb

## 2022-01-21 DIAGNOSIS — M1A39X0 Chronic gout due to renal impairment, multiple sites, without tophus (tophi): Secondary | ICD-10-CM

## 2022-01-21 DIAGNOSIS — D638 Anemia in other chronic diseases classified elsewhere: Secondary | ICD-10-CM | POA: Diagnosis not present

## 2022-01-21 DIAGNOSIS — I10 Essential (primary) hypertension: Secondary | ICD-10-CM

## 2022-01-21 DIAGNOSIS — N183 Chronic kidney disease, stage 3 unspecified: Secondary | ICD-10-CM | POA: Diagnosis not present

## 2022-01-21 DIAGNOSIS — J301 Allergic rhinitis due to pollen: Secondary | ICD-10-CM

## 2022-01-21 LAB — BASIC METABOLIC PANEL
BUN: 44 mg/dL — ABNORMAL HIGH (ref 6–23)
CO2: 21 mEq/L (ref 19–32)
Calcium: 10 mg/dL (ref 8.4–10.5)
Chloride: 108 mEq/L (ref 96–112)
Creatinine, Ser: 1.63 mg/dL — ABNORMAL HIGH (ref 0.40–1.20)
GFR: 29.73 mL/min — ABNORMAL LOW (ref >60.00)
Glucose, Bld: 91 mg/dL (ref 70–99)
Potassium: 4.8 mEq/L (ref 3.5–5.1)
Sodium: 138 mEq/L (ref 135–145)

## 2022-01-21 LAB — URIC ACID: Uric Acid, Serum: 2.7 mg/dL (ref 2.4–7.0)

## 2022-01-21 LAB — HM DIABETES EYE EXAM

## 2022-01-21 MED ORDER — FLUTICASONE PROPIONATE 50 MCG/ACT NA SUSP: 2.0000 | Freq: Every day | NASAL | 1 refills | Status: DC

## 2022-01-21 MED ORDER — LOSARTAN POTASSIUM 50 MG PO TABS: 50.0000 mg | ORAL_TABLET | Freq: Every day | ORAL | 1 refills | Status: DC

## 2022-01-21 MED ORDER — LEVOCETIRIZINE DIHYDROCHLORIDE 5 MG PO TABS: 5.0000 mg | ORAL_TABLET | Freq: Every evening | ORAL | 1 refills | Status: DC

## 2022-01-21 NOTE — Progress Notes (Signed)
Subjective:  Patient ID: Sarah [name], female    DOB: [birth year]  Age: 81 y.o. MRN: [id number]  CC: Hypertension  This visit occurred during the SARS-CoV-2 public health emergency.  Safety protocols were in place, including screening questions prior to the visit, additional usage of staff PPE, and extensive cleaning of exam room while observing appropriate contact time as indicated for disinfecting solutions.    HPI MERLY HINKSON presents for f/up -  She is not certain that she is taking losartan.  She feels like her blood pressure is well controlled.  She denies headache, blurred vision, chest pain, shortness of breath, dizziness, lightheadedness, or edema.  Outpatient Medications Prior to Visit  Medication Sig Dispense Refill   acetaminophen (TYLENOL) 500 MG tablet Take 500 mg by mouth every 6 (six) hours as needed.     allopurinol (ZYLOPRIM) 100 MG tablet TAKE 2 TABLETS(200 MG) BY MOUTH DAILY 180 tablet 0   atorvastatin (LIPITOR) 10 MG tablet Take 1 tablet (10 mg total) by mouth daily. 90 tablet 1   brimonidine (ALPHAGAN) 0.2 % ophthalmic solution 1 drop 3 (three) times daily.     Bromfenac Sodium (PROLENSA) 0.07 % SOLN Place 1 drop into both eyes daily. 6 mL 3   diltiazem (TIAZAC) 300 MG 24 hr capsule TAKE 1 CAPSULE BY MOUTH DAILY BEFORE AND BREAKFAST 90 capsule 1   dorzolamide-timolol (COSOPT) 22.3-6.8 MG/ML ophthalmic solution Place 1 drop into both eyes in the morning, at noon, and at bedtime. 10 mL 3   hydrALAZINE (APRESOLINE) 50 MG tablet Take 1 tablet (50 mg total) by mouth 3 (three) times daily. 270 tablet 1   Loteprednol Etabonate (LOTEMAX SM) 0.38 % GEL Place one drop in both eyes every other day 5 g 5   Nebivolol HCl 20 MG TABS TAKE 1 TABLET(20 MG) BY MOUTH DAILY 90 tablet 0   Netarsudil-Latanoprost (ROCKLATAN) 0.02-0.005 % SOLN Apply to eye.     SYMBICORT 160-4.5 MCG/ACT inhaler INHALE 2 PUFFS INTO THE LUNGS TWICE DAILY 30.6 g 5   zolpidem (AMBIEN) 5 MG tablet TAKE 1  TABLET(5 MG) BY MOUTH AT BEDTIME AS NEEDED FOR SLEEP 90 tablet 1   losartan (COZAAR) 25 MG tablet Take 0.5 tablets (12.5 mg total) by mouth daily. 90 tablet 1   No facility-administered medications prior to visit.    ROS Review of Systems  Constitutional: Negative.  Negative for diaphoresis and fatigue.  HENT:  Positive for postnasal drip. Negative for nosebleeds, sinus pressure, sinus pain, sneezing and sore throat.   Eyes: Negative.   Respiratory:  Negative for cough, shortness of breath and wheezing.   Cardiovascular:  Negative for chest pain, palpitations and leg swelling.  Gastrointestinal:  Negative for abdominal pain, diarrhea and nausea.  Endocrine: Negative.   Genitourinary: Negative.  Negative for difficulty urinating.  Musculoskeletal: Negative.   Skin: Negative.   Neurological:  Negative for dizziness, weakness and light-headedness.  Hematological:  Negative for adenopathy. Does not bruise/bleed easily.  Psychiatric/Behavioral: Negative.     Objective:  BP (!) 158/60 (BP Location: Left Arm, Patient Position: Sitting, Cuff Size: Large)    Pulse 60    Temp 97.8 F (36.6 C) (Oral)    Resp 16    Ht 5\' 2"  (1.575 m)    Wt 149 lb (67.6 kg)    SpO2 99%    BMI 27.25 kg/m   BP Readings from Last 3 Encounters:  01/21/22 (!) 158/60  10/08/21 (!) 144/66  09/26/21 132/66  Wt Readings from Last 3 Encounters:  01/21/22 149 lb (67.6 kg)  10/08/21 148 lb (67.1 kg)  09/26/21 144 lb (65.3 kg)    Physical Exam Vitals reviewed.  HENT:     Nose: Nose normal.     Mouth/Throat:     Mouth: Mucous membranes are moist.  Eyes:     General: No scleral icterus.    Conjunctiva/sclera: Conjunctivae normal.  Cardiovascular:     Rate and Rhythm: Normal rate and regular rhythm.     Heart sounds: No murmur heard.   No gallop.  Pulmonary:     Effort: Pulmonary effort is normal.     Breath sounds: No stridor. No wheezing, rhonchi or rales.  Abdominal:     General: Abdomen is flat.      Palpations: There is no mass.     Tenderness: There is no abdominal tenderness. There is no guarding.     Hernia: No hernia is present.  Musculoskeletal:        General: Normal range of motion.     Cervical back: Neck supple.     Right lower leg: No edema.     Left lower leg: No edema.  Lymphadenopathy:     Cervical: No cervical adenopathy.  Skin:    General: Skin is warm.  Neurological:     General: No focal deficit present.     Mental Status: She is alert.    Lab Results  Component Value Date   WBC 5.6 02/18/2021   HGB 10.9 (A) 08/09/2021   HCT 33.9 (L) 02/18/2021   PLT 206.0 02/18/2021   GLUCOSE 91 01/21/2022   CHOL 144 02/18/2021   TRIG 53.0 02/18/2021   HDL 73.50 02/18/2021   LDLCALC 60 02/18/2021   ALT 13 02/18/2021   AST 15 02/18/2021   NA 138 01/21/2022   K 4.8 01/21/2022   CL 108 01/21/2022   CREATININE 1.63 (H) 01/21/2022   BUN 44 (H) 01/21/2022   CO2 21 01/21/2022   TSH 1.68 02/18/2021   HGBA1C 5.7 02/18/2021   MICROALBUR 0.8 09/21/2018    US RENAL  Result Date: 11/11/2018 CLINICAL DATA:  Stage III chronic kidney disease, hypertension, diabetes mellitus EXAM: RENAL / URINARY TRACT ULTRASOUND COMPLETE COMPARISON:  None FINDINGS: Right Kidney: Renal measurements: 7.8 x 4.0 x 4.0 cm = volume: 65.8 mL. Cortical thinning. Increased cortical echogenicity. No mass, hydronephrosis or shadowing calcification Left Kidney: Renal measurements: 8.9 x 4.5 x 4.5 cm = volume: 92.1 mL. Cortical thinning. Increased cortical echogenicity. Simple appearing cyst at LEFT kidney 4.4 x 4.1 x 4.5 cm. No additional mass or hydronephrosis. No shadowing calcification. Bladder: Appears normal for degree of bladder distention. 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 Simple appearing LEFT renal cyst 4.5 cm diameter. Electronically Signed   By: Lavonia Dana M.D.   On: 11/11/2018 09:28    Assessment & Plan:   Glenyce was seen today for  hypertension.  Diagnoses and all orders for this visit:  Essential hypertension- Her blood pressure is not adequately well controlled.  Will increase the dose of losartan. -     Basic metabolic panel; Future -     losartan (COZAAR) 50 MG tablet; Take 1 tablet (50 mg total) by mouth daily. -     Basic metabolic panel -     CBC with Differential/Platelet; Future  CRI (chronic renal insufficiency), stage 3 (moderate) -     Basic metabolic panel; Future -     losartan (  COZAAR) 50 MG tablet; Take 1 tablet (50 mg total) by mouth daily. -     Basic metabolic panel  Anemia, chronic disease -     CBC with Differential/Platelet; Future  Chronic gout due to renal impairment of multiple sites without tophus- She has achieved her uric acid goal. -     Uric acid; Future -     Uric acid  Seasonal allergic rhinitis due to pollen -     fluticasone (FLONASE) 50 MCG/ACT nasal spray; Place 2 sprays into both nostrils daily. -     levocetirizine (XYZAL) 5 MG tablet; Take 1 tablet (5 mg total) by mouth every evening.   I have discontinued [place] losartan. I am also having her start on losartan, fluticasone, and levocetirizine. Additionally, I am having her maintain her acetaminophen, brimonidine, Rocklatan, Symbicort, Lotemax SM, atorvastatin, hydrALAZINE, zolpidem, Nebivolol HCl, diltiazem, allopurinol, Prolensa, and dorzolamide-timolol.  Meds ordered this encounter  Medications   losartan (COZAAR) 50 MG tablet    Sig: Take 1 tablet (50 mg total) by mouth daily.    Dispense:  90 tablet    Refill:  1   fluticasone (FLONASE) 50 MCG/ACT nasal spray    Sig: Place 2 sprays into both nostrils daily.    Dispense:  48 g    Refill:  1   levocetirizine (XYZAL) 5 MG tablet    Sig: Take 1 tablet (5 mg total) by mouth every evening.    Dispense:  90 tablet    Refill:  1     Follow-up: Return in about 3 months (around 04/20/2022).  Scarlette Calico, MD

## 2022-01-21 NOTE — Patient Instructions (Signed)

## 2022-01-22 ENCOUNTER — Telehealth: Payer: Self-pay | Admitting: Internal Medicine

## 2022-01-22 NOTE — Telephone Encounter (Signed)
PT's daughter visits today with forms to be completed. Forms have been left in Dr.Jones' mailbox!  CB: [phone]

## 2022-01-23 NOTE — Telephone Encounter (Signed)
Form placed on PCPs desk for completion of unanswered question.

## 2022-01-27 NOTE — Telephone Encounter (Signed)
Pt has been informed that form is completed and waiting up front for pick up.

## 2022-01-28 ENCOUNTER — Ambulatory Visit (INDEPENDENT_AMBULATORY_CARE_PROVIDER_SITE_OTHER): Payer: Medicare Other

## 2022-01-28 DIAGNOSIS — Z0000 Encounter for general adult medical examination without abnormal findings: Secondary | ICD-10-CM

## 2022-01-28 NOTE — Progress Notes (Signed)
I connected with Sarah [name] today by telephone and verified that I am speaking with the correct person using two identifiers. Location patient: home Location provider: work Persons participating in the virtual visit: patient, provider.   I discussed the limitations, risks, security and privacy concerns of performing an evaluation and management service by telephone and the availability of in person appointments. I also discussed with the patient that there may be a patient responsible charge related to this service. The patient expressed understanding and verbally consented to this telephonic visit.    Interactive audio and video telecommunications were attempted between this provider and patient, however failed, due to patient having technical difficulties OR patient did not have access to video capability.  We continued and completed visit with audio only.  Some vital signs may be absent or patient reported.   Time Spent with patient on telephone encounter: 40 minutes  Subjective:   Sarah [name] is a 81 y.o. female who presents for Medicare Annual (Subsequent) preventive examination.  Review of Systems     Cardiac Risk Factors include: advanced age (>69men, >23 women);dyslipidemia;hypertension;diabetes mellitus     Objective:    Today's Vitals   01/28/22 1544  PainSc: 5    There is no height or weight on file to calculate BMI.  Advanced Directives 01/28/2022 01/21/2021 10/24/2019 06/30/2017 06/30/2017 01/07/2013  Does Patient Have a Medical Advance Directive? No Yes No - No Patient does not have advance directive  Does patient want to make changes to medical advance directive? - No - Patient declined - - - -  Would patient like information on creating a medical advance directive? No - Patient declined - No - Patient declined (No Data) Yes (ED - Information included in AVS) -  Pre-existing out of facility DNR order (yellow form or pink MOST form) - - - - - No    Current Medications  (verified) Outpatient Encounter Medications as of 01/28/2022  Medication Sig   acetaminophen (TYLENOL) 500 MG tablet Take 500 mg by mouth every 6 (six) hours as needed.   allopurinol (ZYLOPRIM) 100 MG tablet TAKE 2 TABLETS(200 MG) BY MOUTH DAILY   atorvastatin (LIPITOR) 10 MG tablet Take 1 tablet (10 mg total) by mouth daily.   brimonidine (ALPHAGAN) 0.2 % ophthalmic solution 1 drop 3 (three) times daily.   Bromfenac Sodium (PROLENSA) 0.07 % SOLN Place 1 drop into both eyes daily.   diltiazem (TIAZAC) 300 MG 24 hr capsule TAKE 1 CAPSULE BY MOUTH DAILY BEFORE AND BREAKFAST   dorzolamide-timolol (COSOPT) 22.3-6.8 MG/ML ophthalmic solution Place 1 drop into both eyes in the morning, at noon, and at bedtime.   fluticasone (FLONASE) 50 MCG/ACT nasal spray Place 2 sprays into both nostrils daily.   hydrALAZINE (APRESOLINE) 50 MG tablet Take 1 tablet (50 mg total) by mouth 3 (three) times daily.   levocetirizine (XYZAL) 5 MG tablet Take 1 tablet (5 mg total) by mouth every evening.   losartan (COZAAR) 50 MG tablet Take 1 tablet (50 mg total) by mouth daily.   Loteprednol Etabonate (LOTEMAX SM) 0.38 % GEL Place one drop in both eyes every other day   Nebivolol HCl 20 MG TABS TAKE 1 TABLET(20 MG) BY MOUTH DAILY   Netarsudil-Latanoprost (ROCKLATAN) 0.02-0.005 % SOLN Apply to eye.   SYMBICORT 160-4.5 MCG/ACT inhaler INHALE 2 PUFFS INTO THE LUNGS TWICE DAILY   zolpidem (AMBIEN) 5 MG tablet TAKE 1 TABLET(5 MG) BY MOUTH AT BEDTIME AS NEEDED FOR SLEEP   [DISCONTINUED] valsartan (DIOVAN) 160  MG tablet Take 1 tablet (160 mg total) by mouth daily.   No facility-administered encounter medications on file as of 01/28/2022.    Allergies (verified) Lisinopril   History: Past Medical History:  Diagnosis Date   Allergic rhinitis    Arthritis    Chronic back pain    Contact dermatitis and other eczema, due to unspecified cause    Diabetes mellitus without complication (HCC)    Dysrhythmia    hx of heart  beating fast    Glaucoma    POAG OU   History of sinusitis    Hx of adenomatous colonic polyps 10/17/2014   Hypertension    Hypertensive retinopathy    OU   Peripheral vascular disease (HCC)    hx of DVT - left leg    Personal history of unspecified circulatory disease    Plantar fascial fibromatosis    Shortness of breath    with exertion    Past Surgical History:  Procedure Laterality Date   ABDOMINAL HYSTERECTOMY     CATARACT EXTRACTION Bilateral 2018   EYE SURGERY Bilateral 2018   Cat Sx   FLEXIBLE SIGMOIDOSCOPY     FOOT SURGERY Bilateral    History of Lumbar fusion     '87   SHOULDER ARTHROSCOPY WITH ROTATOR CUFF REPAIR AND SUBACROMIAL DECOMPRESSION Left 01/14/2013   Procedure: Left Shoulder Arthroscopy with Debridement, Subacromial Decompression,rotator cuff repair;  Surgeon: Mcarthur Rossetti, MD;  Location: [facility];  Service: Orthopedics;  Laterality: Left;  Left Shoulder Arthroscopy with Debridement, Subacromial Decompression   Family History  Problem Relation Age of Onset   Leukemia Mother    Brain cancer Father        mets   Throat cancer Brother        x 2 brothers   Social History   Socioeconomic History   Marital status: Legally Separated    Spouse name: Not on file   Number of children: 2   Years of education: Not on file   Highest education level: Not on file  Occupational History   Occupation: retired  Tobacco Use   Smoking status: Never   Smokeless tobacco: Never  Vaping Use   Vaping Use: Never used  Substance and Sexual Activity   Alcohol use: No   Drug use: No   Sexual activity: Not Currently  Other Topics Concern   Not on file  Social History Narrative   HSG. Married- '63. 1 daughter '64, 1 son '70,  2 grandchildren. Work: retired. Lives with husband and brother is in the home.   Social Determinants of Health   Financial Resource Strain: Low Risk    Difficulty of Paying Living Expenses: Not hard at all  Food Insecurity: No Food  Insecurity   Worried About Charity fundraiser in the Last Year: Never true   [place] in the Last Year: Never true  Transportation Needs: No Transportation Needs   Lack of Transportation (Medical): No   Lack of Transportation (Non-Medical): No  Physical Activity: Inactive   Days of Exercise per Week: 0 days   Minutes of Exercise per Session: 0 min  Stress: No Stress Concern Present   Feeling of Stress : Not at all  Social Connections: Socially Integrated   Frequency of Communication with Friends and Family: More than three times a week   Frequency of Social Gatherings with Friends and Family: Once a week   Attends Religious Services: More than 4 times per year  Active Member of Clubs or Organizations: No   Attends Music therapist: More than 4 times per year   Marital Status: Married    Tobacco Counseling Counseling given: Not Answered   Clinical Intake:  Pre-visit preparation completed: Yes  Pain : 0-10 Pain Score: 5  Pain Type: Chronic pain Pain Location: Shoulder Pain Orientation: Left Pain Radiating Towards: no Pain Descriptors / Indicators: Aching, Discomfort Pain Onset: More than a month ago Pain Frequency: Intermittent Pain Relieving Factors: Tylenol or Advil  Pain Relieving Factors: Tylenol or Advil  Diabetes: No  How often do you need to have someone help you when you read instructions, pamphlets, or other written materials from your doctor or pharmacy?: 1 - Never What is the last grade level you completed in school?: 12th grade  Diabetic? yes  Interpreter Needed?: No  Information entered by :: Lisette Abu, LPN   Activities of Daily Living In your present state of health, do you have any difficulty performing the following activities: 01/28/2022  Hearing? N  Vision? Y  Difficulty concentrating or making decisions? N  Walking or climbing stairs? Y  Comment with gout  Dressing or bathing? N  Doing errands, shopping? N   Preparing Food and eating ? N  Using the Toilet? N  In the past six months, have you accidently leaked urine? N  Do you have problems with loss of bowel control? N  Managing your Medications? N  Managing your Finances? N  Housekeeping or managing your Housekeeping? N  Some recent data might be hidden    Patient Care Team: [name], MD as PCP - General (Internal Medicine) Regal, Tamala Fothergill, DPM as Consulting Physician (Podiatry) Claudia Desanctis, MD as Consulting Physician (Internal Medicine) Charlton Haws, [facility] as Pharmacist (Pharmacist) [name], MD as Consulting Physician (Ophthalmology)  Indicate any recent Medical Services you may have received from other than [facility] providers in the past year (date may be approximate).     Assessment:   This is a routine wellness examination for [place].  Hearing/Vision screen Hearing Screening - Comments:: Patient denied any hearing difficulty.   No hearing aids.  Vision Screening - Comments:: Patient wears corrective glasses/contacts.   Eye exam done annually by: Dr. [name]  Dietary issues and exercise activities discussed: Current Exercise Habits: The patient does not participate in regular exercise at present   Goals Addressed   None   Depression Screen PHQ 2/9 Scores 01/21/2022 01/21/2021 10/24/2019 09/22/2018 03/23/2018 06/30/2017 08/15/2014  PHQ - 2 Score 0 0 1 0 1 1 0  PHQ- 9 Score - - - - 2 3 -    Fall Risk Fall Risk  01/28/2022 01/21/2022 01/21/2021 10/24/2019 09/22/2018  Falls in the past year? 0 0 0 0 No  Number falls in past yr: 0 - 0 0 -  Injury with Fall? 0 - 0 0 -  Risk for fall due to : No Fall Risks - No Fall Risks - -  Follow up Falls evaluation completed - - Falls prevention discussed -    FALL RISK PREVENTION PERTAINING TO THE HOME:  Any stairs in or around the home? Yes  If so, are there any without handrails? No  Home free of loose throw rugs in walkways, pet beds, electrical cords, etc? Yes   Adequate lighting in your home to reduce risk of falls? Yes   ASSISTIVE DEVICES UTILIZED TO PREVENT FALLS:  Life alert? No  Use of a cane, walker or w/c?  Yes  (for gout) Grab bars in the bathroom? Yes  Shower chair or bench in shower? No  Elevated toilet seat or a handicapped toilet? No   TIMED UP AND GO:  Was the test performed? No .  Length of time to ambulate 10 feet: n/a sec.   Gait steady and fast with assistive device  Cognitive Function: Normal cognitive status assessed by direct observation by this Nurse Health Advisor. No abnormalities found.   MMSE - Mini Mental State Exam 06/30/2017  Orientation to time 5  Orientation to Place 5  Registration 3  Attention/ Calculation 4  Recall 2  Language- name 2 objects 2  Language- repeat 1  Language- follow 3 step command 3  Language- read & follow direction 1  Write a sentence 1  Copy design 1  Total score 28        Immunizations Immunization History  Administered Date(s) Administered   Fluad Quad(high Dose 65+) 08/01/2019   Influenza Split 09/08/2011   Influenza, High Dose Seasonal PF 10/15/2017, 09/21/2018   Influenza, Seasonal, Injecte, Preservative Fre 12/08/2012   Influenza,inj,Quad PF,6+ Mos 08/09/2013, 08/14/2014, 01/24/2016, 07/22/2016   Influenza-Unspecified 11/11/2020, 08/31/2021   Moderna Sars-Covid-2 Vaccination 01/13/2020, 02/10/2020, 12/02/2020   Pneumococcal Conjugate-13 12/18/2014   Pneumococcal Polysaccharide-23 08/09/2013, 10/10/2019   Tdap 05/21/2017   Tetanus 02/09/2014   Zoster, Live 02/09/2014    TDAP status: Up to date  Flu Vaccine status: Up to date  Pneumococcal vaccine status: Up to date  Covid-19 vaccine status: Completed vaccines  Qualifies for Shingles Vaccine? Yes   Zostavax completed Yes   Shingrix Completed?: No.    Education has been provided regarding the importance of this vaccine. Patient has been advised to call insurance company to determine out of pocket expense if  they have not yet received this vaccine. Advised may also receive vaccine at local pharmacy or Health Dept. Verbalized acceptance and understanding.  Screening Tests Health Maintenance  Topic Date Due   Hepatitis C Screening  Never done   Zoster Vaccines- Shingrix (1 of 2) Never done   FOOT EXAM  09/23/2019   COVID-19 Vaccine (4 - Booster for Moderna series) 01/27/2021   HEMOGLOBIN A1C  08/21/2021   OPHTHALMOLOGY EXAM  11/05/2021   TETANUS/TDAP  05/22/2027   Pneumonia Vaccine 25+ Years old  Completed   INFLUENZA VACCINE  Completed   DEXA SCAN  Completed   HPV VACCINES  Aged Out   COLONOSCOPY (Pts 45-63yrs Insurance coverage will need to be confirmed)  Discontinued    Health Maintenance  Health Maintenance Due  Topic Date Due   Hepatitis C Screening  Never done   Zoster Vaccines- Shingrix (1 of 2) Never done   FOOT EXAM  09/23/2019   COVID-19 Vaccine (4 - Booster for Moderna series) 01/27/2021   HEMOGLOBIN A1C  08/21/2021   OPHTHALMOLOGY EXAM  11/05/2021    Colorectal cancer screening: No longer required.   Mammogram status: Completed 01/03/2020. Repeat every year  Bone Density status: Completed 11/14/2019. Results reflect: Bone density results: OSTEOPENIA. Repeat every 3 years.  Lung Cancer Screening: (Low Dose CT Chest recommended if Age 32-80 years, 30 pack-year currently smoking OR have quit w/in 15years.) does not qualify.   Lung Cancer Screening Referral: no  Additional Screening:  Hepatitis C Screening: does qualify; Completed no  Vision Screening: Recommended annual ophthalmology exams for early detection of glaucoma and other disorders of the eye. Is the patient up to date with their annual eye exam?  Yes  Who  is the provider or what is the name of the office in which the patient attends annual eye exams? [name], MD. If pt is not established with a provider, would they like to be referred to a provider to establish care? No .   Dental Screening:  Recommended annual dental exams for proper oral hygiene  Community Resource Referral / Chronic Care Management: CRR required this visit?  No   CCM required this visit?  No      Plan:     I have personally reviewed and noted the following in the patients chart:   Medical and social history Use of alcohol, tobacco or illicit drugs  Current medications and supplements including opioid prescriptions.  Functional ability and status Nutritional status Physical activity Advanced directives List of other physicians Hospitalizations, surgeries, and ER visits in previous 12 months Vitals Screenings to include cognitive, depression, and falls Referrals and appointments  In addition, I have reviewed and discussed with patient certain preventive protocols, quality metrics, and best practice recommendations. A written personalized care plan for preventive services as well as general preventive health recommendations were provided to patient.     Sheral Flow, LPN   [id number]   Nurse Notes:  Patient is cogitatively intact. There were no vitals filed for this visit. There is no height or weight on file to calculate BMI.

## 2022-01-28 NOTE — Telephone Encounter (Signed)
PT visits today and I had showed her the completed forms. She had left them again for [place] and stated she would like the medical facts (section 2) to be more specific regarding her mom's ailments. She referenced chronic back pain and that her mom is disabled.  CB: [phone]

## 2022-01-28 NOTE — Patient Instructions (Signed)
Sarah [name] , Thank you for taking time to come for your Medicare Wellness Visit. I appreciate your ongoing commitment to your health goals. Please review the following plan we discussed and let me know if I can assist you in the future.   Screening recommendations/referrals: Colonoscopy: Not a candidate for screening due to age 80: 01/03/2020; due every 2 years Bone Density: 11/14/2019; due every 3 years Recommended yearly ophthalmology/optometry visit for glaucoma screening and checkup Recommended yearly dental visit for hygiene and checkup  Vaccinations: Influenza vaccine: 08/31/2021 Pneumococcal vaccine:  12/18/2014, 10/10/2019 Tdap vaccine: 05/21/2017; due every 10 years Shingles vaccine: never done; can receive vaccine at local pharmacy ([facility], [facility], etc.   Covid-19: 01/13/2020, 02/10/2020, 12/02/2020  Advanced directives: Advance directive discussed with you today. Even though you declined this today please call our office should you change your mind and we can give you the proper paperwork for you to fill out.  Conditions/risks identified: Yes; Client understands the importance of follow-up appointments with providers by attending scheduled visits and discussed goals to eat healthier, increase physical activity 5 times a week for 30 minutes each, exercise the brain by doing stimulating brain exercises (reading, adult coloring, crafting, listening to music, puzzles, etc.), socialize and enjoy life more, get enough sleep at least 8-9 hours average per night and make time for laughter.  Next appointment: 01/29/2023 at 3:40 p.m. telephone visit with Mignon Pine, Nurse Health Advisor.  If need to reschedule or cancel please call [phone].   Preventive Care 17 Years and Older, Female Preventive care refers to lifestyle choices and visits with your health care provider that can promote health and wellness. What does preventive care include? A yearly physical exam. This is also called an  annual well check. Dental exams once or twice a year. Routine eye exams. Ask your health care provider how often you should have your eyes checked. Personal lifestyle choices, including: Daily care of your teeth and gums. Regular physical activity. Eating a healthy diet. Avoiding tobacco and drug use. Limiting alcohol use. Practicing safe sex. Taking low-dose aspirin every day. Taking vitamin and mineral supplements as recommended by your health care provider. What happens during an annual well check? The services and screenings done by your health care provider during your annual well check will depend on your age, overall health, lifestyle risk factors, and family history of disease. Counseling  Your health care provider may ask you questions about your: Alcohol use. Tobacco use. Drug use. Emotional well-being. Home and relationship well-being. Sexual activity. Eating habits. History of falls. Memory and ability to understand (cognition). Work and work Statistician. Reproductive health. Screening  You may have the following tests or measurements: Height, weight, and BMI. Blood pressure. Lipid and cholesterol levels. These may be checked every 5 years, or more frequently if you are over 78 years old. Skin check. Lung cancer screening. You may have this screening every year starting at age 70 if you have a 30-pack-year history of smoking and currently smoke or have quit within the past 15 years. Fecal occult blood test (FOBT) of the stool. You may have this test every year starting at age 18. Flexible sigmoidoscopy or colonoscopy. You may have a sigmoidoscopy every 5 years or a colonoscopy every 10 years starting at age 40. Hepatitis C blood test. Hepatitis B blood test. Sexually transmitted disease (STD) testing. Diabetes screening. This is done by checking your blood sugar (glucose) after you have not eaten for a while (fasting). You may have this done  every 1-3 years. Bone  density scan. This is done to screen for osteoporosis. You may have this done starting at age 43. Mammogram. This may be done every 1-2 years. Talk to your health care provider about how often you should have regular mammograms. Talk with your health care provider about your test results, treatment options, and if necessary, the need for more tests. Vaccines  Your health care provider may recommend certain vaccines, such as: Influenza vaccine. This is recommended every year. Tetanus, diphtheria, and acellular pertussis (Tdap, Td) vaccine. You may need a Td booster every 10 years. Zoster vaccine. You may need this after age 77. Pneumococcal 13-valent conjugate (PCV13) vaccine. One dose is recommended after age 47. Pneumococcal polysaccharide (PPSV23) vaccine. One dose is recommended after age 34. Talk to your health care provider about which screenings and vaccines you need and how often you need them. This information is not intended to replace advice given to you by your health care provider. Make sure you discuss any questions you have with your health care provider. Document Released: 12/14/2015 Document Revised: 08/06/2016 Document Reviewed: 09/18/2015 Elsevier Interactive Patient Education  2017 [employer] Prevention in the Home Falls can cause injuries. They can happen to people of all ages. There are many things you can do to make your home safe and to help prevent falls. What can I do on the outside of my home? Regularly fix the edges of walkways and driveways and fix any cracks. Remove anything that might make you trip as you walk through a door, such as a raised step or threshold. Trim any bushes or trees on the path to your home. Use bright outdoor lighting. Clear any walking paths of anything that might make someone trip, such as rocks or tools. Regularly check to see if handrails are loose or broken. Make sure that both sides of any steps have handrails. Any raised  decks and porches should have guardrails on the edges. Have any leaves, snow, or ice cleared regularly. Use sand or salt on walking paths during winter. Clean up any spills in your garage right away. This includes oil or grease spills. What can I do in the bathroom? Use night lights. Install grab bars by the toilet and in the tub and shower. Do not use towel bars as grab bars. Use non-skid mats or decals in the tub or shower. If you need to sit down in the shower, use a plastic, non-slip stool. Keep the floor dry. Clean up any water that spills on the floor as soon as it happens. Remove soap buildup in the tub or shower regularly. Attach bath mats securely with double-sided non-slip rug tape. Do not have throw rugs and other things on the floor that can make you trip. What can I do in the bedroom? Use night lights. Make sure that you have a light by your bed that is easy to reach. Do not use any sheets or blankets that are too big for your bed. They should not hang down onto the floor. Have a firm chair that has side arms. You can use this for support while you get dressed. Do not have throw rugs and other things on the floor that can make you trip. What can I do in the kitchen? Clean up any spills right away. Avoid walking on wet floors. Keep items that you use a lot in easy-to-reach places. If you need to reach something above you, use a strong step stool that has  a grab bar. Keep electrical cords out of the way. Do not use floor polish or wax that makes floors slippery. If you must use wax, use non-skid floor wax. Do not have throw rugs and other things on the floor that can make you trip. What can I do with my stairs? Do not leave any items on the stairs. Make sure that there are handrails on both sides of the stairs and use them. Fix handrails that are broken or loose. Make sure that handrails are as long as the stairways. Check any carpeting to make sure that it is firmly attached  to the stairs. Fix any carpet that is loose or worn. Avoid having throw rugs at the top or bottom of the stairs. If you do have throw rugs, attach them to the floor with carpet tape. Make sure that you have a light switch at the top of the stairs and the bottom of the stairs. If you do not have them, ask someone to add them for you. What else can I do to help prevent falls? Wear shoes that: Do not have high heels. Have rubber bottoms. Are comfortable and fit you well. Are closed at the toe. Do not wear sandals. If you use a stepladder: Make sure that it is fully opened. Do not climb a closed stepladder. Make sure that both sides of the stepladder are locked into place. Ask someone to hold it for you, if possible. Clearly mark and make sure that you can see: Any grab bars or handrails. First and last steps. Where the edge of each step is. Use tools that help you move around (mobility aids) if they are needed. These include: Canes. Walkers. Scooters. Crutches. Turn on the lights when you go into a dark area. Replace any light bulbs as soon as they burn out. Set up your furniture so you have a clear path. Avoid moving your furniture around. If any of your floors are uneven, fix them. If there are any pets around you, be aware of where they are. Review your medicines with your doctor. Some medicines can make you feel dizzy. This can increase your chance of falling. Ask your doctor what other things that you can do to help prevent falls. This information is not intended to replace advice given to you by your health care provider. Make sure you discuss any questions you have with your health care provider. Document Released: 09/13/2009 Document Revised: 04/24/2016 Document Reviewed: 12/22/2014 Elsevier Interactive Patient Education  2017 [employer].

## 2022-02-03 DIAGNOSIS — Z01419 Encounter for gynecological examination (general) (routine) without abnormal findings: Secondary | ICD-10-CM | POA: Diagnosis not present

## 2022-02-03 DIAGNOSIS — Z124 Encounter for screening for malignant neoplasm of cervix: Secondary | ICD-10-CM | POA: Diagnosis not present

## 2022-02-03 DIAGNOSIS — Z6826 Body mass index (BMI) 26.0-26.9, adult: Secondary | ICD-10-CM | POA: Diagnosis not present

## 2022-02-03 DIAGNOSIS — Z1231 Encounter for screening mammogram for malignant neoplasm of breast: Secondary | ICD-10-CM | POA: Diagnosis not present

## 2022-02-14 DIAGNOSIS — H59033 Cystoid macular edema following cataract surgery, bilateral: Secondary | ICD-10-CM | POA: Diagnosis not present

## 2022-02-14 DIAGNOSIS — H401131 Primary open-angle glaucoma, bilateral, mild stage: Secondary | ICD-10-CM | POA: Diagnosis not present

## 2022-02-27 DIAGNOSIS — N1832 Chronic kidney disease, stage 3b: Secondary | ICD-10-CM | POA: Diagnosis not present

## 2022-02-27 DIAGNOSIS — H401131 Primary open-angle glaucoma, bilateral, mild stage: Secondary | ICD-10-CM | POA: Diagnosis not present

## 2022-02-27 DIAGNOSIS — H59033 Cystoid macular edema following cataract surgery, bilateral: Secondary | ICD-10-CM | POA: Diagnosis not present

## 2022-03-10 ENCOUNTER — Other Ambulatory Visit: Payer: Self-pay | Admitting: Internal Medicine

## 2022-03-10 DIAGNOSIS — I10 Essential (primary) hypertension: Secondary | ICD-10-CM

## 2022-03-12 DIAGNOSIS — E1122 Type 2 diabetes mellitus with diabetic chronic kidney disease: Secondary | ICD-10-CM | POA: Diagnosis not present

## 2022-03-12 DIAGNOSIS — D649 Anemia, unspecified: Secondary | ICD-10-CM | POA: Diagnosis not present

## 2022-03-12 DIAGNOSIS — I129 Hypertensive chronic kidney disease with stage 1 through stage 4 chronic kidney disease, or unspecified chronic kidney disease: Secondary | ICD-10-CM | POA: Diagnosis not present

## 2022-03-12 DIAGNOSIS — N1832 Chronic kidney disease, stage 3b: Secondary | ICD-10-CM | POA: Diagnosis not present

## 2022-03-14 ENCOUNTER — Ambulatory Visit (INDEPENDENT_AMBULATORY_CARE_PROVIDER_SITE_OTHER): Payer: Medicare Other

## 2022-03-14 DIAGNOSIS — E785 Hyperlipidemia, unspecified: Secondary | ICD-10-CM

## 2022-03-14 DIAGNOSIS — I10 Essential (primary) hypertension: Secondary | ICD-10-CM

## 2022-03-14 DIAGNOSIS — M1A39X0 Chronic gout due to renal impairment, multiple sites, without tophus (tophi): Secondary | ICD-10-CM

## 2022-03-14 DIAGNOSIS — J4540 Moderate persistent asthma, uncomplicated: Secondary | ICD-10-CM

## 2022-03-14 DIAGNOSIS — N183 Chronic kidney disease, stage 3 unspecified: Secondary | ICD-10-CM

## 2022-03-14 NOTE — Patient Instructions (Signed)
Visit Information ? ?Following are the goals we discussed today:  ? ?Manage My Medicine  ? ?Timeframe:  Long-Range Goal ?Priority:  Medium ?Start Date:    04/26/21                         ?Expected End Date:    03/15/2023                ? ?Follow Up Date 05/2022 ?  ?- call for medicine refill 2 or 3 days before it runs out ?- call if I am sick and can't take my medicine ?- keep a list of all the medicines I take; vitamins and herbals too  ?  ?Why is this important?   ?These steps will help you keep on track with your medicines. ? ?Plan: Telephone follow up appointment with care management team member scheduled for:  3 months ?The patient has been provided with contact information for the care management team and has been advised to call with any health related questions or concerns.  ? ?Tomasa Blase, PharmD ?Clinical Pharmacist, [place]  ? ?Please call the care guide team at [phone] if you need to cancel or reschedule your appointment.  ? ?The patient verbalized understanding of instructions, educational materials, and care plan provided today and declined offer to receive copy of patient instructions, educational materials, and care plan.  ? ?

## 2022-03-14 NOTE — Progress Notes (Signed)
? ?Chronic Care Management ?Pharmacy Note ? ?03/14/2022 ?Name:  Sarah [name] MRN:  [id number] DOB:  1941/01/31 ? ?Summary: ?-Patient notes that losartan was increased to 169m on 03/12/2022 by Dr. FRoyce Macadamia(Nephrology) due to elevated Bps in office - patient unsure of BP in office, notes her current BP cuff is inaccurate  ?-Denies any issues since increasing losartan dosage ?-Reports no gout attacks since most recent increase in allopurinol dosing  ?-Patient reports that allergies typically well controlled on xyzal - unable to use flonase due to elevations in occular pressure with use ?-Uses symbicort as needed - denies any issues with breathing  ? ?Recommendations/Changes made from today's visit: ?-Patient plans to purchase new BP cuff - is going to follow up with Dr. FRoyce Macadamianext week to discuss BP readings - agreeable to start checking BP daily  ?-Recommended for patient to receive shingrix vaccines with next visit to pharmacy ? ? ?Subjective: ?Sarah RICCIARDIis an 81y.o. year old female who is a primary patient of JJanith Lima MD.  The CCM team was consulted for assistance with disease management and care coordination needs.   ? ?Engaged with patient by telephone for follow up visit in response to provider referral for pharmacy case management and/or care coordination services.  ? ?Consent to Services:  ?The patient was given information about Chronic Care Management services, agreed to services, and gave verbal consent prior to initiation of services.  Please see initial visit note for detailed documentation.  ? ?Patient Care Team: ?JJanith Lima MD as PCP - General (Internal Medicine) ?RWallene Huh DPM as Consulting Physician (Podiatry) ?FClaudia Desanctis MD as Consulting Physician (Internal Medicine) ?ZBernarda Caffey MD as Consulting Physician (Ophthalmology) ?[facility](Ph[facility]ist) ? ?Recent office visits: ?01/21/2022 - Dr. JRonnald Ramp- losartan 578mdaily, levocetirizine, flonase rx'd  ?10/08/2021 -  Dr. JoRonnald Ramp allopurinol increased to 20044maily - oxycodone for gout (NSAID Ci'd) ? ?Recent consult visits: ?01/17/2022 - Dr. ZamCoralyn PearOphthalmology - no changes - f/u in 6 months  ?10/08/2021 - Dr. BenTempie DonningOrthopedics  - left wrist pain - no changes to medication  ?09/26/2021 - Dr. BenRobina AdeOrthopedics - left wrist pain - medrol dose pak rx'd  ?09/16/2021 - Dr. ZamCoralyn PearOphthalmology  - bromfenac - 1 drop each eye daily, and loteprednol etabonate - 1 drop each eye every other day (reduced doses) ? ?Hospital visits: ?None in previous 6 months ? ?Objective: ? ?Lab Results  ?Component Value Date  ? CREATININE 1.63 (H) 01/21/2022  ? BUN 44 (H) 01/21/2022  ? GFR 29.73 (L) 01/21/2022  ? GFRNONAA 54 (L) 01/07/2013  ? GFRAA 35 08/09/2021  ? NA 138 01/21/2022  ? K 4.8 01/21/2022  ? CALCIUM 10.0 01/21/2022  ? CO2 21 01/21/2022  ? GLUCOSE 91 01/21/2022  ? ? ?Lab Results  ?Component Value Date/Time  ? HGBA1C 5.7 02/18/2021 02:23 PM  ? HGBA1C 5.8 04/24/2020 02:20 PM  ? GFR 29.73 (L) 01/21/2022 04:06 PM  ? GFR 30.83 (L) 02/18/2021 02:23 PM  ? MICROALBUR 0.8 09/21/2018 03:14 PM  ? MICROALBUR 2.0 (H) 05/21/2017 08:52 AM  ?  ?Last diabetic Eye exam:  ?Lab Results  ?Component Value Date/Time  ? HMDIABEYEEXA No Retinopathy 01/21/2022 12:00 AM  ?  ?Last diabetic Foot exam: No results found for: HMDIABFOOTEX  ? ?Lab Results  ?Component Value Date  ? CHOL 144 02/18/2021  ? HDL 73.50 02/18/2021  ? LDLEvaro 02/18/2021  ? TRIG 53.0 02/18/2021  ?  CHOLHDL 2 02/18/2021  ? ? ? ?  Latest Ref Rng & Units 02/18/2021  ?  2:23 PM 10/10/2019  ?  2:43 PM 09/21/2018  ?  3:14 PM  ?Hepatic Function  ?Total Protein 6.0 - 8.3 g/dL 7.8   7.6   7.9    ?Albumin 3.5 - 5.2 g/dL 4.6   4.5   4.6    ?AST 0 - 37 U/L _0 ?ALT 0 - 35 U/L _1 ?Alk Phosphatase 39 - 117 U/L 81   78   84    ?Total Bilirubin 0.2 - 1.2 mg/dL 0.4   0.4   0.3    ?Bilirubin, Direct 0.0 - 0.3 mg/dL 0.0   0.0     ? ? ?Lab Results  ?Component Value Date/Time  ? TSH  1.68 02/18/2021 02:23 PM  ? TSH 2.55 10/10/2019 02:43 PM  ? ? ? ?  Latest Ref Rng & Units 08/09/2021  ? 12:00 AM 02/18/2021  ?  2:23 PM 04/24/2020  ?  2:20 PM  ?CBC  ?WBC 4.0 - 10.5 K/uL  5.6   5.6    ?Hemoglobin 12.0 - 16.0 10.9      11.2   11.3    ?Hematocrit 36.0 - 46.0 %  33.9   33.8    ?Platelets 150.0 - 400.0 K/uL  206.0   238.0    ?  ? This result is from an external source.  ? ? ?Lab Results  ?Component Value Date/Time  ? VD25OH 50.53 02/18/2021 02:23 PM  ? VD25OH 89.19 10/10/2019 02:43 PM  ? ? ?Clinical ASCVD: No  ?The ASCVD Risk score (Arnett DK, et al., 2019) failed to calculate for the following reasons: ?  The 2019 ASCVD risk score is only valid for ages 46 to 37   ? ? ?  01/21/2022  ?  3:10 PM 01/21/2021  ?  2:26 PM 10/24/2019  ?  1:10 PM  ?Depression screen PHQ 2/9  ?Decreased Interest 0 0 0  ?Down, Depressed, Hopeless 0 0 1  ?PHQ - 2 Score 0 0 1  ?  ? ?Social History  ? ?Tobacco Use  ?Smoking Status Never  ?Smokeless Tobacco Never  ? ?BP Readings from Last 3 Encounters:  ?01/21/22 (!) 158/60  ?10/08/21 (!) 144/66  ?09/26/21 132/66  ? ?Pulse Readings from Last 3 Encounters:  ?01/21/22 60  ?10/08/21 65  ?09/26/21 63  ? ?Wt Readings from Last 3 Encounters:  ?01/21/22 149 lb (67.6 kg)  ?10/08/21 148 lb (67.1 kg)  ?09/26/21 144 lb (65.3 kg)  ? ?BMI Readings from Last 3 Encounters:  ?01/21/22 27.25 kg/m?  ?10/08/21 27.07 kg/m?  ?09/26/21 26.34 kg/m?  ? ? ?Assessment/Interventions: Review of patient past medical history, allergies, medications, health status, including review of consultants reports, laboratory and other test data, was performed as part of comprehensive evaluation and provision of chronic care management services.  ? ?SDOH:  (Social Determinants of Health) assessments and interventions performed: Yes ? ?SDOH Screenings  ? ?Alcohol Screen: Low Risk   ? Last Alcohol Screening Score (AUDIT): 0  ?Depression (PHQ2-9): Low Risk   ? PHQ-2 Score: 0  ?Financial Resource Strain: Low Risk   ? Difficulty of  Paying Living Expenses: Not hard at all  ?Food Insecurity: No Food Insecurity  ? Worried About Charity fundraiser in the Last Year: Never true  ? Ran Out of Food in  the Last Year: Never true  ?Housing: Low Risk   ? Last Housing Risk Score: 0  ?Physical Activity: Inactive  ? Days of Exercise per Week: 0 days  ? Minutes of Exercise per Session: 0 min  ?Social Connections: Socially Integrated  ? Frequency of Communication with Friends and Family: More than three times a week  ? Frequency of Social Gatherings with Friends and Family: Once a week  ? Attends Religious Services: More than 4 times per year  ? Active Member of Clubs or Organizations: No  ? Attends Archivist Meetings: More than 4 times per year  ? Marital Status: Married  ?Stress: No Stress Concern Present  ? Feeling of Stress : Not at all  ?Tobacco Use: Low Risk   ? Smoking Tobacco Use: Never  ? Smokeless Tobacco Use: Never  ? Passive Exposure: Not on file  ?Transportation Needs: No Transportation Needs  ? Lack of Transportation (Medical): No  ? Lack of Transportation (Non-Medical): No  ? ? ?CCM Care Plan ? ?Allergies  ?Allergen Reactions  ? Lisinopril   ?  REACTION: Lightheaded "sick"  ? ? ?Medications Reviewed Today   ? ? Reviewed by Tomasa Blase, [facility] (Pharmacist) on 03/14/22 at 1431  Med List Status: <None>  ? ?Medication Order Taking? Sig Documenting Provider Last Dose Status Informant  ?acetaminophen (TYLENOL) 500 MG tablet [id number] Yes Take 500 mg by mouth every 6 (six) hours as needed. [provider] Taking Active   ?allopurinol (ZYLOPRIM) 100 MG tablet [id number] Yes TAKE 2 TABLETS(200 MG) BY MOUTH DAILY [name], MD Taking Active   ?atorvastatin (LIPITOR) 10 MG tablet [id number] Yes Take 1 tablet (10 mg total) by mouth daily. [name], MD Taking Active   ?brimonidine [facility]) 0.2 % ophthalmic solution [id number] Yes 1 drop 3 (three) times daily. [provider] Taking Active   ?diltiazem (TIAZAC) 300  MG 24 hr capsule [id number] Yes TAKE 1 CAPSULE BY MOUTH DAILY BEFORE AND BREAKFAST  ?Patient taking differently: Take 300 mg by mouth daily. TAKE 1 CAPSULE BY MOUTH DAILY BEFORE BREAKFAST  ? Riki Rusk

## 2022-03-24 ENCOUNTER — Other Ambulatory Visit: Payer: Self-pay | Admitting: Internal Medicine

## 2022-03-24 DIAGNOSIS — I10 Essential (primary) hypertension: Secondary | ICD-10-CM

## 2022-03-26 DIAGNOSIS — N1832 Chronic kidney disease, stage 3b: Secondary | ICD-10-CM | POA: Diagnosis not present

## 2022-03-30 DIAGNOSIS — J4540 Moderate persistent asthma, uncomplicated: Secondary | ICD-10-CM | POA: Diagnosis not present

## 2022-03-30 DIAGNOSIS — N183 Chronic kidney disease, stage 3 unspecified: Secondary | ICD-10-CM | POA: Diagnosis not present

## 2022-03-30 DIAGNOSIS — E785 Hyperlipidemia, unspecified: Secondary | ICD-10-CM

## 2022-03-30 DIAGNOSIS — I129 Hypertensive chronic kidney disease with stage 1 through stage 4 chronic kidney disease, or unspecified chronic kidney disease: Secondary | ICD-10-CM

## 2022-04-03 ENCOUNTER — Other Ambulatory Visit: Payer: Self-pay | Admitting: Internal Medicine

## 2022-04-03 DIAGNOSIS — E790 Hyperuricemia without signs of inflammatory arthritis and tophaceous disease: Secondary | ICD-10-CM

## 2022-04-03 DIAGNOSIS — M1A39X0 Chronic gout due to renal impairment, multiple sites, without tophus (tophi): Secondary | ICD-10-CM

## 2022-04-04 DIAGNOSIS — E875 Hyperkalemia: Secondary | ICD-10-CM | POA: Diagnosis not present

## 2022-04-06 ENCOUNTER — Other Ambulatory Visit: Payer: Self-pay | Admitting: Internal Medicine

## 2022-04-06 DIAGNOSIS — F409 Phobic anxiety disorder, unspecified: Secondary | ICD-10-CM

## 2022-05-01 ENCOUNTER — Other Ambulatory Visit (INDEPENDENT_AMBULATORY_CARE_PROVIDER_SITE_OTHER): Payer: Self-pay | Admitting: Ophthalmology

## 2022-06-12 ENCOUNTER — Telehealth: Payer: Medicare Other

## 2022-06-18 ENCOUNTER — Other Ambulatory Visit: Payer: Self-pay | Admitting: Internal Medicine

## 2022-06-18 DIAGNOSIS — E785 Hyperlipidemia, unspecified: Secondary | ICD-10-CM

## 2022-06-23 DIAGNOSIS — H524 Presbyopia: Secondary | ICD-10-CM | POA: Diagnosis not present

## 2022-06-23 DIAGNOSIS — H401131 Primary open-angle glaucoma, bilateral, mild stage: Secondary | ICD-10-CM | POA: Diagnosis not present

## 2022-06-30 ENCOUNTER — Other Ambulatory Visit: Payer: Self-pay | Admitting: Internal Medicine

## 2022-06-30 DIAGNOSIS — E790 Hyperuricemia without signs of inflammatory arthritis and tophaceous disease: Secondary | ICD-10-CM

## 2022-06-30 DIAGNOSIS — M1A39X0 Chronic gout due to renal impairment, multiple sites, without tophus (tophi): Secondary | ICD-10-CM

## 2022-07-03 ENCOUNTER — Ambulatory Visit: Payer: Medicare Other | Admitting: Licensed Clinical Social Worker

## 2022-07-03 NOTE — Patient Instructions (Signed)
Visit Information  Thank you for taking time to visit with me today. Please don't hesitate to contact me if I can be of assistance to you.   Following are the goals we discussed today:   Goals Addressed             This Visit's Progress    COMPLETED: Concerns with Gout and Kidney       Care Coordination Interventions: Solution-Focused Strategies employed:  Active listening / Reflection utilized  Made referral to RN Care Coordinator Discussed other team members  ( would like to talk to RN before deciding on support from pharmacy        Your next appointment is by telephone on Aug. 8th with the RN  Please call the care guide team at [phone] if you need to cancel or reschedule your appointment.   If you are experiencing a Mental Health or [place] or need someone to talk to, please call 1-800-273-TALK (toll free, 24 hour hotline)   The patient verbalized understanding of instructions, educational materials, and care plan provided today and DECLINED offer to receive copy of patient instructions, educational materials, and care plan.   No further follow up required: for Social Work  Casimer Lanius, [place] [phone]

## 2022-07-03 NOTE — Patient Outreach (Signed)
  Care Coordination   Initial Visit Note   07/03/2022 Name: UNITA DETAMORE MRN: [id number] DOB: 1941/06/15  ABEGAIL KLOEPPEL is a 81 y.o. year old female who sees [name], MD for primary care. I spoke with  [name] by phone today  What matters to the patients health and wellness today?  Managing her gout and concerns with her kidney    Goals Addressed             This Visit's Progress    COMPLETED: Concerns with Gout and Kidney       Care Coordination Interventions: Solution-Focused Strategies employed:  Active listening / Reflection utilized  Made referral to RN Care Coordinator Discussed other team members  ( would like to talk to RN before deciding on support from pharmacy        SDOH assessments and interventions completed:  Yes  SDOH Interventions Today    Flowsheet Row Most Recent Value  SDOH Interventions   SDOH Interventions for the Following Domains Stress  Physical Activity Interventions Intervention Not Indicated  Stress Interventions Intervention Not Indicated  Transportation Interventions Intervention Not Indicated       Care Coordination Interventions Activated:  Yes  Care Coordination Interventions:  Yes, provided   Follow up plan: Referral made to RN care coordinator Follow up call scheduled for Aug. 8th    Encounter Outcome:  Pt. Visit Completed   Casimer Lanius, Regino Ramirez [phone]

## 2022-07-08 ENCOUNTER — Ambulatory Visit: Payer: Self-pay

## 2022-07-08 NOTE — Patient Instructions (Signed)
Visit Information  Thank you for taking time to visit with me today. Please don't hesitate to contact me if I can be of assistance to you.   Following are the goals we discussed today:   Goals Addressed             This Visit's Progress    Maintain my health       Care Coordination Interventions: Advised patient to contact provider that prescribed sodium bicarb to discuss concerns Encouraged to contact primary care provider to schedule annual wellness exam Medications reviewed and encouraged to continue to take as prescribed Encouraged patient to find out her target blood pressure range from provider Discussed elevate legs/feet when sitting or as much as possible when not active          Our next appointment is by telephone on 08/05/22 at 2:00pm  Please call the care guide team at [phone] if you need to cancel or reschedule your appointment.   If you are experiencing a Mental Health or [place] or need someone to talk to, please call the Suicide and Crisis Lifeline: 988  Patient verbalizes understanding of instructions and care plan provided today and agrees to view in [place]. Active MyChart status and patient understanding of how to access instructions and care plan via MyChart confirmed with patient.     The patient has been provided with contact information for the care management team and has been advised to call with any health related questions or concerns.   Thea Silversmith, RN, MSN, BSN, CCM Care Coordinator [phone]

## 2022-07-08 NOTE — Patient Outreach (Signed)
  Care Coordination   Initial Visit Note   07/08/2022 Name: DANALY BARI MRN: [id number] DOB: [birth year]  NOVA SCHMUHL is a 81 y.o. year old female who sees [name], MD for primary care. I spoke with  [name] by phone today  What matters to the patients health and wellness today?  Maintain health. Concern today is swelling of ankle/foot. She feels it may be due to taking sodium bicarbonate prescribed by nephrologist.   Goals Addressed             This Visit's Progress    Maintain my health       Care Coordination Interventions: Advised patient to contact provider that prescribed sodium bicarb to discuss concerns Encouraged to contact primary care provider to schedule annual wellness exam Medications reviewed and encouraged to continue to take as prescribed Encouraged patient to find out her target blood pressure range from provider Discussed elevate legs/feet when sitting or as much as possible when not active          SDOH assessments and interventions completed:  Yes  SDOH Interventions Today    Flowsheet Row Most Recent Value  SDOH Interventions   Food Insecurity Interventions Intervention Not Indicated  Housing Interventions Intervention Not Indicated  Transportation Interventions Intervention Not Indicated        Care Coordination Interventions Activated:  Yes  Care Coordination Interventions:  Yes, provided   Follow up plan: Follow up call scheduled for 08/05/22    Encounter Outcome:  Pt. Visit Completed   Thea Silversmith, RN, MSN, BSN, [place] Coordinator [phone]

## 2022-07-09 NOTE — Progress Notes (Signed)
[facility] Note  07/14/2022     CHIEF COMPLAINT Patient presents for Retina Follow Up   HISTORY OF PRESENT ILLNESS: Sarah [name] is a 81 y.o. female who presents to the clinic today for:  HPI     Retina Follow Up           Diagnosis: Other   Laterality: both eyes   Onset: 6 months ago         Comments   Patient here for 6 months retina follow up for CME OU. Patient states vision is ok. Alright. No eye pain. On Prolensa QAM OU and Rocklatan QHS OU.       Last edited by Theodore Demark, COA on 07/14/2022  1:17 PM.    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 Kathlen Mody for chronic uveitis/[place] [facility]   CURRENT MEDICATIONS: Current Outpatient Medications (Ophthalmic Drugs)  Medication Sig   brimonidine (ALPHAGAN) 0.2 % ophthalmic solution 1 drop 3 (three) times daily.   dorzolamide-timolol (COSOPT) 22.3-6.8 MG/ML ophthalmic solution INSTILL 1 DROP INTO BOTH EYES IN THE MORNING, AT NOON, AND AT BEDTIME   Netarsudil-Latanoprost (ROCKLATAN) 0.02-0.005 % SOLN Apply to eye.   No current facility-administered medications for this visit. (Ophthalmic Drugs)   Current Outpatient Medications (Other)  Medication Sig   acetaminophen (TYLENOL) 500 MG tablet Take 500 mg by mouth every 6 (six) hours as needed.   allopurinol (ZYLOPRIM) 100 MG tablet TAKE 2 TABLETS(200 MG) BY MOUTH DAILY   atorvastatin (LIPITOR) 10 MG tablet TAKE 1 TABLET(10 MG) BY MOUTH DAILY   diltiazem (TIAZAC) 300 MG 24 hr capsule TAKE 1 CAPSULE BY MOUTH DAILY BEFORE AND BREAKFAST (Patient taking differently: Take 300 mg by mouth daily. TAKE 1 CAPSULE BY MOUTH DAILY BEFORE BREAKFAST)   hydrALAZINE (APRESOLINE) 50 MG tablet Take 1 tablet (50 mg total) by mouth 3 (three)  times daily.   Nebivolol HCl 20 MG TABS TAKE 1 TABLET(20 MG) BY MOUTH DAILY   sodium bicarbonate 650 MG tablet Take 650 mg by mouth 2 (two) times daily.   SYMBICORT 160-4.5 MCG/ACT inhaler INHALE 2 PUFFS INTO THE LUNGS TWICE DAILY   zolpidem (AMBIEN) 5 MG tablet TAKE 1 TABLET(5 MG) BY MOUTH AT BEDTIME AS NEEDED FOR SLEEP   fluticasone (FLONASE) 50 MCG/ACT nasal spray Place 2 sprays into both nostrils daily. (Patient not taking: Reported on 07/14/2022)   levocetirizine (XYZAL) 5 MG tablet Take 1 tablet (5 mg total) by mouth every evening. (Patient not taking: Reported on 07/08/2022)   losartan (COZAAR) 50 MG tablet Take 1 tablet (50 mg total) by mouth daily. (Patient not taking: Reported on 07/08/2022)   No current facility-administered medications for this visit. (Other)   REVIEW OF SYSTEMS: ROS   Positive for: Neurological, Musculoskeletal, Eyes, Respiratory Negative for: Constitutional, Gastrointestinal, Skin, Genitourinary, HENT, Endocrine, Cardiovascular, Psychiatric, Allergic/Imm, Heme/Lymph Last edited by Theodore Demark, COA on 07/14/2022  1:17 PM.     ALLERGIES Allergies  Allergen Reactions   Lisinopril     REACTION: Lightheaded "sick"   PAST MEDICAL HISTORY Past Medical History:  Diagnosis Date   Allergic rhinitis    Arthritis    Chronic back pain    Contact dermatitis and other eczema, due to unspecified cause  Diabetes mellitus without complication ([place])    Dysrhythmia    hx of heart beating fast    Glaucoma    POAG OU   History of sinusitis    Hx of adenomatous colonic polyps 10/17/2014   Hypertension    Hypertensive retinopathy    OU   Peripheral vascular disease (HCC)    hx of DVT - left leg    Personal history of unspecified circulatory disease    Plantar fascial fibromatosis    Shortness of breath    with exertion    Past Surgical History:  Procedure Laterality Date   ABDOMINAL HYSTERECTOMY     CATARACT EXTRACTION Bilateral 2018   EYE SURGERY  Bilateral 2018   Cat Sx   FLEXIBLE SIGMOIDOSCOPY     FOOT SURGERY Bilateral    History of Lumbar fusion     '87   SHOULDER ARTHROSCOPY WITH ROTATOR CUFF REPAIR AND SUBACROMIAL DECOMPRESSION Left 01/14/2013   Procedure: Left Shoulder Arthroscopy with Debridement, Subacromial Decompression,rotator cuff repair;  Surgeon: Mcarthur Rossetti, MD;  Location: [facility];  Service: Orthopedics;  Laterality: Left;  Left Shoulder Arthroscopy with Debridement, Subacromial Decompression   FAMILY HISTORY Family History  Problem Relation Age of Onset   Leukemia Mother    Brain cancer Father        mets   Throat cancer Brother        x 2 brothers   SOCIAL HISTORY Social History   Tobacco Use   Smoking status: Never   Smokeless tobacco: Never  Vaping Use   Vaping Use: Never used  Substance Use Topics   Alcohol use: No   Drug use: No       OPHTHALMIC EXAM: Base Eye Exam     Visual Acuity (Snellen - Linear)       Right Left   Dist [place] 20/20 -1 20/25   Dist ph [place]  20/20 -1         Tonometry (Tonopen, 1:14 PM)       Right Left   Pressure 19 18         Pupils       Dark Light Shape React APD   Right 3 2 Round Minimal None   Left 3 2 Round Minimal None         Visual Fields (Counting fingers)       Left Right    Full Full         Extraocular Movement       Right Left    Full, Ortho Full, Ortho         Neuro/Psych     Oriented x3: Yes   Mood/Affect: Normal         Dilation     Both eyes: 1.0% Mydriacyl, 2.5% Phenylephrine @ 1:13 PM           Slit Lamp and Fundus Exam     Slit Lamp Exam       Right Left   Lids/Lashes Dermatochalasis - upper lid Dermatochalasis - upper lid   Conjunctiva/Sclera Melanosis, 1+Injection Melanosis, 1+ Injection   Cornea arcus, well healed temporal cataract wounds, trace Punctate epithelial erosions, early band K nasal and temporal arcus, well healed temporal cataract wounds, trace Punctate epithelial erosions,  early band K nasal and temporal   Anterior Chamber deep and clear, 0.5+pigment deep and clear, no cell or flare   Iris Round and dilated, mild atrophy Round and moderately dilated, mild, diffuse atrophy   Lens  PC IOL in good position PC IOL in good position   Vitreous Vitreous syneresis, +pigment, mild vitreous condensations Vitreous syneresis, fine pigment, vitreous condensations         Fundus Exam       Right Left   Disc Pink and Sharp, PPA/PPP Pink and Sharp, Compact, temporal PPA   C/D Ratio 0.5 0.5   Macula Flat, good foveal reflex, RPE mottling, drusen, No heme or edema -- CME stably resolved Flat, Blunted foveal reflex, central cystic changes - stably improved, drusen, RPE mottling, no heme   Vessels attenuated, Tortuous, mild Copper wiring attenuated, Tortuous, mild Copper wiring   Periphery Attached, rare MA Attached, trace peripheral MA           IMAGING AND PROCEDURES  Imaging and Procedures for 07/14/2022  OCT, Retina - OU - Both Eyes       Right Eye Quality was good. Central Foveal Thickness: 259. Progression has been stable. Findings include normal foveal contour, no IRF, no SRF, myopic contour, retinal drusen (No CME).   Left Eye Quality was good. Central Foveal Thickness: 256. Progression has been stable. Findings include normal foveal contour, no IRF, no SRF, myopic contour, retinal drusen (No CME).   Notes *Images captured and stored on drive  Diagnosis / Impression:  No CME OU  Clinical management:  See below  Abbreviations: NFP - Normal foveal profile. CME - cystoid macular edema. PED - pigment epithelial detachment. IRF - intraretinal fluid. SRF - subretinal fluid. EZ - ellipsoid zone. ERM - epiretinal membrane. ORA - outer retinal atrophy. ORT - outer retinal tubulation. SRHM - subretinal hyper-reflective material. IRHM - intraretinal hyper-reflective material             ASSESSMENT/PLAN:    ICD-10-CM   1. 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 Essential hypertension  I10     4. Hypertensive retinopathy of both eyes  H35.033     5. Pseudophakia, both eyes  Z96.1     6. Primary open angle glaucoma (POAG) of both eyes, mild stage  H40.1131       1,2. CME OU - [place] / posterior uveitis OU  - pt with history of chronic CME post cataract surgery OU in 2018  - history of improvement on topical steroid and NSAID, but recurrence w/ tapering; also history of steroid response   - FA 01.24.22 shows hyperfluorescence of disc and perifoveal hyperfluorescence OU consistent with [place] CME; also shows focal peripheral hyperfluorescence OU consistent with posterior uveitis / inflammatory component  - currently using Prolensa Qdaily OU (lotemax tapered off due to steroid response)  - IOP 19,18  - BCVA 20/20 OU  - OCT shows stable resolution of CME OU  - pt is off Lotemax SM - cont Prolensa qdaily OU             - cont Brim and Cosopt TID OU  - pt is cleared from a retina standpoint for release to Dr. Lucianne Lei and resumption of primary eye care  - f/u PRN  3,4. Hypertensive retinopathy OU - discussed importance of tight BP control - monitor  5. Pseudophakia OU  - s/p CE/IOL OU (2018)  - IOLs in good position  - h/o chronic CME as above  - monitor  6. POAG OU  - mild stage, s/p SLT OU x2, s/p goniotomy OU  - history  of steroid response  - IOP 19,18 today  - on rocklatan qhs OU  - cont cosopt and brimonidine TID OU per Dr. Lucianne Lei  Ophthalmic Meds Ordered this visit:  No orders of the defined types were placed in this encounter.    Return if symptoms worsen or fail to improve.  There are no Patient Instructions on file for this visit.  This document serves as a record of services personally performed by Gardiner Sleeper, MD, PhD. It was created on their behalf by Roselee Nova, COMT. The creation of this record is the  provider's dictation and/or activities during the visit.  Electronically signed by: Roselee Nova, COMT 07/14/22 1:32 PM  This document serves as a record of services personally performed by Gardiner Sleeper, MD, PhD. It was created on their behalf by San Jetty. Owens Shark, OA an ophthalmic technician. The creation of this record is the provider's dictation and/or activities during the visit.    Electronically signed by: San Jetty. Rimmer, [place] 08.14.2023 1:33 PM  Gardiner Sleeper, M.D., Ph.D. Diseases & Surgery of the Retina and Vitreous [facility]  I have reviewed the above documentation for accuracy and completeness, and I agree with the above. Gardiner Sleeper, M.D., Ph.D. 07/14/22 1:41 PM  Abbreviations: M myopia (nearsighted); A astigmatism; H hyperopia (farsighted); P presbyopia; Mrx spectacle prescription;  CTL contact lenses; OD right eye; OS left eye; OU both eyes  XT exotropia; ET esotropia; PEK punctate epithelial keratitis; PEE punctate epithelial erosions; DES dry eye syndrome; MGD meibomian gland dysfunction; ATs artificial tears; PFAT's preservative free artificial tears; [place] nuclear sclerotic cataract; PSC posterior subcapsular cataract; ERM epi-retinal membrane; PVD posterior vitreous detachment; RD retinal detachment; DM diabetes mellitus; DR diabetic retinopathy; NPDR non-proliferative diabetic retinopathy; PDR proliferative diabetic retinopathy; CSME clinically significant macular edema; DME diabetic macular edema; dbh dot blot hemorrhages; CWS cotton wool spot; POAG primary open angle glaucoma; C/D cup-to-disc ratio; HVF humphrey visual field; GVF goldmann visual field; OCT optical coherence tomography; IOP intraocular pressure; BRVO Branch retinal vein occlusion; CRVO central retinal vein occlusion; CRAO central retinal artery occlusion; BRAO branch retinal artery occlusion; RT retinal tear; SB scleral buckle; PPV pars plana vitrectomy; VH Vitreous hemorrhage; PRP  panretinal laser photocoagulation; IVK intravitreal kenalog; VMT vitreomacular traction; MH Macular hole;  NVD neovascularization of the disc; NVE neovascularization elsewhere; AREDS age related eye disease study; ARMD age related macular degeneration; POAG primary open angle glaucoma; EBMD epithelial/anterior basement membrane dystrophy; ACIOL anterior chamber intraocular lens; IOL intraocular lens; PCIOL posterior chamber intraocular lens; Phaco/IOL phacoemulsification with intraocular lens placement; [place] photorefractive keratectomy; LASIK laser assisted in situ keratomileusis; HTN hypertension; DM diabetes mellitus; COPD chronic obstructive pulmonary disease

## 2022-07-11 ENCOUNTER — Other Ambulatory Visit: Payer: Self-pay | Admitting: Internal Medicine

## 2022-07-11 DIAGNOSIS — I10 Essential (primary) hypertension: Secondary | ICD-10-CM

## 2022-07-14 ENCOUNTER — Ambulatory Visit (INDEPENDENT_AMBULATORY_CARE_PROVIDER_SITE_OTHER): Payer: Medicare Other | Admitting: Ophthalmology

## 2022-07-14 ENCOUNTER — Encounter (INDEPENDENT_AMBULATORY_CARE_PROVIDER_SITE_OTHER): Payer: Self-pay | Admitting: Ophthalmology

## 2022-07-14 DIAGNOSIS — H35033 Hypertensive retinopathy, bilateral: Secondary | ICD-10-CM | POA: Diagnosis not present

## 2022-07-14 DIAGNOSIS — H401131 Primary open-angle glaucoma, bilateral, mild stage: Secondary | ICD-10-CM

## 2022-07-14 DIAGNOSIS — H3093 Unspecified chorioretinal inflammation, bilateral: Secondary | ICD-10-CM | POA: Diagnosis not present

## 2022-07-14 DIAGNOSIS — I10 Essential (primary) hypertension: Secondary | ICD-10-CM

## 2022-07-14 DIAGNOSIS — Z961 Presence of intraocular lens: Secondary | ICD-10-CM

## 2022-07-14 DIAGNOSIS — H35353 Cystoid macular degeneration, bilateral: Secondary | ICD-10-CM | POA: Diagnosis not present

## 2022-07-14 DIAGNOSIS — N1832 Chronic kidney disease, stage 3b: Secondary | ICD-10-CM | POA: Diagnosis not present

## 2022-07-21 ENCOUNTER — Telehealth: Payer: Self-pay | Admitting: Internal Medicine

## 2022-07-21 NOTE — Telephone Encounter (Signed)
Placed health care provider certification in Dr. Ronnald Ramp bin up front - please contact patient at [phone]

## 2022-07-27 ENCOUNTER — Other Ambulatory Visit: Payer: Self-pay | Admitting: Internal Medicine

## 2022-07-27 DIAGNOSIS — J301 Allergic rhinitis due to pollen: Secondary | ICD-10-CM

## 2022-08-05 ENCOUNTER — Ambulatory Visit: Payer: Self-pay

## 2022-08-05 NOTE — Patient Outreach (Signed)
  Care Coordination   Follow Up Visit Note   08/05/2022 Name: TAVIE HASEMAN MRN: [id number] DOB: Oct 28, 1941  ZELIA YZAGUIRRE is a 81 y.o. year old female who sees [name], MD for primary care. I spoke with  [name] by phone today.  What matters to the patients health and wellness today? Without any complaints at this time. She reports that she contact her nephrologist, Dr. Royce Macadamia, regarding leg/feet swelling. She reports she has an appointment scheduled this month with Dr. Royce Macadamia. She reports swelling primarily when she is standing on them a lot.    Goals Addressed             This Visit's Progress    Care Coordination: Maintain my health       Care Coordination Interventions: Advised patient to attend provider visits as scheduled Discussed annual wellness visit/confirmed completed 01/28/22 Reiterated importance of knowing target blood pressure range Encouraged to continue to elevate legs/feet when sitting or as much as possible when not active Reviewed upcoming appointments          SDOH assessments and interventions completed:  No     Care Coordination Interventions Activated:  Yes  Care Coordination Interventions:  Yes, provided   Follow up plan: Follow up call scheduled for 09/08/22    Encounter Outcome:  Pt. Visit Completed   Thea Silversmith, RN, MSN, BSN, [place] Coordinator [phone]

## 2022-08-05 NOTE — Patient Instructions (Signed)
Visit Information  Thank you for taking time to visit with me today. Please don't hesitate to contact me if I can be of assistance to you.   Following are the goals we discussed today:   Goals Addressed             This Visit's Progress    Care Coordination: Maintain my health       Care Coordination Interventions: Advised patient to attend provider visits as scheduled Discussed annual wellness visit/confirmed completed 01/28/22 Reiterated importance of knowing target blood pressure range Encouraged to continue to elevate legs/feet when sitting or as much as possible when not active Reviewed upcoming appointments          Our next appointment is by telephone on 09/08/22 at 1:30 pm  Please call the care guide team at [phone] if you need to cancel or reschedule your appointment.   If you are experiencing a Mental Health or [place] or need someone to talk to, please call the Suicide and Crisis Lifeline: 988  The patient verbalized understanding of instructions, educational materials, and care plan provided today and DECLINED offer to receive copy of patient instructions, educational materials, and care plan.   Thea Silversmith, RN, MSN, BSN, CCM Care Coordinator [phone]

## 2022-08-28 DIAGNOSIS — N1832 Chronic kidney disease, stage 3b: Secondary | ICD-10-CM | POA: Diagnosis not present

## 2022-08-28 LAB — BASIC METABOLIC PANEL
BUN: 43 — AB (ref 4–21)
CO2: 21 (ref 13–22)
Chloride: 107 (ref 99–108)
Creatinine: 1.6 — AB (ref 0.5–1.1)
Glucose: 91
Potassium: 4.8 mEq/L (ref 3.5–5.1)
Sodium: 139 (ref 137–147)

## 2022-08-28 LAB — MICROALBUMIN / CREATININE URINE RATIO: Microalb Creat Ratio: 159

## 2022-08-28 LAB — CBC AND DIFFERENTIAL: Hemoglobin: 10.2 — AB (ref 12.0–16.0)

## 2022-08-28 LAB — COMPREHENSIVE METABOLIC PANEL
Albumin: 4.4 (ref 3.5–5.0)
Calcium: 9.6 (ref 8.7–10.7)
eGFR: 32

## 2022-08-28 LAB — PROTEIN / CREATININE RATIO, URINE: Creatinine, Urine: 103

## 2022-09-01 DIAGNOSIS — N189 Chronic kidney disease, unspecified: Secondary | ICD-10-CM | POA: Diagnosis not present

## 2022-09-02 DIAGNOSIS — D649 Anemia, unspecified: Secondary | ICD-10-CM | POA: Diagnosis not present

## 2022-09-02 DIAGNOSIS — E1122 Type 2 diabetes mellitus with diabetic chronic kidney disease: Secondary | ICD-10-CM | POA: Diagnosis not present

## 2022-09-02 DIAGNOSIS — I129 Hypertensive chronic kidney disease with stage 1 through stage 4 chronic kidney disease, or unspecified chronic kidney disease: Secondary | ICD-10-CM | POA: Diagnosis not present

## 2022-09-02 DIAGNOSIS — N1832 Chronic kidney disease, stage 3b: Secondary | ICD-10-CM | POA: Diagnosis not present

## 2022-09-03 ENCOUNTER — Ambulatory Visit (INDEPENDENT_AMBULATORY_CARE_PROVIDER_SITE_OTHER): Payer: Medicare Other | Admitting: Internal Medicine

## 2022-09-03 ENCOUNTER — Encounter: Payer: Self-pay | Admitting: Internal Medicine

## 2022-09-03 VITALS — BP 162/60 | HR 68 | Temp 98.0°F | Resp 16 | Ht 62.0 in | Wt 145.0 lb

## 2022-09-03 DIAGNOSIS — Z0000 Encounter for general adult medical examination without abnormal findings: Secondary | ICD-10-CM

## 2022-09-03 DIAGNOSIS — I10 Essential (primary) hypertension: Secondary | ICD-10-CM

## 2022-09-03 DIAGNOSIS — M1A39X0 Chronic gout due to renal impairment, multiple sites, without tophus (tophi): Secondary | ICD-10-CM | POA: Diagnosis not present

## 2022-09-03 DIAGNOSIS — F5105 Insomnia due to other mental disorder: Secondary | ICD-10-CM

## 2022-09-03 DIAGNOSIS — E790 Hyperuricemia without signs of inflammatory arthritis and tophaceous disease: Secondary | ICD-10-CM | POA: Diagnosis not present

## 2022-09-03 DIAGNOSIS — E785 Hyperlipidemia, unspecified: Secondary | ICD-10-CM

## 2022-09-03 DIAGNOSIS — F409 Phobic anxiety disorder, unspecified: Secondary | ICD-10-CM

## 2022-09-03 DIAGNOSIS — Z23 Encounter for immunization: Secondary | ICD-10-CM

## 2022-09-03 DIAGNOSIS — Z0001 Encounter for general adult medical examination with abnormal findings: Secondary | ICD-10-CM

## 2022-09-03 DIAGNOSIS — N183 Chronic kidney disease, stage 3 unspecified: Secondary | ICD-10-CM

## 2022-09-03 DIAGNOSIS — J301 Allergic rhinitis due to pollen: Secondary | ICD-10-CM

## 2022-09-03 MED ORDER — ALLOPURINOL 100 MG PO TABS: ORAL_TABLET | ORAL | 0 refills | Status: DC

## 2022-09-03 MED ORDER — ATORVASTATIN CALCIUM 10 MG PO TABS: 10.0000 mg | ORAL_TABLET | Freq: Every day | ORAL | 1 refills | Status: DC

## 2022-09-03 MED ORDER — NEBIVOLOL HCL 20 MG PO TABS: 1.0000 | ORAL_TABLET | Freq: Every day | ORAL | 1 refills | Status: DC

## 2022-09-03 MED ORDER — DILTIAZEM HCL ER BEADS 300 MG PO CP24: ORAL_CAPSULE | ORAL | 1 refills | Status: DC

## 2022-09-03 MED ORDER — FLUTICASONE PROPIONATE 50 MCG/ACT NA SUSP: 2.0000 | Freq: Every day | NASAL | 1 refills | Status: DC

## 2022-09-03 MED ORDER — ZOLPIDEM TARTRATE 5 MG PO TABS: 5.0000 mg | ORAL_TABLET | Freq: Every evening | ORAL | 1 refills | Status: DC | PRN

## 2022-09-03 MED ORDER — HYDRALAZINE HCL 50 MG PO TABS: 50.0000 mg | ORAL_TABLET | Freq: Three times a day (TID) | ORAL | 1 refills | Status: DC

## 2022-09-03 NOTE — Progress Notes (Signed)
Subjective:  Patient ID: Sarah [name], female    DOB: 06-10-1941  Age: 81 y.o. MRN: [id number]  CC: Annual Exam, Anemia, Hypertension, and Hyperlipidemia   HPI Sarah [name] presents for a CPX and f/up -   She is active and denies chest pain, shortness of breath, diaphoresis, or edema.  She recently saw nephrology.  Her antihypertensives have been adjusted.  Outpatient Medications Prior to Visit  Medication Sig Dispense Refill   acetaminophen (TYLENOL) 500 MG tablet Take 500 mg by mouth every 6 (six) hours as needed.     brimonidine (ALPHAGAN) 0.2 % ophthalmic solution 1 drop 3 (three) times daily.     dorzolamide-timolol (COSOPT) 22.3-6.8 MG/ML ophthalmic solution INSTILL 1 DROP INTO BOTH EYES IN THE MORNING, AT NOON, AND AT BEDTIME 10 mL 3   levocetirizine (XYZAL) 5 MG tablet TAKE 1 TABLET(5 MG) BY MOUTH EVERY EVENING 90 tablet 1   losartan (COZAAR) 50 MG tablet Take 1 tablet (50 mg total) by mouth daily. 90 tablet 1   Netarsudil-Latanoprost (ROCKLATAN) 0.02-0.005 % SOLN Apply to eye.     SYMBICORT 160-4.5 MCG/ACT inhaler INHALE 2 PUFFS INTO THE LUNGS TWICE DAILY 30.6 g 5   allopurinol (ZYLOPRIM) 100 MG tablet TAKE 2 TABLETS(200 MG) BY MOUTH DAILY 180 tablet 0   atorvastatin (LIPITOR) 10 MG tablet TAKE 1 TABLET(10 MG) BY MOUTH DAILY 90 tablet 1   diltiazem (TIAZAC) 300 MG 24 hr capsule TAKE 1 CAPSULE BY MOUTH DAILY BEFORE AND BREAKFAST (Patient taking differently: Take 300 mg by mouth daily. TAKE 1 CAPSULE BY MOUTH DAILY BEFORE BREAKFAST) 90 capsule 1   fluticasone (FLONASE) 50 MCG/ACT nasal spray Place 2 sprays into both nostrils daily. 48 g 1   hydrALAZINE (APRESOLINE) 50 MG tablet Take 1 tablet (50 mg total) by mouth 3 (three) times daily. 270 tablet 1   Nebivolol HCl 20 MG TABS TAKE 1 TABLET(20 MG) BY MOUTH DAILY 90 tablet 0   sodium bicarbonate 650 MG tablet Take 650 mg by mouth 2 (two) times daily.     zolpidem (AMBIEN) 5 MG tablet TAKE 1 TABLET(5 MG) BY MOUTH AT BEDTIME AS  NEEDED FOR SLEEP 90 tablet 1   No facility-administered medications prior to visit.    ROS Review of Systems  Constitutional:  Negative for appetite change, diaphoresis, fatigue and unexpected weight change.  HENT: Negative.    Eyes: Negative.   Respiratory:  Negative for cough, chest tightness, shortness of breath and wheezing.   Cardiovascular:  Negative for chest pain, palpitations and leg swelling.  Gastrointestinal:  Negative for abdominal pain, constipation, diarrhea, nausea and vomiting.  Endocrine: Negative.   Genitourinary: Negative.  Negative for difficulty urinating and dysuria.  Musculoskeletal: Negative.  Negative for arthralgias and myalgias.  Skin: Negative.   Neurological:  Negative for dizziness, weakness, light-headedness and headaches.  Hematological:  Negative for adenopathy. Does not bruise/bleed easily.  Psychiatric/Behavioral:  Positive for sleep disturbance. Negative for decreased concentration and dysphoric mood. The patient is not nervous/anxious.     Objective:  BP (!) 162/60 (BP Location: Left Arm, Patient Position: Sitting, Cuff Size: Large)   Pulse 68   Temp 98 F (36.7 C) (Oral)   Resp 16   Ht '5\' 2"'$  (1.575 m)   Wt 145 lb (65.8 kg)   SpO2 95%   BMI 26.52 kg/m   BP Readings from Last 3 Encounters:  09/03/22 (!) 162/60  01/21/22 (!) 158/60  10/08/21 (!) 144/66    Wt Readings from Last  3 Encounters:  09/03/22 145 lb (65.8 kg)  01/21/22 149 lb (67.6 kg)  10/08/21 148 lb (67.1 kg)    Physical Exam Vitals reviewed.  HENT:     Nose: Nose normal.     Mouth/Throat:     Mouth: Mucous membranes are moist.  Eyes:     General: No scleral icterus.    Conjunctiva/sclera: Conjunctivae normal.  Cardiovascular:     Rate and Rhythm: Normal rate and regular rhythm.     Heart sounds: Normal heart sounds, S1 normal and S2 normal. No murmur heard.    No friction rub. No gallop.     Comments: EKG- SR, 65 bpm +++artifact TWI in V1 No LVH or Q  waves Pulmonary:     Effort: Pulmonary effort is normal.     Breath sounds: No stridor. No wheezing, rhonchi or rales.  Abdominal:     General: Abdomen is flat.     Palpations: There is no mass.     Tenderness: There is no abdominal tenderness. There is no guarding.     Hernia: No hernia is present.  Musculoskeletal:     Cervical back: Neck supple.     Right lower leg: No edema.     Left lower leg: No edema.  Lymphadenopathy:     Cervical: No cervical adenopathy.  Skin:    General: Skin is warm and dry.  Neurological:     General: No focal deficit present.     Mental Status: She is alert.  Psychiatric:        Mood and Affect: Mood normal.        Behavior: Behavior normal.     Lab Results  Component Value Date   WBC 4.3 09/03/2022   HGB 10.6 (L) 09/03/2022   HCT 32.1 (L) 09/03/2022   PLT 210.0 09/03/2022   GLUCOSE 91 01/21/2022   CHOL 142 09/03/2022   TRIG 57.0 09/03/2022   HDL 67.50 09/03/2022   LDLCALC 63 09/03/2022   ALT 14 09/03/2022   AST 19 09/03/2022   NA 139 08/28/2022   K 4.8 08/28/2022   CL 107 08/28/2022   CREATININE 1.6 (A) 08/28/2022   BUN 43 (A) 08/28/2022   CO2 21 08/28/2022   TSH 2.36 09/03/2022   HGBA1C 5.7 02/18/2021   MICROALBUR 0.8 09/21/2018    US RENAL  Result Date: 11/11/2018 CLINICAL DATA:  Stage III chronic kidney disease, hypertension, diabetes mellitus EXAM: RENAL / URINARY TRACT ULTRASOUND COMPLETE COMPARISON:  None FINDINGS: Right Kidney: Renal measurements: 7.8 x 4.0 x 4.0 cm = volume: 65.8 mL. Cortical thinning. Increased cortical echogenicity. No mass, hydronephrosis or shadowing calcification Left Kidney: Renal measurements: 8.9 x 4.5 x 4.5 cm = volume: 92.1 mL. Cortical thinning. Increased cortical echogenicity. Simple appearing cyst at LEFT kidney 4.4 x 4.1 x 4.5 cm. No additional mass or hydronephrosis. No shadowing calcification. Bladder: Appears normal for degree of bladder distention. IMPRESSION: BILATERAL renal cortical  atrophy and medical renal disease changes. Simple appearing LEFT renal cyst 4.5 cm diameter. Electronically Signed   By: Lavonia Dana M.D.   On: 11/11/2018 09:28    Assessment & Plan:   Sarah [name] was seen today for annual exam, anemia, hypertension and hyperlipidemia.  Diagnoses and all orders for this visit:  Essential hypertension- Her blood pressure is not at goal.  She will work on her compliance with the antihypertensives. -     Cancel: Basic metabolic panel; Future -     TSH; Future -  Cancel: Urinalysis, Routine w reflex microscopic; Future -     CBC with Differential/Platelet; Future -     Nebivolol HCl 20 MG TABS; Take 1 tablet (20 mg total) by mouth daily. -     hydrALAZINE (APRESOLINE) 50 MG tablet; Take 1 tablet (50 mg total) by mouth 3 (three) times daily. -     diltiazem (TIAZAC) 300 MG 24 hr capsule; TAKE 1 CAPSULE BY MOUTH DAILY BEFORE AND BREAKFAST -     EKG 12-Lead -     CBC with Differential/Platelet -     TSH  CRI (chronic renal insufficiency), stage 3 (moderate)- Followed by nephrology. -     Cancel: Basic metabolic panel; Future -     Cancel: Urinalysis, Routine w reflex microscopic; Future -     Cancel: Microalbumin / creatinine urine ratio; Future -     CBC with Differential/Platelet; Future -     CBC with Differential/Platelet  Chronic gout due to renal impairment of multiple sites without tophus- Uric acid level is below 3.  Will decrease the dose of allopurinol. -     Uric acid; Future -     Discontinue: allopurinol (ZYLOPRIM) 100 MG tablet; TAKE 2 TABLETS(200 MG) BY MOUTH DAILY -     Uric acid -     allopurinol (ZYLOPRIM) 100 MG tablet; Take 1 tablet (100 mg total) by mouth daily. TAKE 2 TABLETS(200 MG) BY MOUTH DAILY  Hyperuricemia -     Uric acid; Future -     Discontinue: allopurinol (ZYLOPRIM) 100 MG tablet; TAKE 2 TABLETS(200 MG) BY MOUTH DAILY -     Uric acid -     allopurinol (ZYLOPRIM) 100 MG tablet; Take 1 tablet (100 mg total) by mouth daily.  TAKE 2 TABLETS(200 MG) BY MOUTH DAILY  Hyperlipidemia with target LDL less than 100- LDL goal achieved. Doing well on the statin  -     Lipid panel; Future -     Hepatic function panel; Future -     TSH; Future -     atorvastatin (LIPITOR) 10 MG tablet; Take 1 tablet (10 mg total) by mouth daily. -     TSH -     Hepatic function panel -     Lipid panel  Seasonal allergic rhinitis due to pollen -     fluticasone (FLONASE) 50 MCG/ACT nasal spray; Place 2 sprays into both nostrils daily.  Insomnia due to anxiety and fear -     zolpidem (AMBIEN) 5 MG tablet; Take 1 tablet (5 mg total) by mouth at bedtime as needed for sleep.  Flu vaccine need -     Flu Vaccine QUAD High Dose(Fluad)   I have discontinued Sarah [name]'s allopurinol and sodium bicarbonate. I have also changed her Nebivolol HCl, zolpidem, atorvastatin, and allopurinol. Additionally, I am having her maintain her acetaminophen, brimonidine, Rocklatan, Symbicort, losartan, dorzolamide-timolol, levocetirizine, hydrALAZINE, fluticasone, and diltiazem.  Meds ordered this encounter  Medications   Nebivolol HCl 20 MG TABS    Sig: Take 1 tablet (20 mg total) by mouth daily.    Dispense:  90 tablet    Refill:  1   hydrALAZINE (APRESOLINE) 50 MG tablet    Sig: Take 1 tablet (50 mg total) by mouth 3 (three) times daily.    Dispense:  270 tablet    Refill:  1   fluticasone (FLONASE) 50 MCG/ACT nasal spray    Sig: Place 2 sprays into both nostrils daily.    Dispense:  48  g    Refill:  1   DISCONTD: allopurinol (ZYLOPRIM) 100 MG tablet    Sig: TAKE 2 TABLETS(200 MG) BY MOUTH DAILY    Dispense:  180 tablet    Refill:  0   zolpidem (AMBIEN) 5 MG tablet    Sig: Take 1 tablet (5 mg total) by mouth at bedtime as needed for sleep.    Dispense:  90 tablet    Refill:  1   diltiazem (TIAZAC) 300 MG 24 hr capsule    Sig: TAKE 1 CAPSULE BY MOUTH DAILY BEFORE AND BREAKFAST    Dispense:  90 capsule    Refill:  1   atorvastatin  (LIPITOR) 10 MG tablet    Sig: Take 1 tablet (10 mg total) by mouth daily.    Dispense:  90 tablet    Refill:  1   allopurinol (ZYLOPRIM) 100 MG tablet    Sig: Take 1 tablet (100 mg total) by mouth daily. TAKE 2 TABLETS(200 MG) BY MOUTH DAILY    Dispense:  90 tablet    Refill:  1     Follow-up: Return in about 3 months (around 12/04/2022).  Scarlette Calico, MD

## 2022-09-03 NOTE — Patient Instructions (Signed)

## 2022-09-04 ENCOUNTER — Encounter: Payer: Self-pay | Admitting: Internal Medicine

## 2022-09-04 DIAGNOSIS — Z0289 Encounter for other administrative examinations: Secondary | ICD-10-CM

## 2022-09-04 LAB — CBC WITH DIFFERENTIAL/PLATELET
Basophils Absolute: 0 10*3/uL (ref 0.0–0.1)
Basophils Relative: 0.2 % (ref 0.0–3.0)
Eosinophils Absolute: 0.3 10*3/uL (ref 0.0–0.7)
Eosinophils Relative: 6.3 % — ABNORMAL HIGH (ref 0.0–5.0)
HCT: 32.1 % — ABNORMAL LOW (ref 36.0–46.0)
Hemoglobin: 10.6 g/dL — ABNORMAL LOW (ref 12.0–15.0)
Lymphocytes Relative: 30.3 % (ref 12.0–46.0)
Lymphs Abs: 1.3 10*3/uL (ref 0.7–4.0)
MCHC: 33.2 g/dL (ref 30.0–36.0)
MCV: 93.7 fl (ref 78.0–100.0)
Monocytes Absolute: 0.6 10*3/uL (ref 0.1–1.0)
Monocytes Relative: 13 % — ABNORMAL HIGH (ref 3.0–12.0)
Neutro Abs: 2.2 10*3/uL (ref 1.4–7.7)
Neutrophils Relative %: 50.2 % (ref 43.0–77.0)
Platelets: 210 10*3/uL (ref 150.0–400.0)
RBC: 3.42 Mil/uL — ABNORMAL LOW (ref 3.87–5.11)
RDW: 14.6 % (ref 11.5–15.5)
WBC: 4.3 10*3/uL (ref 4.0–10.5)

## 2022-09-04 LAB — LIPID PANEL
Cholesterol: 142 mg/dL (ref 0–200)
HDL: 67.5 mg/dL (ref >39.00)
LDL Cholesterol: 63 mg/dL (ref 0–99)
NonHDL: 74.62
Total CHOL/HDL Ratio: 2
Triglycerides: 57 mg/dL (ref 0.0–149.0)
VLDL: 11.4 mg/dL (ref 0.0–40.0)

## 2022-09-04 LAB — HEPATIC FUNCTION PANEL
ALT: 14 U/L (ref 0–35)
AST: 19 U/L (ref 0–37)
Albumin: 4.4 g/dL (ref 3.5–5.2)
Alkaline Phosphatase: 85 U/L (ref 39–117)
Bilirubin, Direct: 0.1 mg/dL (ref 0.0–0.3)
Total Bilirubin: 0.3 mg/dL (ref 0.2–1.2)
Total Protein: 7.5 g/dL (ref 6.0–8.3)

## 2022-09-04 LAB — URIC ACID: Uric Acid, Serum: 2.5 mg/dL (ref 2.4–7.0)

## 2022-09-04 LAB — TSH: TSH: 2.36 u[IU]/mL (ref 0.35–5.50)

## 2022-09-04 MED ORDER — ALLOPURINOL 100 MG PO TABS: 100.0000 mg | ORAL_TABLET | Freq: Every day | ORAL | 1 refills | Status: DC

## 2022-09-04 NOTE — Telephone Encounter (Signed)
From has been completed and given to PCP to review and sign

## 2022-09-04 NOTE — Telephone Encounter (Addendum)
Form has been signed and faxed back.  Copy filed with CMA Copy sent to charge  Pt informed original is ready for pick up

## 2022-09-04 NOTE — Telephone Encounter (Signed)
Patient brought back up another form and I gave to [employer]

## 2022-09-05 DIAGNOSIS — Z0001 Encounter for general adult medical examination with abnormal findings: Secondary | ICD-10-CM | POA: Insufficient documentation

## 2022-09-05 DIAGNOSIS — Z23 Encounter for immunization: Secondary | ICD-10-CM | POA: Insufficient documentation

## 2022-09-05 NOTE — Assessment & Plan Note (Signed)
Exam completed Labs reviewed Vaccines reviewed and updated No cancer screenings indicated Patient education was given

## 2022-09-08 ENCOUNTER — Ambulatory Visit: Payer: Self-pay

## 2022-09-08 NOTE — Patient Outreach (Signed)
  Care Coordination   Follow Up Visit Note   09/08/2022 Name: TAMYRAH BURBAGE MRN: [id number] DOB: [birth year]  NJERI VICENTE is a 81 y.o. year old female who sees [name], MD for primary care. I spoke with  [name] by phone today.  What matters to the patients health and wellness today?  Patient denies any questions or concerns. Last saw primary care provider on 09/03/22. She reports swelling/edema is resolved with medications changes and she reports she feels a lot better.    Goals Addressed             This Visit's Progress    Care Coordination: Maintain my health       Care Coordination Interventions: Advised patient to ask provider what her Target Blood pressure range is Reiterated importance of knowing target blood pressure range Reviewed upcoming appointments Medications reviewed and encouraged to continue to take as prescribed Advised patient to contact provider for Health questions/concerns Encouraged to keep a check on blood pressure and record routinely        SDOH assessments and interventions completed:  No  Care Coordination Interventions Activated:  Yes  Care Coordination Interventions:  Yes, provided   Follow up plan: Follow up call scheduled for 10/07/22    Encounter Outcome:  Pt. Visit Completed   Thea Silversmith, RN, MSN, BSN, [place] Coordinator [phone]

## 2022-09-08 NOTE — Patient Instructions (Signed)
Visit Information  Thank you for taking time to visit with me today. Please don't hesitate to contact me if I can be of assistance to you.   Following are the goals we discussed today:   Goals Addressed             This Visit's Progress    Care Coordination: Maintain my health       Care Coordination Interventions: Advised patient to ask provider what her Target Blood pressure range is Reiterated importance of knowing target blood pressure range Reviewed upcoming appointments Medications reviewed and encouraged to continue to take as prescribed Advised patient to contact provider for Health questions/concerns Encouraged to keep a check on blood pressure and record routinely        Our next appointment is by telephone on 10/07/22 at 1:45 pm  Please call the care guide team at [phone] if you need to cancel or reschedule your appointment.   If you are experiencing a Mental Health or [place] or need someone to talk to, please call the Suicide and Crisis Lifeline: [place], RN, MSN, BSN, [place] [phone]

## 2022-09-16 ENCOUNTER — Other Ambulatory Visit: Payer: Self-pay | Admitting: Internal Medicine

## 2022-09-16 DIAGNOSIS — J4540 Moderate persistent asthma, uncomplicated: Secondary | ICD-10-CM

## 2022-09-30 ENCOUNTER — Other Ambulatory Visit: Payer: Self-pay | Admitting: Internal Medicine

## 2022-09-30 DIAGNOSIS — I10 Essential (primary) hypertension: Secondary | ICD-10-CM

## 2022-10-07 ENCOUNTER — Ambulatory Visit: Payer: Self-pay

## 2022-10-07 NOTE — Patient Instructions (Signed)
Visit Information  Thank you for taking time to visit with me today. Please don't hesitate to contact me if I can be of assistance to you.   Following are the goals we discussed today:   Goals Addressed             This Visit's Progress    Care Coordination: Maintain my health       Care Coordination Interventions: Advised patient to continue to monitor and record blood pressure readings. Encouraged to follow up with primary care and/or kidney specialist with questions or concerns or if outside of recommended range Encouraged patient to call provider to get on the schedule for next recommended follow up visit(around 12/04/21 if possible. Encouraged to continue to monitor salt intake. Discussed the Holidays and healthy eating.        Our next appointment is by telephone on 11/11/22 at 11 am  Please call the care guide team at [phone] if you need to cancel or reschedule your appointment.   If you are experiencing a Mental Health or [place] or need someone to talk to, please call the Suicide and Crisis Lifeline: [phone]  Patient verbalizes understanding of instructions and care plan provided today and agrees to view in [place]. Active MyChart status and patient understanding of how to access instructions and care plan via MyChart confirmed with patient.     Thea Silversmith, RN, MSN, BSN, [place] Care Management Coordinator [phone]

## 2022-10-07 NOTE — Patient Outreach (Signed)
  Care Coordination   Follow Up Visit Note   10/07/2022 Name: Sarah [name] MRN: [id number] DOB: 31-Jul-1941  Sarah [name] is a 81 y.o. year old female who sees [name], MD for primary care. I spoke with  [name] by phone today.  What matters to the patients health and wellness today?  Ms. Contino reports she checks blood pressure, but does not record. Per review of chart-office visit note on 09/03/22 BP 162/60. She is unable to state her last blood pressure reading, but reports it was ok. She states she is taking her medications as prescribed.    Goals Addressed             This Visit's Progress    Care Coordination: Maintain my health       Care Coordination Interventions: Advised patient to continue to monitor and record blood pressure readings. Encouraged to follow up with primary care and/or kidney specialist with questions or concerns or if outside of recommended range Encouraged patient to call provider to get on the schedule for next recommended follow up visit(around 12/04/21 if possible. Encouraged to continue to monitor salt intake. Discussed the Holidays and healthy eating.        SDOH assessments and interventions completed:  No  Care Coordination Interventions Activated:  Yes  Care Coordination Interventions:  Yes, provided   Follow up plan: Follow up call scheduled for 11/11/22    Encounter Outcome:  Pt. Visit Completed   Thea Silversmith, RN, MSN, BSN, [place] Coordinator [phone]

## 2022-10-16 ENCOUNTER — Other Ambulatory Visit (INDEPENDENT_AMBULATORY_CARE_PROVIDER_SITE_OTHER): Payer: Self-pay | Admitting: Ophthalmology

## 2022-10-29 ENCOUNTER — Other Ambulatory Visit: Payer: Self-pay | Admitting: Internal Medicine

## 2022-10-29 DIAGNOSIS — I10 Essential (primary) hypertension: Secondary | ICD-10-CM

## 2022-11-11 ENCOUNTER — Telehealth: Payer: Self-pay

## 2022-11-11 NOTE — Patient Outreach (Signed)
  Care Coordination   11/11/2022 Name: Sarah [name] MRN: [id number] DOB: 12/27/1940   Care Coordination Outreach Attempts:  An unsuccessful telephone outreach was attempted today to offer the patient information about available care coordination services as a benefit of their health plan.   Follow Up Plan:  Additional outreach attempts will be made to offer the patient care coordination information and services.   Encounter Outcome:  No Answer   Care Coordination Interventions:  No, not indicated    Thea Silversmith, RN, MSN, BSN, [place] Coordinator [phone]

## 2022-12-04 ENCOUNTER — Ambulatory Visit (INDEPENDENT_AMBULATORY_CARE_PROVIDER_SITE_OTHER): Payer: Medicare Other | Admitting: Internal Medicine

## 2022-12-04 ENCOUNTER — Encounter: Payer: Self-pay | Admitting: Internal Medicine

## 2022-12-04 VITALS — BP 136/72 | HR 66 | Temp 98.4°F | Ht 62.0 in | Wt 146.0 lb

## 2022-12-04 DIAGNOSIS — I10 Essential (primary) hypertension: Secondary | ICD-10-CM | POA: Diagnosis not present

## 2022-12-04 DIAGNOSIS — J301 Allergic rhinitis due to pollen: Secondary | ICD-10-CM | POA: Diagnosis not present

## 2022-12-04 DIAGNOSIS — N183 Chronic kidney disease, stage 3 unspecified: Secondary | ICD-10-CM | POA: Diagnosis not present

## 2022-12-04 DIAGNOSIS — D638 Anemia in other chronic diseases classified elsewhere: Secondary | ICD-10-CM

## 2022-12-04 NOTE — Patient Instructions (Signed)
Hypertension, Adult High blood pressure (hypertension) is when the force of blood pumping through the arteries is too strong. The arteries are the blood vessels that carry blood from the heart throughout the body. Hypertension forces the heart to work harder to pump blood and may cause arteries to become narrow or stiff. Untreated or uncontrolled hypertension can lead to a heart attack, heart failure, a stroke, kidney disease, and other problems. A blood pressure reading consists of a higher number over a lower number. Ideally, your blood pressure should be below 120/80. The first ("top") number is called the systolic pressure. It is a measure of the pressure in your arteries as your heart beats. The second ("bottom") number is called the diastolic pressure. It is a measure of the pressure in your arteries as the heart relaxes. What are the causes? The exact cause of this condition is not known. There are some conditions that result in high blood pressure. What increases the risk? Certain factors may make you more likely to develop high blood pressure. Some of these risk factors are under your control, including: Smoking. Not getting enough exercise or physical activity. Being overweight. Having too much fat, sugar, calories, or salt (sodium) in your diet. Drinking too much alcohol. Other risk factors include: Having a personal history of heart disease, diabetes, high cholesterol, or kidney disease. Stress. Having a family history of high blood pressure and high cholesterol. Having obstructive sleep apnea. Age. The risk increases with age. What are the signs or symptoms? High blood pressure may not cause symptoms. Very high blood pressure (hypertensive crisis) may cause: Headache. Fast or irregular heartbeats (palpitations). Shortness of breath. Nosebleed. Nausea and vomiting. Vision changes. Severe chest pain, dizziness, and seizures. How is this diagnosed? This condition is diagnosed by  measuring your blood pressure while you are seated, with your arm resting on a flat surface, your legs uncrossed, and your feet flat on the floor. The cuff of the blood pressure monitor will be placed directly against the skin of your upper arm at the level of your heart. Blood pressure should be measured at least twice using the same arm. Certain conditions can cause a difference in blood pressure between your right and left arms. If you have a high blood pressure reading during one visit or you have normal blood pressure with other risk factors, you may be asked to: Return on a different day to have your blood pressure checked again. Monitor your blood pressure at home for 1 week or longer. If you are diagnosed with hypertension, you may have other blood or imaging tests to help your health care provider understand your overall risk for other conditions. How is this treated? This condition is treated by making healthy lifestyle changes, such as eating healthy foods, exercising more, and reducing your alcohol intake. You may be referred for counseling on a healthy diet and physical activity. Your health care provider may prescribe medicine if lifestyle changes are not enough to get your blood pressure under control and if: Your systolic blood pressure is above 130. Your diastolic blood pressure is above 80. Your personal target blood pressure may vary depending on your medical conditions, your age, and other factors. Follow these instructions at home: Eating and drinking  Eat a diet that is high in fiber and potassium, and low in sodium, added sugar, and fat. An example of this eating plan is called the DASH diet. DASH stands for Dietary Approaches to Stop Hypertension. To eat this way: Eat   plenty of fresh fruits and vegetables. Try to fill one half of your plate at each meal with fruits and vegetables. Eat whole grains, such as whole-wheat pasta, [place] rice, or whole-grain bread. Fill about one  fourth of your plate with whole grains. Eat or drink low-fat dairy products, such as skim milk or low-fat yogurt. Avoid fatty cuts of meat, processed or cured meats, and poultry with skin. Fill about one fourth of your plate with lean proteins, such as fish, chicken without skin, beans, eggs, or tofu. Avoid pre-made and processed foods. These tend to be higher in sodium, added sugar, and fat. Reduce your daily sodium intake. Many people with hypertension should eat less than 1,500 mg of sodium a day. Do not drink alcohol if: Your health care provider tells you not to drink. You are pregnant, may be pregnant, or are planning to become pregnant. If you drink alcohol: Limit how much you have to: 0-1 drink a day for women. 0-2 drinks a day for men. Know how much alcohol is in your drink. In the [place] one drink equals one 12 oz bottle of beer (355 mL), one 5 oz glass of wine (148 mL), or one 1 oz glass of hard liquor (44 mL). Lifestyle  Work with your health care provider to maintain a healthy body weight or to lose weight. Ask what an ideal weight is for you. Get at least 30 minutes of exercise that causes your heart to beat faster (aerobic exercise) most days of the week. Activities may include walking, swimming, or biking. Include exercise to strengthen your muscles (resistance exercise), such as Pilates or lifting weights, as part of your weekly exercise routine. Try to do these types of exercises for 30 minutes at least 3 days a week. Do not use any products that contain nicotine or tobacco. These products include cigarettes, chewing tobacco, and vaping devices, such as e-cigarettes. If you need help quitting, ask your health care provider. Monitor your blood pressure at home as told by your health care provider. Keep all follow-up visits. This is important. Medicines Take over-the-counter and prescription medicines only as told by your health care provider. Follow directions carefully. Blood  pressure medicines must be taken as prescribed. Do not skip doses of blood pressure medicine. Doing this puts you at risk for problems and can make the medicine less effective. Ask your health care provider about side effects or reactions to medicines that you should watch for. Contact a health care provider if you: Think you are having a reaction to a medicine you are taking. Have headaches that keep coming back (recurring). Feel dizzy. Have swelling in your ankles. Have trouble with your vision. Get help right away if you: Develop a severe headache or confusion. Have unusual weakness or numbness. Feel faint. Have severe pain in your chest or abdomen. Vomit repeatedly. Have trouble breathing. These symptoms may be an emergency. Get help right away. Call 911. Do not wait to see if the symptoms will go away. Do not drive yourself to the hospital. Summary Hypertension is when the force of blood pumping through your arteries is too strong. If this condition is not controlled, it may put you at risk for serious complications. Your personal target blood pressure may vary depending on your medical conditions, your age, and other factors. For most people, a normal blood pressure is less than 120/80. Hypertension is treated with lifestyle changes, medicines, or a combination of both. Lifestyle changes include losing weight, eating a healthy,   low-sodium diet, exercising more, and limiting alcohol. This information is not intended to replace advice given to you by your health care provider. Make sure you discuss any questions you have with your health care provider. Document Revised: 09/24/2021 Document Reviewed: 09/24/2021 Elsevier Patient Education  2023 [employer]  

## 2022-12-04 NOTE — Progress Notes (Signed)
Subjective:  Patient ID: Sarah [name], female    DOB: 05/09/1941  Age: 82 y.o. MRN: [id number]  CC: Anemia and Hypertension   HPI [name] presents for f/up -  She is active and denies chest pain, shortness of breath, diaphoresis, or edema.  Outpatient Medications Prior to Visit  Medication Sig Dispense Refill   acetaminophen (TYLENOL) 500 MG tablet Take 500 mg by mouth every 6 (six) hours as needed.     allopurinol (ZYLOPRIM) 100 MG tablet Take 1 tablet (100 mg total) by mouth daily. TAKE 2 TABLETS(200 MG) BY MOUTH DAILY 90 tablet 1   atorvastatin (LIPITOR) 10 MG tablet Take 1 tablet (10 mg total) by mouth daily. 90 tablet 1   brimonidine (ALPHAGAN) 0.2 % ophthalmic solution 1 drop 3 (three) times daily.     diltiazem (TIAZAC) 300 MG 24 hr capsule TAKE 1 CAPSULE BY MOUTH DAILY BEFORE BREAKFAST 90 capsule 1   dorzolamide-timolol (COSOPT) 2-0.5 % ophthalmic solution PLACE 1 DROP INTO BOTH EYES IN THE MORNING, AT NOON, AND AT BEDTIME 10 mL 3   fluticasone (FLONASE) 50 MCG/ACT nasal spray Place 2 sprays into both nostrils daily. 48 g 1   hydrALAZINE (APRESOLINE) 50 MG tablet Take 1 tablet (50 mg total) by mouth 3 (three) times daily. 270 tablet 1   levocetirizine (XYZAL) 5 MG tablet TAKE 1 TABLET(5 MG) BY MOUTH EVERY EVENING 90 tablet 1   Nebivolol HCl 20 MG TABS TAKE 1 TABLET(20 MG) BY MOUTH DAILY 90 tablet 1   Netarsudil-Latanoprost (ROCKLATAN) 0.02-0.005 % SOLN Apply to eye.     SYMBICORT 160-4.5 MCG/ACT inhaler INHALE 2 PUFFS INTO THE LUNGS TWICE DAILY 30.6 g 5   zolpidem (AMBIEN) 5 MG tablet Take 1 tablet (5 mg total) by mouth at bedtime as needed for sleep. 90 tablet 1   No facility-administered medications prior to visit.    ROS Review of Systems  Constitutional: Negative.  Negative for chills, diaphoresis, fatigue and fever.  HENT: Negative.    Eyes: Negative.   Respiratory:  Negative for cough, chest tightness, shortness of breath and wheezing.   Cardiovascular:   Negative for chest pain, palpitations and leg swelling.  Gastrointestinal:  Negative for abdominal pain, diarrhea, nausea and vomiting.  Endocrine: Negative.   Genitourinary: Negative.  Negative for difficulty urinating.  Musculoskeletal:  Positive for arthralgias. Negative for joint swelling and myalgias.  Skin: Negative.   Neurological:  Negative for dizziness, weakness and light-headedness.  Hematological:  Negative for adenopathy. Does not bruise/bleed easily.  Psychiatric/Behavioral: Negative.      Objective:  BP 136/72 (BP Location: Right Arm, Patient Position: Sitting, Cuff Size: Large)   Pulse 66   Temp 98.4 F (36.9 C) (Oral)   Ht '5\' 2"'$  (1.575 m)   Wt 146 lb (66.2 kg)   SpO2 97%   BMI 26.70 kg/m   BP Readings from Last 3 Encounters:  12/04/22 136/72  09/03/22 (!) 162/60  01/21/22 (!) 158/60    Wt Readings from Last 3 Encounters:  12/04/22 146 lb (66.2 kg)  09/03/22 145 lb (65.8 kg)  01/21/22 149 lb (67.6 kg)    Physical Exam Vitals reviewed.  HENT:     Nose: Nose normal.     Mouth/Throat:     Mouth: Mucous membranes are moist.  Eyes:     General: No scleral icterus.    Conjunctiva/sclera: Conjunctivae normal.  Cardiovascular:     Rate and Rhythm: Normal rate and regular rhythm.  Heart sounds: No murmur heard. Pulmonary:     Effort: Pulmonary effort is normal.     Breath sounds: No stridor. No wheezing, rhonchi or rales.  Abdominal:     General: Abdomen is flat.     Palpations: There is no mass.     Tenderness: There is no abdominal tenderness. There is no guarding.     Hernia: No hernia is present.  Musculoskeletal:        General: Normal range of motion.     Cervical back: Neck supple.     Right lower leg: No edema.     Left lower leg: No edema.  Lymphadenopathy:     Cervical: No cervical adenopathy.  Skin:    General: Skin is warm and dry.  Neurological:     General: No focal deficit present.     Mental Status: She is alert.   Psychiatric:        Mood and Affect: Mood normal.        Behavior: Behavior normal.     Lab Results  Component Value Date   WBC 4.3 09/03/2022   HGB 10.6 (L) 09/03/2022   HCT 32.1 (L) 09/03/2022   PLT 210.0 09/03/2022   GLUCOSE 91 01/21/2022   CHOL 142 09/03/2022   TRIG 57.0 09/03/2022   HDL 67.50 09/03/2022   LDLCALC 63 09/03/2022   ALT 14 09/03/2022   AST 19 09/03/2022   NA 139 08/28/2022   K 4.8 08/28/2022   CL 107 08/28/2022   CREATININE 1.6 (A) 08/28/2022   BUN 43 (A) 08/28/2022   CO2 21 08/28/2022   TSH 2.36 09/03/2022   HGBA1C 5.7 02/18/2021   MICROALBUR 0.8 09/21/2018    US RENAL  Result Date: 11/11/2018 CLINICAL DATA:  Stage III chronic kidney disease, hypertension, diabetes mellitus EXAM: RENAL / URINARY TRACT ULTRASOUND COMPLETE COMPARISON:  None FINDINGS: Right Kidney: Renal measurements: 7.8 x 4.0 x 4.0 cm = volume: 65.8 mL. Cortical thinning. Increased cortical echogenicity. No mass, hydronephrosis or shadowing calcification Left Kidney: Renal measurements: 8.9 x 4.5 x 4.5 cm = volume: 92.1 mL. Cortical thinning. Increased cortical echogenicity. Simple appearing cyst at LEFT kidney 4.4 x 4.1 x 4.5 cm. No additional mass or hydronephrosis. No shadowing calcification. Bladder: Appears normal for degree of bladder distention. 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 Simple appearing LEFT renal cyst 4.5 cm diameter. Electronically Signed   By: Lavonia Dana M.D.   On: 11/11/2018 09:28    Assessment & Plan:   Tranesha was seen today for anemia and hypertension.  Diagnoses and all orders for this visit:  Essential hypertension- Her blood pressure is well-controlled.  Seasonal allergic rhinitis due to pollen  CRI (chronic renal insufficiency), stage 3 (moderate)- She is followed by nephrology.  Anemia, chronic disease- Her H&H have been stable and she is asymptomatic.   I am having Sarah [name] maintain her acetaminophen,  brimonidine, Rocklatan, levocetirizine, hydrALAZINE, fluticasone, zolpidem, atorvastatin, allopurinol, Symbicort, diltiazem, dorzolamide-timolol, and Nebivolol HCl.  No orders of the defined types were placed in this encounter.    Follow-up: Return in about 6 months (around 06/04/2023).  Scarlette Calico, MD

## 2022-12-10 ENCOUNTER — Telehealth: Payer: Self-pay | Admitting: Internal Medicine

## 2022-12-10 NOTE — Telephone Encounter (Signed)
Patient came in and dropped off FMLA paperwork that needs to be filled out by Dr. Ronnald Ramp. Patient would like forms to be faxed to [phone] when completed. Patient would also like to be notified when forms are faxed. Best callback number is [phone]. Forms were placed in Dr. Adah Salvage box at the front.

## 2022-12-12 DIAGNOSIS — Z0289 Encounter for other administrative examinations: Secondary | ICD-10-CM

## 2022-12-12 NOTE — Telephone Encounter (Signed)
Form has been signed and faxed. Pt aware.

## 2022-12-12 NOTE — Telephone Encounter (Signed)
Pt requesting form to be completed for chronic back pain. CMA does not see requested Dx listed in her chart.   Form has been completed based on previous form completed located in the media tab for vision issues. I have added osteoarthritis and hypertension Dx as well as it listed on the problem list.  Form given to PCP for review and signature.

## 2022-12-17 NOTE — Telephone Encounter (Signed)
Form has been picked up.

## 2022-12-18 ENCOUNTER — Ambulatory Visit: Payer: Self-pay

## 2022-12-18 NOTE — Patient Instructions (Signed)
Visit Information  Thank you for taking time to visit with me today. Please don't hesitate to contact me if I can be of assistance to you.   Following are the goals we discussed today:   Goals Addressed             This Visit's Progress    Care Coordination: Maintain my health       Care Coordination Interventions: Discussed many viruses going around this time of year, cold, flu, RSV, Covid. Patient reports she has been very careful wearing a mask when she is out Encouraged patient to stay hydrated and gargle. Encouraged patient to notify provider if condition worsens or does not improve. Ms. Esty states this usually happens to her this time of year because she does not do well with the cold. RNCM confirmed patient saw primary care provider. Office visit on 12/04/22-per PCP note-BP well controlled.  She denies any issues with her blood pressure at this time.         Our next appointment is by telephone on 01/26/23 at 1:15pm  Please call the care guide team at [phone] if you need to cancel or reschedule your appointment.   If you are experiencing a Mental Health or [place] or need someone to talk to, please call the Suicide and Crisis Lifeline: [place], RN, MSN, BSN, [place] [phone]

## 2022-12-18 NOTE — Patient Outreach (Signed)
  Care Coordination   Follow Up Visit Note   12/18/2022 Name: KERINGTON HILDEBRANT MRN: [id number] DOB: 1941-05-10  MARISHKA RENTFROW is a 82 y.o. year old female who sees [name], MD for primary care. I spoke with  [name] by phone today.  What matters to the patients health and wellness today?  Ms. Kauer states she is not feeling well today, reports congestion back pain. She states her body does not do well with cold or hot weather conditions. She reports she has been in bed resting today. She states she has been staying hydrated, she has medication for pain and will follow up with provider if she does not improve.   Goals Addressed             This Visit's Progress    Care Coordination: Maintain my health       Care Coordination Interventions: Discussed many viruses going around this time of year, cold, flu, RSV, Covid. Patient reports she has been very careful wearing a mask when she is out Encouraged patient to stay hydrated and gargle. Encouraged patient to notify provider if condition worsens or does not improve. Ms. Balentine states this usually happens to her this time of year because she does not do well with the cold. RNCM confirmed patient saw primary care provider. Office visit on 12/04/22-per PCP note-BP well controlled.  She denies any issues with her blood pressure at this time.         SDOH assessments and interventions completed:  No  Care Coordination Interventions:  Yes, provided   Follow up plan: Follow up call scheduled for 01/26/23    Encounter Outcome:  Pt. Visit Completed   Thea Silversmith, RN, MSN, BSN, [place] Coordinator [phone]

## 2022-12-23 DIAGNOSIS — N1832 Chronic kidney disease, stage 3b: Secondary | ICD-10-CM | POA: Diagnosis not present

## 2022-12-30 DIAGNOSIS — H35031 Hypertensive retinopathy, right eye: Secondary | ICD-10-CM | POA: Diagnosis not present

## 2022-12-30 DIAGNOSIS — H524 Presbyopia: Secondary | ICD-10-CM | POA: Diagnosis not present

## 2022-12-30 DIAGNOSIS — H401131 Primary open-angle glaucoma, bilateral, mild stage: Secondary | ICD-10-CM | POA: Diagnosis not present

## 2022-12-30 DIAGNOSIS — H04123 Dry eye syndrome of bilateral lacrimal glands: Secondary | ICD-10-CM | POA: Diagnosis not present

## 2022-12-30 DIAGNOSIS — H59033 Cystoid macular edema following cataract surgery, bilateral: Secondary | ICD-10-CM | POA: Diagnosis not present

## 2023-01-22 ENCOUNTER — Other Ambulatory Visit (INDEPENDENT_AMBULATORY_CARE_PROVIDER_SITE_OTHER): Payer: Self-pay | Admitting: Ophthalmology

## 2023-01-26 ENCOUNTER — Ambulatory Visit: Payer: Self-pay

## 2023-01-26 NOTE — Patient Outreach (Signed)
  Care Coordination   Follow Up Visit Note   01/26/2023 Name: Sarah [name] MRN: [id number] DOB: January 30, 1941  Sarah [name] is a 82 y.o. year old female who sees [name], MD for primary care. I spoke with  [name] by phone today.  What matters to the patients health and wellness today?  [name] reports she is doing well. She reports laser surgery to left eye in March. She reports her daughter is taking her. Office visit with PCP completed 12/04/22. She denies any questions or concerns or care coordination needs at this time.  Goals Addressed             This Visit's Progress    COMPLETED: Care Coordination: Maintain my health       Interventions Today    Flowsheet Row Most Recent Value  Chronic Disease   Chronic disease during today's visit Hypertension (HTN)  General Interventions   General Interventions Discussed/Reviewed General Interventions Discussed, General Interventions Reviewed, Doctor Visits  Doctor Visits Discussed/Reviewed Doctor Visits Discussed, PCP  [PCP visit 12/04/22. Per PCP HTN well controlled. Reviewed upcoming provider visits. patient to have eye surgery on left eye. confirmed she has transportation/support post procedure.]  PCP/Specialist Visits Compliance with follow-up visit  [encouraged to attend appt with health advisor on 02/10/23.]  Education Interventions   Education Provided Provided Education  Provided Verbal Education On Other  [encouraged to continue to follow up with provider as recommended.]           SDOH assessments and interventions completed:  No  Care Coordination Interventions:  Yes, provided   Follow up plan: No further intervention required.   Encounter Outcome:  Pt. Visit Completed   Thea Silversmith, RN, MSN, BSN, [place] Coordinator [phone]

## 2023-01-26 NOTE — Patient Instructions (Addendum)
Visit Information  Thank you for taking time to visit with me today. Please don't hesitate to contact me if I can be of assistance to you.   Following are the goals we discussed today:   Goals Addressed             This Visit's Progress    COMPLETED: Care Coordination: Maintain my health       Interventions Today    Flowsheet Row Most Recent Value  Chronic Disease   Chronic disease during today's visit Hypertension (HTN)  General Interventions   General Interventions Discussed/Reviewed General Interventions Discussed, General Interventions Reviewed, Doctor Visits  Doctor Visits Discussed/Reviewed Doctor Visits Discussed, PCP  [PCP visit 12/04/22. Per PCP HTN well controlled. Reviewed upcoming provider visits. patient to have eye surgery on left eye. confirmed she has transportation/support post procedure.]  PCP/Specialist Visits Compliance with follow-up visit  [encouraged to attend appt with health advisor on 02/10/23.]  Education Interventions   Education Provided Provided Education  Provided Verbal Education On Other  [encouraged to continue to follow up with provider as recommended.]           Please call the care guide team at [phone] if you need to cancel or reschedule your appointment.   If you are experiencing a Mental Health or [place] or need someone to talk to, please call the Suicide and Crisis Lifeline: [phone]  No Follow up Required   Thea Silversmith, RN, MSN, BSN, [place] Coordinator [phone]

## 2023-01-29 ENCOUNTER — Ambulatory Visit: Payer: Medicare Other

## 2023-01-29 DIAGNOSIS — H401131 Primary open-angle glaucoma, bilateral, mild stage: Secondary | ICD-10-CM | POA: Diagnosis not present

## 2023-02-05 ENCOUNTER — Other Ambulatory Visit: Payer: Self-pay | Admitting: Internal Medicine

## 2023-02-05 DIAGNOSIS — M1A39X0 Chronic gout due to renal impairment, multiple sites, without tophus (tophi): Secondary | ICD-10-CM

## 2023-02-05 DIAGNOSIS — E790 Hyperuricemia without signs of inflammatory arthritis and tophaceous disease: Secondary | ICD-10-CM

## 2023-02-05 DIAGNOSIS — I10 Essential (primary) hypertension: Secondary | ICD-10-CM

## 2023-02-10 ENCOUNTER — Ambulatory Visit (INDEPENDENT_AMBULATORY_CARE_PROVIDER_SITE_OTHER): Payer: Medicare Other

## 2023-02-10 VITALS — Ht 62.0 in | Wt 144.0 lb

## 2023-02-10 DIAGNOSIS — Z0000 Encounter for general adult medical examination without abnormal findings: Secondary | ICD-10-CM | POA: Diagnosis not present

## 2023-02-10 NOTE — Progress Notes (Addendum)
I connected with  [name] on 02/10/23 by a audio enabled telemedicine application and verified that I am speaking with the correct person using two identifiers.  Patient Location: Home  Provider Location: Office/Clinic  I discussed the limitations of evaluation and management by telemedicine. The patient expressed understanding and agreed to proceed.  Subjective:   Sarah [name] is a 82 y.o. female who presents for Medicare Annual (Subsequent) preventive examination.  Review of Systems     Cardiac Risk Factors include: advanced age (>69mn, >>63women);dyslipidemia;hypertension     Objective:    Today's Vitals   02/10/23 0917  Weight: 144 lb (65.3 kg)  Height: '5\' 2"'$  (1.575 m)  PainSc: 0-No pain   Body mass index is 26.34 kg/m.     02/10/2023    9:19 AM 01/28/2022    3:47 PM 01/21/2021    2:03 PM 10/24/2019    1:07 PM 06/30/2017    4:21 PM 01/07/2013    9:27 AM  Advanced Directives  Does Patient Have a Medical Advance Directive? Yes No Yes No No Patient does not have advance directive  Type of AScientist, forensicPower of [place] will       Does patient want to make changes to medical advance directive?   No - Patient declined     Copy of HTamaracin Chart? No - copy requested       Would patient like information on creating a medical advance directive?  No - Patient declined  No - Patient declined Yes (ED - Information included in AVS)   Pre-existing out of facility DNR order (yellow form or pink MOST form)      No    Current Medications (verified) Outpatient Encounter Medications as of 02/10/2023  Medication Sig   acetaminophen (TYLENOL) 500 MG tablet Take 500 mg by mouth every 6 (six) hours as needed.   allopurinol (ZYLOPRIM) 100 MG tablet TAKE 2 TABLETS(200 MG) BY MOUTH DAILY   atorvastatin (LIPITOR) 10 MG tablet Take 1 tablet (10 mg total) by mouth daily.   brimonidine (ALPHAGAN) 0.2 % ophthalmic solution 1 drop 3 (three)  times daily.   diltiazem (TIAZAC) 300 MG 24 hr capsule TAKE 1 CAPSULE BY MOUTH DAILY BEFORE BREAKFAST   dorzolamide-timolol (COSOPT) 2-0.5 % ophthalmic solution PLACE 1 DROP INTO BOTH EYES IN THE MORNING, AT NOON, AND AT BEDTIME   fluticasone (FLONASE) 50 MCG/ACT nasal spray Place 2 sprays into both nostrils daily.   hydrALAZINE (APRESOLINE) 50 MG tablet TAKE 1 TABLET(50 MG) BY MOUTH THREE TIMES DAILY   levocetirizine (XYZAL) 5 MG tablet TAKE 1 TABLET(5 MG) BY MOUTH EVERY EVENING   Nebivolol HCl 20 MG TABS TAKE 1 TABLET(20 MG) BY MOUTH DAILY   Netarsudil-Latanoprost (ROCKLATAN) 0.02-0.005 % SOLN Apply to eye.   SYMBICORT 160-4.5 MCG/ACT inhaler INHALE 2 PUFFS INTO THE LUNGS TWICE DAILY   zolpidem (AMBIEN) 5 MG tablet Take 1 tablet (5 mg total) by mouth at bedtime as needed for sleep.   [DISCONTINUED] valsartan (DIOVAN) 160 MG tablet Take 1 tablet (160 mg total) by mouth daily.   No facility-administered encounter medications on file as of 02/10/2023.    Allergies (verified) Lisinopril   History: Past Medical History:  Diagnosis Date   Allergic rhinitis    Arthritis    Chronic back pain    Contact dermatitis and other eczema, due to unspecified cause    Diabetes mellitus without complication ([place]    Dysrhythmia    hx  of heart beating fast    Glaucoma    POAG OU   History of sinusitis    Hx of adenomatous colonic polyps 10/17/2014   Hypertension    Hypertensive retinopathy    OU   Peripheral vascular disease (HCC)    hx of DVT - left leg    Personal history of unspecified circulatory disease    Plantar fascial fibromatosis    Shortness of breath    with exertion    Past Surgical History:  Procedure Laterality Date   ABDOMINAL HYSTERECTOMY     CATARACT EXTRACTION Bilateral 2018   EYE SURGERY Bilateral 2018   Cat Sx   FLEXIBLE SIGMOIDOSCOPY     FOOT SURGERY Bilateral    History of Lumbar fusion     '87   SHOULDER ARTHROSCOPY WITH ROTATOR CUFF REPAIR AND SUBACROMIAL  DECOMPRESSION Left 01/14/2013   Procedure: Left Shoulder Arthroscopy with Debridement, Subacromial Decompression,rotator cuff repair;  Surgeon: Mcarthur Rossetti, MD;  Location: [facility];  Service: Orthopedics;  Laterality: Left;  Left Shoulder Arthroscopy with Debridement, Subacromial Decompression   Family History  Problem Relation Age of Onset   Leukemia Mother    Brain cancer Father        mets   Throat cancer Brother        x 2 brothers   Social History   Socioeconomic History   Marital status: Legally Separated    Spouse name: Not on file   Number of children: 2   Years of education: Not on file   Highest education level: Not on file  Occupational History   Occupation: retired  Tobacco Use   Smoking status: Never   Smokeless tobacco: Never  Vaping Use   Vaping Use: Never used  Substance and Sexual Activity   Alcohol use: No   Drug use: No   Sexual activity: Not Currently  Other Topics Concern   Not on file  Social History Narrative   HSG. Married- '63. 1 daughter '64, 1 son '70,  2 grandchildren. Work: retired. Lives with husband and brother is in the home.   Social Determinants of Health   Financial Resource Strain: Low Risk  (02/10/2023)   Overall Financial Resource Strain (CARDIA)    Difficulty of Paying Living Expenses: Not hard at all  Food Insecurity: No Food Insecurity (02/10/2023)   Hunger Vital Sign    Worried About Running Out of Food in the Last Year: Never true    Ran Out of Food in the Last Year: Never true  Transportation Needs: No Transportation Needs (02/10/2023)   PRAPARE - Hydrologist (Medical): No    Lack of Transportation (Non-Medical): No  Physical Activity: Sufficiently Active (02/10/2023)   Exercise Vital Sign    Days of Exercise per Week: 5 days    Minutes of Exercise per Session: 30 min  Stress: No Stress Concern Present (02/10/2023)   [place]    Feeling of Stress : Not at all  Social Connections: [place] (02/10/2023)   Social Connection and Isolation Panel [NHANES]    Frequency of Communication with Friends and Family: More than three times a week    Frequency of Social Gatherings with Friends and Family: Once a week    Attends Religious Services: More than 4 times per year    Active Member of [employer] or Organizations: No    Attends Music therapist: More than 4 times  per year    Marital Status: Married    Tobacco Counseling Counseling given: Not Answered   Clinical Intake:  Pre-visit preparation completed: Yes  Pain : No/denies pain Pain Score: 0-No pain     BMI - recorded: 26.34 Nutritional Status: BMI 25 -29 Overweight Nutritional Risks: None Diabetes: No  How often do you need to have someone help you when you read instructions, pamphlets, or other written materials from your doctor or pharmacy?: 1 - Never What is the last grade level you completed in school?: HSG  Diabetic? No  Interpreter Needed?: No  Information entered by :: Lisette Abu, LPN.   Activities of Daily Living    02/10/2023    9:24 AM  In your present state of health, do you have any difficulty performing the following activities:  Hearing? 0  Vision? 0  Difficulty concentrating or making decisions? 0  Walking or climbing stairs? 0  Dressing or bathing? 0  Doing errands, shopping? 0  Preparing Food and eating ? N  Using the Toilet? N  In the past six months, have you accidently leaked urine? N  Do you have problems with loss of bowel control? N  Managing your Medications? N  Managing your Finances? N  Housekeeping or managing your Housekeeping? N    Patient Care Team: [name], MD as PCP - General (Internal Medicine) Regal, Tamala Fothergill, DPM as Consulting Physician (Podiatry) Claudia Desanctis, MD as Consulting Physician (Internal Medicine) [name], MD as Consulting Physician  (Ophthalmology) Szabat, Darnelle Maffucci, [facility] (Inactive) (Pharmacist) Lisabeth Pick, MD as Consulting Physician (Ophthalmology)  Indicate any recent Medical Services you may have received from other than [facility] providers in the past year (date may be approximate).     Assessment:   This is a routine wellness examination for [place].  Hearing/Vision screen Hearing Screening - Comments:: Denies hearing difficulties   Vision Screening - Comments:: Wears rx glasses - up to date with routine eye exams with Arnoldo Hooker, MD   Dietary issues and exercise activities discussed: Current Exercise Habits: Home exercise routine, Type of exercise: walking, Time (Minutes): 30, Frequency (Times/Week): 5, Weekly Exercise (Minutes/Week): 150, Intensity: Moderate, Exercise limited by: respiratory conditions(s);orthopedic condition(s)   Goals Addressed             This Visit's Progress    Care Coordination: Maintain my health       Interventions Today    Flowsheet Row Most Recent Value  Chronic Disease   Chronic disease during today's visit Hypertension (HTN)  General Interventions   General Interventions Discussed/Reviewed General Interventions Discussed, General Interventions Reviewed, Doctor Visits  Doctor Visits Discussed/Reviewed Doctor Visits Discussed, PCP  [PCP visit 12/04/22. Per PCP HTN well controlled. Reviewed upcoming provider visits. patient to have eye surgery on left eye. confirmed she has transportation/support post procedure.]  PCP/Specialist Visits Compliance with follow-up visit  [encouraged to attend appt with health advisor on 02/10/23.]  Education Interventions   Education Provided Provided Education  Provided Verbal Education On Other  [encouraged to continue to follow up with provider as recommended.]           Depression Screen    02/10/2023    9:51 AM 02/10/2023    9:23 AM 01/21/2022    3:10 PM 01/21/2021    2:26 PM 10/24/2019    1:10 PM 09/22/2018   11:54 AM 03/23/2018     2:12 PM  PHQ 2/9 Scores  PHQ - 2 Score 0 0 0 0 1  0 1  PHQ- 9 Score '1 1     2  '$ Exception Documentation Other- indicate reason in comment box Patient refusal         Fall Risk    02/10/2023    9:20 AM 01/28/2022    3:49 PM 01/21/2022    3:10 PM 01/21/2021    2:04 PM 10/24/2019    1:09 PM  [place] in the past year? 0 0 0 0 0  Number falls in past yr: 0 0  0 0  Injury with Fall? 0 0  0 0  Risk for fall due to : No Fall Risks No Fall Risks  No Fall Risks   Follow up Falls prevention discussed Falls evaluation completed   Falls prevention discussed    FALL RISK PREVENTION PERTAINING TO THE HOME:  Any stairs in or around the home? Yes  If so, are there any without handrails? No  Home free of loose throw rugs in walkways, pet beds, electrical cords, etc? Yes  Adequate lighting in your home to reduce risk of falls? Yes   ASSISTIVE DEVICES UTILIZED TO PREVENT FALLS:  Life alert? No  Use of a cane, walker or w/c? No  Grab bars in the bathroom? Yes  Shower chair or bench in shower? Yes  Elevated toilet seat or a handicapped toilet? No   TIMED UP AND GO:  Was the test performed? No . Telephonic Visit  Cognitive Function:    06/30/2017    5:03 PM  MMSE - Mini Mental State Exam  Orientation to time 5  Orientation to Place 5  Registration 3  Attention/ Calculation 4  Recall 2  Language- name 2 objects 2  Language- repeat 1  Language- follow 3 step command 3  Language- read & follow direction 1  Write a sentence 1  Copy design 1  Total score 28        02/10/2023    9:20 AM  6CIT Screen  What Year? 0 points  What month? 0 points  What time? 0 points  Count back from 20 0 points  Months in reverse 0 points  Repeat phrase 0 points  Total Score 0 points    Immunizations Immunization History  Administered Date(s) Administered   Fluad Quad(high Dose 65+) 08/01/2019, 09/03/2022   Influenza Split 09/08/2011   Influenza, High Dose Seasonal PF 10/15/2017,  09/21/2018   Influenza, Seasonal, Injecte, Preservative Fre 12/08/2012   Influenza,inj,Quad PF,6+ Mos 08/09/2013, 08/14/2014, 01/24/2016, 07/22/2016   Influenza-Unspecified 11/11/2020, 08/31/2021   Moderna Covid-19 Vaccine Bivalent Booster 68yr & up 05/30/2022   Moderna Sars-Covid-2 Vaccination 01/13/2020, 02/10/2020, 12/02/2020   Pneumococcal Conjugate-13 12/18/2014   Pneumococcal Polysaccharide-23 08/09/2013, 10/10/2019   Tdap 05/21/2017   Tetanus 02/09/2014   Zoster Recombinat (Shingrix) 05/30/2022, 08/22/2022   Zoster, Live 02/09/2014    TDAP status: Up to date  Flu Vaccine status: Up to date  Pneumococcal vaccine status: Up to date  Covid-19 vaccine status: Completed vaccines  Qualifies for Shingles Vaccine? Yes   Zostavax completed Yes   Shingrix Completed?: Yes  Screening Tests Health Maintenance  Topic Date Due   FOOT EXAM  09/23/2019   HEMOGLOBIN A1C  08/21/2021   COVID-19 Vaccine (5 - 2023-24 season) 08/01/2022   OPHTHALMOLOGY EXAM  01/21/2023   Diabetic kidney evaluation - eGFR measurement  08/29/2023   Diabetic kidney evaluation - Urine ACR  08/29/2023   Medicare Annual Wellness (AWV)  02/10/2024   DTaP/Tdap/Td (2 - Td or Tdap)  05/22/2027   Pneumonia Vaccine 27+ Years old  Completed   INFLUENZA VACCINE  Completed   DEXA SCAN  Completed   Zoster Vaccines- Shingrix  Completed   HPV VACCINES  Aged Out   COLONOSCOPY (Pts 45-72yr Insurance coverage will need to be confirmed)  Discontinued    Health Maintenance  Health Maintenance Due  Topic Date Due   FOOT EXAM  09/23/2019   HEMOGLOBIN A1C  08/21/2021   COVID-19 Vaccine (5 - 2023-24 season) 08/01/2022   OPHTHALMOLOGY EXAM  01/21/2023    Colorectal cancer screening: No longer required.   Mammogram status: Completed is scheduled for 01/2023 at [facility]. Repeat every year  Bone Density status: Completed is scheduled for 01/2023 at [facility]. Results reflect: Bone density results:  OSTEOPENIA. Repeat every 2-3 years.  Lung Cancer Screening: (Low Dose CT Chest recommended if Age 81-80years, 30 pack-year currently smoking OR have quit w/in 15years.) does not qualify.   Lung Cancer Screening Referral: no  Additional Screening:  Hepatitis C Screening: does not qualify; Completed no  Vision Screening: Recommended annual ophthalmology exams for early detection of glaucoma and other disorders of the eye. Is the patient up to date with their annual eye exam?  Yes  Who is the provider or what is the name of the office in which the patient attends annual eye exams? [facility] MD. If pt is not established with a provider, would they like to be referred to a provider to establish care? No .   Dental Screening: Recommended annual dental exams for proper oral hygiene  Community Resource Referral / Chronic Care Management: CRR required this visit?  No   CCM required this visit?  No      Plan:     I have personally reviewed and noted the following in the patient's chart:   Medical and social history Use of alcohol, tobacco or illicit drugs  Current medications and supplements including opioid prescriptions. Patient is not currently taking opioid prescriptions. Functional ability and status Nutritional status Physical activity Advanced directives List of other physicians Hospitalizations, surgeries, and ER visits in previous 12 months Vitals Screenings to include cognitive, depression, and falls Referrals and appointments  In addition, I have reviewed and discussed with patient certain preventive protocols, quality metrics, and best practice recommendations. A written personalized care plan for preventive services as well as general preventive health recommendations were provided to patient.     SSheral Flow LPN   [id number]  Nurse Notes: Normal cognitive status assessed by direct observation by this Nurse Health Advisor. No abnormalities found.

## 2023-02-10 NOTE — Patient Instructions (Addendum)
Sarah [name] , Thank you for taking time to come for your Medicare Wellness Visit. I appreciate your ongoing commitment to your health goals. Please review the following plan we discussed and let me know if I can assist you in the future.   These are the goals we discussed:  Goals      Care Coordination: Maintain my health     Interventions Today    Flowsheet Row Most Recent Value  Chronic Disease   Chronic disease during today's visit Hypertension (HTN)  General Interventions   General Interventions Discussed/Reviewed General Interventions Discussed, General Interventions Reviewed, Doctor Visits  Doctor Visits Discussed/Reviewed Doctor Visits Discussed, PCP  [PCP visit 12/04/22. Per PCP HTN well controlled. Reviewed upcoming provider visits. patient to have eye surgery on left eye. confirmed she has transportation/support post procedure.]  PCP/Specialist Visits Compliance with follow-up visit  [encouraged to attend appt with health advisor on 02/10/23.]  Education Interventions   Education Provided Provided Education  Provided Verbal Education On Other  [encouraged to continue to follow up with provider as recommended.]          Manage My Medicine     Timeframe:  Long-Range Goal Priority:  Medium Start Date:    04/26/21                         Expected End Date:    03/15/2023                 Follow Up Date 05/2022   - call for medicine refill 2 or 3 days before it runs out - call if I am sick and can't take my medicine - keep a list of all the medicines I take; vitamins and herbals too    Why is this important?   These steps will help you keep on track with your medicines.   Notes:      Stay as healthy as possible to keep blood pressure and blood sugar in parameter     I will continue to follow heart healthy and diabetic diet.         This is a list of the screening recommended for you and due dates:  Health Maintenance  Topic Date Due   Complete foot exam   09/23/2019    Hemoglobin A1C  08/21/2021   COVID-19 Vaccine (5 - 2023-24 season) 08/01/2022   Eye exam for diabetics  01/21/2023   Yearly kidney function blood test for diabetes  08/29/2023   Yearly kidney health urinalysis for diabetes  08/29/2023   Medicare Annual Wellness Visit  02/10/2024   DTaP/Tdap/Td vaccine (2 - Td or Tdap) 05/22/2027   Pneumonia Vaccine  Completed   Flu Shot  Completed   DEXA scan (bone density measurement)  Completed   Zoster (Shingles) Vaccine  Completed   HPV Vaccine  Aged Out   Colon Cancer Screening  Discontinued    Advanced directives: Yes  Conditions/risks identified: Yes  Next appointment: Follow up in one year for your annual wellness visit.   Preventive Care 55 Years and Older, Female Preventive care refers to lifestyle choices and visits with your health care provider that can promote health and wellness. What does preventive care include? A yearly physical exam. This is also called an annual well check. Dental exams once or twice a year. Routine eye exams. Ask your health care provider how often you should have your eyes checked. Personal lifestyle choices, including: Daily care of your teeth  and gums. Regular physical activity. Eating a healthy diet. Avoiding tobacco and drug use. Limiting alcohol use. Practicing safe sex. Taking low-dose aspirin every day. Taking vitamin and mineral supplements as recommended by your health care provider. What happens during an annual well check? The services and screenings done by your health care provider during your annual well check will depend on your age, overall health, lifestyle risk factors, and family history of disease. Counseling  Your health care provider may ask you questions about your: Alcohol use. Tobacco use. Drug use. Emotional well-being. Home and relationship well-being. Sexual activity. Eating habits. History of falls. Memory and ability to understand (cognition). Work and work  Statistician. Reproductive health. Screening  You may have the following tests or measurements: Height, weight, and BMI. Blood pressure. Lipid and cholesterol levels. These may be checked every 5 years, or more frequently if you are over 43 years old. Skin check. Lung cancer screening. You may have this screening every year starting at age 31 if you have a 30-pack-year history of smoking and currently smoke or have quit within the past 15 years. Fecal occult blood test (FOBT) of the stool. You may have this test every year starting at age 86. Flexible sigmoidoscopy or colonoscopy. You may have a sigmoidoscopy every 5 years or a colonoscopy every 10 years starting at age 63. Hepatitis C blood test. Hepatitis B blood test. Sexually transmitted disease (STD) testing. Diabetes screening. This is done by checking your blood sugar (glucose) after you have not eaten for a while (fasting). You may have this done every 1-3 years. Bone density scan. This is done to screen for osteoporosis. You may have this done starting at age 33. Mammogram. This may be done every 1-2 years. Talk to your health care provider about how often you should have regular mammograms. Talk with your health care provider about your test results, treatment options, and if necessary, the need for more tests. Vaccines  Your health care provider may recommend certain vaccines, such as: Influenza vaccine. This is recommended every year. Tetanus, diphtheria, and acellular pertussis (Tdap, Td) vaccine. You may need a Td booster every 10 years. Zoster vaccine. You may need this after age 81. Pneumococcal 13-valent conjugate (PCV13) vaccine. One dose is recommended after age 67. Pneumococcal polysaccharide (PPSV23) vaccine. One dose is recommended after age 84. Talk to your health care provider about which screenings and vaccines you need and how often you need them. This information is not intended to replace advice given to you by  your health care provider. Make sure you discuss any questions you have with your health care provider. Document Released: 12/14/2015 Document Revised: 08/06/2016 Document Reviewed: 09/18/2015 Elsevier Interactive Patient Education  2017 [place] Prevention in the Home Falls can cause injuries. They can happen to people of all ages. There are many things you can do to make your home safe and to help prevent falls. What can I do on the outside of my home? Regularly fix the edges of walkways and driveways and fix any cracks. Remove anything that might make you trip as you walk through a door, such as a raised step or threshold. Trim any bushes or trees on the path to your home. Use bright outdoor lighting. Clear any walking paths of anything that might make someone trip, such as rocks or tools. Regularly check to see if handrails are loose or broken. Make sure that both sides of any steps have handrails. Any raised decks and porches  should have guardrails on the edges. Have any leaves, snow, or ice cleared regularly. Use sand or salt on walking paths during winter. Clean up any spills in your garage right away. This includes oil or grease spills. What can I do in the bathroom? Use night lights. Install grab bars by the toilet and in the tub and shower. Do not use towel bars as grab bars. Use non-skid mats or decals in the tub or shower. If you need to sit down in the shower, use a plastic, non-slip stool. Keep the floor dry. Clean up any water that spills on the floor as soon as it happens. Remove soap buildup in the tub or shower regularly. Attach bath mats securely with double-sided non-slip rug tape. Do not have throw rugs and other things on the floor that can make you trip. What can I do in the bedroom? Use night lights. Make sure that you have a light by your bed that is easy to reach. Do not use any sheets or blankets that are too big for your bed. They should not hang  down onto the floor. Have a firm chair that has side arms. You can use this for support while you get dressed. Do not have throw rugs and other things on the floor that can make you trip. What can I do in the kitchen? Clean up any spills right away. Avoid walking on wet floors. Keep items that you use a lot in easy-to-reach places. If you need to reach something above you, use a strong step stool that has a grab bar. Keep electrical cords out of the way. Do not use floor polish or wax that makes floors slippery. If you must use wax, use non-skid floor wax. Do not have throw rugs and other things on the floor that can make you trip. What can I do with my stairs? Do not leave any items on the stairs. Make sure that there are handrails on both sides of the stairs and use them. Fix handrails that are broken or loose. Make sure that handrails are as long as the stairways. Check any carpeting to make sure that it is firmly attached to the stairs. Fix any carpet that is loose or worn. Avoid having throw rugs at the top or bottom of the stairs. If you do have throw rugs, attach them to the floor with carpet tape. Make sure that you have a light switch at the top of the stairs and the bottom of the stairs. If you do not have them, ask someone to add them for you. What else can I do to help prevent falls? Wear shoes that: Do not have high heels. Have rubber bottoms. Are comfortable and fit you well. Are closed at the toe. Do not wear sandals. If you use a stepladder: Make sure that it is fully opened. Do not climb a closed stepladder. Make sure that both sides of the stepladder are locked into place. Ask someone to hold it for you, if possible. Clearly mark and make sure that you can see: Any grab bars or handrails. First and last steps. Where the edge of each step is. Use tools that help you move around (mobility aids) if they are needed. These  include: Canes. Walkers. Scooters. Crutches. Turn on the lights when you go into a dark area. Replace any light bulbs as soon as they burn out. Set up your furniture so you have a clear path. Avoid moving your furniture around. If any  of your floors are uneven, fix them. If there are any pets around you, be aware of where they are. Review your medicines with your doctor. Some medicines can make you feel dizzy. This can increase your chance of falling. Ask your doctor what other things that you can do to help prevent falls. This information is not intended to replace advice given to you by your health care provider. Make sure you discuss any questions you have with your health care provider. Document Released: 09/13/2009 Document Revised: 04/24/2016 Document Reviewed: 12/22/2014 Elsevier Interactive Patient Education  2017 [employer].

## 2023-02-18 DIAGNOSIS — H401111 Primary open-angle glaucoma, right eye, mild stage: Secondary | ICD-10-CM | POA: Diagnosis not present

## 2023-02-23 DIAGNOSIS — Z01419 Encounter for gynecological examination (general) (routine) without abnormal findings: Secondary | ICD-10-CM | POA: Diagnosis not present

## 2023-02-23 DIAGNOSIS — Z6826 Body mass index (BMI) 26.0-26.9, adult: Secondary | ICD-10-CM | POA: Diagnosis not present

## 2023-03-02 DIAGNOSIS — D649 Anemia, unspecified: Secondary | ICD-10-CM | POA: Diagnosis not present

## 2023-03-02 DIAGNOSIS — N1832 Chronic kidney disease, stage 3b: Secondary | ICD-10-CM | POA: Diagnosis not present

## 2023-03-02 DIAGNOSIS — I129 Hypertensive chronic kidney disease with stage 1 through stage 4 chronic kidney disease, or unspecified chronic kidney disease: Secondary | ICD-10-CM | POA: Diagnosis not present

## 2023-03-02 DIAGNOSIS — E1122 Type 2 diabetes mellitus with diabetic chronic kidney disease: Secondary | ICD-10-CM | POA: Diagnosis not present

## 2023-03-03 ENCOUNTER — Other Ambulatory Visit: Payer: Self-pay | Admitting: Internal Medicine

## 2023-03-03 DIAGNOSIS — E785 Hyperlipidemia, unspecified: Secondary | ICD-10-CM

## 2023-03-03 LAB — LAB REPORT - SCANNED
Creatinine, POC: 35.3 mg/dL
EGFR: 34
Protein/Creatinine Ratio: 941

## 2023-03-10 LAB — HM DIABETES EYE EXAM

## 2023-03-17 DIAGNOSIS — H401131 Primary open-angle glaucoma, bilateral, mild stage: Secondary | ICD-10-CM | POA: Diagnosis not present

## 2023-03-26 DIAGNOSIS — H401131 Primary open-angle glaucoma, bilateral, mild stage: Secondary | ICD-10-CM | POA: Diagnosis not present

## 2023-03-27 ENCOUNTER — Other Ambulatory Visit: Payer: Self-pay | Admitting: Internal Medicine

## 2023-03-27 DIAGNOSIS — I10 Essential (primary) hypertension: Secondary | ICD-10-CM

## 2023-04-08 ENCOUNTER — Other Ambulatory Visit: Payer: Self-pay | Admitting: Internal Medicine

## 2023-04-08 DIAGNOSIS — F409 Phobic anxiety disorder, unspecified: Secondary | ICD-10-CM

## 2023-04-21 ENCOUNTER — Other Ambulatory Visit: Payer: Self-pay | Admitting: Internal Medicine

## 2023-04-21 DIAGNOSIS — I10 Essential (primary) hypertension: Secondary | ICD-10-CM

## 2023-04-28 ENCOUNTER — Telehealth: Payer: Self-pay | Admitting: Internal Medicine

## 2023-04-28 DIAGNOSIS — I10 Essential (primary) hypertension: Secondary | ICD-10-CM

## 2023-04-28 MED ORDER — NEBIVOLOL HCL 20 MG PO TABS
ORAL_TABLET | ORAL | 0 refills | Status: DC
Start: 2023-04-28 — End: 2023-10-15

## 2023-04-28 NOTE — Telephone Encounter (Signed)
Prescription Request  04/28/2023  LOV: 12/04/2022  What is the name of the medication or equipment? Nebivolol HCl 20 MG TABS   Have you contacted your pharmacy to request a refill? No   Which pharmacy would you like this sent to?  [id number] - [place], [place] - [address] AT [facility] OF [address] [place] [address] Phone: [phone] Fax: [phone]    Patient notified that their request is being sent to the clinical staff for review and that they should receive a response within 2 business days.   Please advise at Mobile [phone] (mobile)

## 2023-05-14 DIAGNOSIS — Z1231 Encounter for screening mammogram for malignant neoplasm of breast: Secondary | ICD-10-CM | POA: Diagnosis not present

## 2023-05-14 DIAGNOSIS — M8588 Other specified disorders of bone density and structure, other site: Secondary | ICD-10-CM | POA: Diagnosis not present

## 2023-06-11 DIAGNOSIS — N1832 Chronic kidney disease, stage 3b: Secondary | ICD-10-CM | POA: Diagnosis not present

## 2023-06-11 DIAGNOSIS — G4700 Insomnia, unspecified: Secondary | ICD-10-CM | POA: Diagnosis not present

## 2023-06-11 DIAGNOSIS — E782 Mixed hyperlipidemia: Secondary | ICD-10-CM | POA: Diagnosis not present

## 2023-06-11 DIAGNOSIS — M1A9XX0 Chronic gout, unspecified, without tophus (tophi): Secondary | ICD-10-CM | POA: Diagnosis not present

## 2023-06-18 ENCOUNTER — Ambulatory Visit (INDEPENDENT_AMBULATORY_CARE_PROVIDER_SITE_OTHER): Payer: Medicare Other | Admitting: Internal Medicine

## 2023-06-18 ENCOUNTER — Encounter: Payer: Self-pay | Admitting: Internal Medicine

## 2023-06-18 VITALS — BP 128/68 | HR 64 | Temp 98.6°F | Resp 16 | Ht 62.0 in | Wt 145.0 lb

## 2023-06-18 DIAGNOSIS — M1A39X0 Chronic gout due to renal impairment, multiple sites, without tophus (tophi): Secondary | ICD-10-CM

## 2023-06-18 DIAGNOSIS — N183 Chronic kidney disease, stage 3 unspecified: Secondary | ICD-10-CM | POA: Diagnosis not present

## 2023-06-18 DIAGNOSIS — R7989 Other specified abnormal findings of blood chemistry: Secondary | ICD-10-CM

## 2023-06-18 DIAGNOSIS — R7303 Prediabetes: Secondary | ICD-10-CM

## 2023-06-18 DIAGNOSIS — M79605 Pain in left leg: Secondary | ICD-10-CM | POA: Insufficient documentation

## 2023-06-18 DIAGNOSIS — E790 Hyperuricemia without signs of inflammatory arthritis and tophaceous disease: Secondary | ICD-10-CM

## 2023-06-18 DIAGNOSIS — L309 Dermatitis, unspecified: Secondary | ICD-10-CM

## 2023-06-18 DIAGNOSIS — I10 Essential (primary) hypertension: Secondary | ICD-10-CM | POA: Diagnosis not present

## 2023-06-18 DIAGNOSIS — N2889 Other specified disorders of kidney and ureter: Secondary | ICD-10-CM

## 2023-06-18 LAB — BASIC METABOLIC PANEL
BUN: 44 mg/dL — ABNORMAL HIGH (ref 6–23)
CO2: 22 mEq/L (ref 19–32)
Calcium: 10 mg/dL (ref 8.4–10.5)
Chloride: 110 mEq/L (ref 96–112)
Creatinine, Ser: 1.56 mg/dL — ABNORMAL HIGH (ref 0.40–1.20)
GFR: 31.03 mL/min — ABNORMAL LOW (ref >60.00)
Glucose, Bld: 92 mg/dL (ref 70–99)
Potassium: 4.4 mEq/L (ref 3.5–5.1)
Sodium: 142 mEq/L (ref 135–145)

## 2023-06-18 LAB — D-DIMER, QUANTITATIVE: D-Dimer, Quant: 2.11 mcg/mL FEU — ABNORMAL HIGH (ref <0.50)

## 2023-06-18 LAB — HEMOGLOBIN A1C: Hgb A1c MFr Bld: 5.8 % (ref 4.6–6.5)

## 2023-06-18 LAB — URIC ACID: Uric Acid, Serum: 5 mg/dL (ref 2.4–7.0)

## 2023-06-18 MED ORDER — TRIAMCINOLONE ACETONIDE 0.5 % EX CREA
1.0000 | TOPICAL_CREAM | Freq: Three times a day (TID) | CUTANEOUS | 2 refills | Status: DC
Start: 2023-06-18 — End: 2023-11-05

## 2023-06-18 NOTE — Patient Instructions (Signed)
Chronic Kidney Disease, Adult Chronic kidney disease (CKD) occurs when the kidneys are slowly and permanently damaged over a long period of time. The kidneys are a pair of organs that do many important jobs in the body, including: Removing waste and extra fluid from the blood to make urine. Making hormones that maintain the amount of fluid in tissues and blood vessels. Maintaining the right amount of fluids and chemicals in the body. A small amount of kidney damage may not cause problems, but a large amount of damage may make it hard or impossible for the kidneys to work right. Steps must be taken to slow kidney damage or to stop it from getting worse. If steps are not taken, the kidneys may stop working permanently (end-stage renal disease, or ESRD). Most of the time, CKD does not go away, but it can often be controlled. People who have CKD are usually able to live full lives. What are the causes? The most common causes of this condition are diabetes and high blood pressure (hypertension). Other causes include: Cardiovascular diseases. These affect the heart and blood vessels. Kidney diseases. These include: Glomerulonephritis, or inflammation of the tiny filters in the kidneys. Interstitial nephritis. This is swelling of the small tubes of the kidneys and of the surrounding structures. Polycystic kidney disease, in which clusters of fluid-filled sacs form within the kidneys. Renal vascular disease. This includes disorders that affect the arteries and veins of the kidneys. Diseases that affect the body's defense system (immune system). A problem with urine flow. This may be caused by: Kidney stones. Cancer. An enlarged prostate, in males. A kidney infection or urinary tract infection (UTI) that keeps coming back. Vasculitis. This is swelling or inflammation of the blood vessels. What increases the risk? Your chances of having kidney disease increase with age. The following factors may make  you more likely to develop this condition: A family history of kidney disease or kidney failure. Kidney failure means the kidneys can no longer work right. Certain genetic diseases. Taking medicines often that are damaging to the kidneys. Being around or being in contact with toxic substances. Obesity. A history of tobacco use. What are the signs or symptoms? Symptoms of this condition include: Feeling very tired (lethargic) and having less energy. Swelling, or edema, of the face, legs, ankles, or feet. Nausea or vomiting, or loss of appetite. Confusion or trouble concentrating. Muscle twitches and cramps, especially in the legs. Dry, itchy skin. A metallic taste in the mouth. Producing less urine, or producing more urine (especially at night). Shortness of breath. Trouble sleeping. CKD may also result in not having enough red blood cells or hemoglobin in the blood (anemia) or having weak bones (bone disease). Symptoms develop slowly and may not be obvious until the kidney damage becomes severe. It is possible to have kidney disease for years without having symptoms. How is this diagnosed? This condition may be diagnosed based on: Blood tests. Urine tests. Imaging tests, such as an ultrasound or a CT scan. A kidney biopsy. This involves removing a sample of kidney tissue to be looked at under a microscope. Results from these tests will help to determine how serious the CKD is. How is this treated? There is no cure for most cases of this condition, but treatment usually relieves symptoms and prevents or slows the worsening of the disease. Treatment may include: Diet changes, which may require you to avoid alcohol and foods that are high in salt, potassium, phosphorous, and protein. Medicines. These may:  Lower blood pressure. Control blood sugar (glucose). Relieve anemia. Relieve swelling. Protect your bones. Improve the balance of salts and minerals in your blood  (electrolytes). Dialysis, which is a type of treatment that removes toxic waste from the body. It may be needed if you have kidney failure. Managing any other conditions that are causing your CKD or making it worse. Follow these instructions at home: Medicines Take over-the-counter and prescription medicines only as told by your health care provider. The amount of some medicines that you take may need to be changed. Do not take any new medicines unless approved by your health care provider. Many medicines can make kidney damage worse. Do not take any vitamin and mineral supplements unless approved by your health care provider. Many nutritional supplements can make kidney damage worse. Lifestyle  Do not use any products that contain nicotine or tobacco, such as cigarettes, e-cigarettes, and chewing tobacco. If you need help quitting, ask your health care provider. If you drink alcohol: Limit how much you use to: 0-1 drink a day for women who are not pregnant. 0-2 drinks a day for men. Know how much alcohol is in your drink. In the [place] one drink equals one 12 oz bottle of beer (355 mL), one 5 oz glass of wine (148 mL), or one 1 oz glass of hard liquor (44 mL). Maintain a healthy weight. If you need help, ask your health care provider. General instructions  Follow instructions from your health care provider about eating or drinking restrictions, including any prescribed diet. Track your blood pressure at home. Report changes in your blood pressure as told. If you are being treated for diabetes, track your blood glucose levels as told. Start or continue an exercise plan. Exercise at least 30 minutes a day, 5 days a week. Keep your immunizations up to date as told. Keep all follow-up visits. This is important. Where to find more information American Association of Kidney Patients: ResidentialShow.is [employer]: www.kidney.org American Kidney Fund: FightingMatch.com.ee Life Options:  www.lifeoptions.org Kidney School: www.kidneyschool.org Contact a health care provider if: Your symptoms get worse. You develop new symptoms. Get help right away if: You develop symptoms of ESRD. These include: Headaches. Numbness in your hands or feet. Easy bruising. Frequent hiccups. Chest pain. Shortness of breath. Lack of menstrual periods, in women. You have a fever. You are producing less urine than usual. You have pain or bleeding when you urinate or when you have a bowel movement. These symptoms may represent a serious problem that is an emergency. Do not wait to see if the symptoms will go away. Get medical help right away. Call your local emergency services (911 in the [place] Do not drive yourself to the hospital. Summary Chronic kidney disease (CKD) occurs when the kidneys become damaged slowly over a long period of time. The most common causes of this condition are diabetes and high blood pressure (hypertension). There is no cure for most cases of CKD, but treatment usually relieves symptoms and prevents or slows the worsening of the disease. Treatment may include a combination of lifestyle changes, medicines, and dialysis. This information is not intended to replace advice given to you by your health care provider. Make sure you discuss any questions you have with your health care provider. Document Revised: 02/22/2020 Document Reviewed: 02/22/2020 Elsevier Patient Education  2024 [employer].

## 2023-06-18 NOTE — Progress Notes (Signed)
Subjective:  Patient ID: Sarah [name], female    DOB: 1941/04/16  Age: 82 y.o. MRN: [id number]  CC: Hypertension and Rash   HPI Sarah [name] presents for f/up ---  Discussed the use of AI scribe software for clinical note transcription with the patient, who gave verbal consent to proceed.  History of Present Illness   The patient reports intermittent pain in their left leg, which has a history of a blood clot. The pain is located in the calf and is not consistently associated with walking. They also note occasional swelling in their ankles.  The patient is under the care of a nephrologist and is due for a follow-up visit later in the month. They express confusion about a potential diabetes diagnosis, with conflicting information from their healthcare providers.  The patient denies experiencing chest pain or shortness of breath. They report occasional coughing related to sinus mucus drainage.       Outpatient Medications Prior to Visit  Medication Sig Dispense Refill   acetaminophen (TYLENOL) 500 MG tablet Take 500 mg by mouth every 6 (six) hours as needed.     allopurinol (ZYLOPRIM) 100 MG tablet TAKE 2 TABLETS(200 MG) BY MOUTH DAILY 180 tablet 1   atorvastatin (LIPITOR) 10 MG tablet TAKE 1 TABLET(10 MG) BY MOUTH DAILY 90 tablet 1   brimonidine (ALPHAGAN) 0.2 % ophthalmic solution 1 drop 3 (three) times daily.     diltiazem (TIAZAC) 300 MG 24 hr capsule TAKE ONE CAPSULE BY MOUTH EVERY DAY BEFORE BREAKFAST 90 capsule 0   dorzolamide-timolol (COSOPT) 2-0.5 % ophthalmic solution PLACE 1 DROP INTO BOTH EYES IN THE MORNING, AT NOON, AND AT BEDTIME 10 mL 3   hydrALAZINE (APRESOLINE) 50 MG tablet TAKE 1 TABLET(50 MG) BY MOUTH THREE TIMES DAILY 270 tablet 1   levocetirizine (XYZAL) 5 MG tablet TAKE 1 TABLET(5 MG) BY MOUTH EVERY EVENING 90 tablet 1   Nebivolol HCl 20 MG TABS TAKE 1 TABLET(20 MG) BY MOUTH DAILY 90 tablet 0   Netarsudil-Latanoprost (ROCKLATAN) 0.02-0.005 % SOLN Apply to  eye.     SYMBICORT 160-4.5 MCG/ACT inhaler INHALE 2 PUFFS INTO THE LUNGS TWICE DAILY 30.6 g 5   zolpidem (AMBIEN) 5 MG tablet TAKE 1 TABLET(5 MG) BY MOUTH AT BEDTIME AS NEEDED FOR SLEEP 90 tablet 0   fluticasone (FLONASE) 50 MCG/ACT nasal spray Place 2 sprays into both nostrils daily. 48 g 1   No facility-administered medications prior to visit.    ROS Review of Systems  Constitutional: Negative.  Negative for diaphoresis and fatigue.  HENT: Negative.    Eyes:  Negative for visual disturbance.  Respiratory:  Negative for cough, chest tightness, shortness of breath and wheezing.   Cardiovascular:  Negative for chest pain, palpitations and leg swelling.  Gastrointestinal:  Negative for abdominal pain, constipation, diarrhea, nausea and vomiting.  Genitourinary: Negative.   Musculoskeletal:  Positive for arthralgias. Negative for joint swelling and myalgias.  Skin:  Positive for rash. Negative for color change.       Itchy rash on both feet not better with vaseline  Neurological: Negative.  Negative for dizziness and weakness.  Hematological:  Negative for adenopathy. Does not bruise/bleed easily.  Psychiatric/Behavioral: Negative.      Objective:  BP 128/68 (BP Location: Left Arm, Patient Position: Sitting, Cuff Size: Large)   Pulse 64   Temp 98.6 F (37 C) (Oral)   Resp 16   Ht 5\' 2"  (1.575 m)   Wt 145 lb (  65.8 kg)   SpO2 96%   BMI 26.52 kg/m   BP Readings from Last 3 Encounters:  06/18/23 128/68  12/04/22 136/72  09/03/22 (!) 162/60    Wt Readings from Last 3 Encounters:  06/18/23 145 lb (65.8 kg)  02/10/23 144 lb (65.3 kg)  12/04/22 146 lb (66.2 kg)    Physical Exam Vitals reviewed.  Constitutional:      Appearance: She is not ill-appearing.  HENT:     Nose: Nose normal.     Mouth/Throat:     Mouth: Mucous membranes are moist.  Eyes:     General: No scleral icterus.    Conjunctiva/sclera: Conjunctivae normal.  Cardiovascular:     Rate and Rhythm: Normal  rate and regular rhythm.     Heart sounds: No murmur heard.    No gallop.  Pulmonary:     Breath sounds: No stridor. No wheezing, rhonchi or rales.  Abdominal:     General: Abdomen is flat.     Palpations: There is no mass.     Tenderness: There is no abdominal tenderness. There is no guarding.     Hernia: No hernia is present.  Musculoskeletal:        General: Normal range of motion.     Cervical back: Neck supple.     Right lower leg: No edema.     Left lower leg: No edema.  Lymphadenopathy:     Cervical: No cervical adenopathy.  Skin:    General: Skin is warm and dry.  Neurological:     General: No focal deficit present.     Mental Status: She is alert. Mental status is at baseline.  Psychiatric:        Mood and Affect: Mood normal.        Behavior: Behavior normal.     Lab Results  Component Value Date   WBC 4.3 09/03/2022   HGB 10.6 (L) 09/03/2022   HCT 32.1 (L) 09/03/2022   PLT 210.0 09/03/2022   GLUCOSE 92 06/18/2023   CHOL 142 09/03/2022   TRIG 57.0 09/03/2022   HDL 67.50 09/03/2022   LDLCALC 63 09/03/2022   ALT 14 09/03/2022   AST 19 09/03/2022   NA 142 06/18/2023   K 4.4 06/18/2023   CL 110 06/18/2023   CREATININE 1.56 (H) 06/18/2023   BUN 44 (H) 06/18/2023   CO2 22 06/18/2023   TSH 2.36 09/03/2022   HGBA1C 5.8 06/18/2023   MICROALBUR 0.8 09/21/2018    US RENAL  Result Date: 11/11/2018 CLINICAL DATA:  Stage III chronic kidney disease, hypertension, diabetes mellitus EXAM: RENAL / URINARY TRACT ULTRASOUND COMPLETE COMPARISON:  None FINDINGS: Right Kidney: Renal measurements: 7.8 x 4.0 x 4.0 cm = volume: 65.8 mL. Cortical thinning. Increased cortical echogenicity. No mass, hydronephrosis or shadowing calcification Left Kidney: Renal measurements: 8.9 x 4.5 x 4.5 cm = volume: 92.1 mL. Cortical thinning. Increased cortical echogenicity. Simple appearing cyst at LEFT kidney 4.4 x 4.1 x 4.5 cm. No additional mass or hydronephrosis. No shadowing  calcification. Bladder: Appears normal for degree of bladder distention. 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 Simple appearing LEFT renal cyst 4.5 cm diameter. Electronically Signed   By: Ulyses Southward M.D.   On: 11/11/2018 09:28    Assessment & Plan:   CRI (chronic renal insufficiency), stage 3 (moderate) - Her renal function is stable. -     Basic metabolic panel; Future  Essential hypertension - BP is well controlled. -  Basic metabolic panel; Future  Chronic gout due to renal impairment of multiple sites without tophus -     Uric acid; Future  Hyperuricemia -     Uric acid; Future  Left leg pain -     D-dimer, quantitative; Future -     VAS [place] LOWER EXTREMITY VENOUS (DVT); Future  Prediabetes -     Hemoglobin A1c; Future -     HM Diabetes Foot Exam  D-dimer, elevated- Will evaluate for DVT. -     VAS [place] LOWER EXTREMITY VENOUS (DVT); Future  Chronic eczema of foot -     Triamcinolone Acetonide; Apply 1 Application topically 3 (three) times daily.  Dispense: 90 g; Refill: 2     Follow-up: Return in about 6 months (around 12/19/2023).  Sanda Linger, MD

## 2023-06-25 ENCOUNTER — Ambulatory Visit (HOSPITAL_COMMUNITY)
Admission: RE | Admit: 2023-06-25 | Discharge: 2023-06-25 | Disposition: A | Discharge status: Home / Self Care | Payer: Medicare Other | Source: Ambulatory Visit | Attending: Vascular Surgery | Admitting: Vascular Surgery

## 2023-06-25 DIAGNOSIS — M79605 Pain in left leg: Secondary | ICD-10-CM | POA: Diagnosis not present

## 2023-06-25 DIAGNOSIS — R7989 Other specified abnormal findings of blood chemistry: Secondary | ICD-10-CM

## 2023-06-30 ENCOUNTER — Other Ambulatory Visit: Payer: Self-pay | Admitting: Internal Medicine

## 2023-06-30 DIAGNOSIS — I10 Essential (primary) hypertension: Secondary | ICD-10-CM

## 2023-07-08 DIAGNOSIS — N1832 Chronic kidney disease, stage 3b: Secondary | ICD-10-CM | POA: Diagnosis not present

## 2023-07-08 DIAGNOSIS — D649 Anemia, unspecified: Secondary | ICD-10-CM | POA: Diagnosis not present

## 2023-07-08 DIAGNOSIS — I129 Hypertensive chronic kidney disease with stage 1 through stage 4 chronic kidney disease, or unspecified chronic kidney disease: Secondary | ICD-10-CM | POA: Diagnosis not present

## 2023-07-08 DIAGNOSIS — E1122 Type 2 diabetes mellitus with diabetic chronic kidney disease: Secondary | ICD-10-CM | POA: Diagnosis not present

## 2023-07-09 LAB — LAB REPORT - SCANNED
Creatinine, POC: 35.1 mg/dL
EGFR: 33

## 2023-07-10 LAB — BASIC METABOLIC PANEL
BUN: 37 — AB (ref 4–21)
CO2: 19 (ref 13–22)
Chloride: 110 — AB (ref 99–108)
Creatinine: 1.6 — AB (ref 0.5–1.1)
Glucose: 81
Potassium: 4.9 meq/L (ref 3.5–5.1)
Sodium: 144 (ref 137–147)

## 2023-07-10 LAB — COMPREHENSIVE METABOLIC PANEL
Albumin: 4.5 (ref 3.5–5.0)
Calcium: 9.7 (ref 8.7–10.7)
eGFR: 33

## 2023-07-10 LAB — MICROALBUMIN / CREATININE URINE RATIO: Microalb Creat Ratio: 350

## 2023-07-10 LAB — PROTEIN / CREATININE RATIO, URINE: Creatinine, Urine: 35.1

## 2023-07-10 LAB — CBC AND DIFFERENTIAL: Hemoglobin: 10.8 — AB (ref 12.0–16.0)

## 2023-07-19 ENCOUNTER — Other Ambulatory Visit: Payer: Self-pay | Admitting: Internal Medicine

## 2023-07-19 DIAGNOSIS — E785 Hyperlipidemia, unspecified: Secondary | ICD-10-CM

## 2023-07-19 DIAGNOSIS — I10 Essential (primary) hypertension: Secondary | ICD-10-CM

## 2023-07-20 ENCOUNTER — Other Ambulatory Visit: Payer: Self-pay | Admitting: Internal Medicine

## 2023-07-20 DIAGNOSIS — F409 Phobic anxiety disorder, unspecified: Secondary | ICD-10-CM

## 2023-09-10 DIAGNOSIS — H35031 Hypertensive retinopathy, right eye: Secondary | ICD-10-CM | POA: Diagnosis not present

## 2023-09-10 DIAGNOSIS — H59033 Cystoid macular edema following cataract surgery, bilateral: Secondary | ICD-10-CM | POA: Diagnosis not present

## 2023-09-10 DIAGNOSIS — H401131 Primary open-angle glaucoma, bilateral, mild stage: Secondary | ICD-10-CM | POA: Diagnosis not present

## 2023-10-01 ENCOUNTER — Other Ambulatory Visit: Payer: Self-pay | Admitting: Internal Medicine

## 2023-10-01 DIAGNOSIS — I10 Essential (primary) hypertension: Secondary | ICD-10-CM

## 2023-10-14 ENCOUNTER — Other Ambulatory Visit: Payer: Self-pay | Admitting: Internal Medicine

## 2023-10-14 DIAGNOSIS — E785 Hyperlipidemia, unspecified: Secondary | ICD-10-CM

## 2023-10-14 DIAGNOSIS — I10 Essential (primary) hypertension: Secondary | ICD-10-CM

## 2023-10-15 ENCOUNTER — Other Ambulatory Visit: Payer: Self-pay | Admitting: Internal Medicine

## 2023-10-15 DIAGNOSIS — I10 Essential (primary) hypertension: Secondary | ICD-10-CM

## 2023-10-16 ENCOUNTER — Other Ambulatory Visit: Payer: Self-pay | Admitting: Internal Medicine

## 2023-10-16 DIAGNOSIS — E790 Hyperuricemia without signs of inflammatory arthritis and tophaceous disease: Secondary | ICD-10-CM

## 2023-10-16 DIAGNOSIS — M1A39X0 Chronic gout due to renal impairment, multiple sites, without tophus (tophi): Secondary | ICD-10-CM

## 2023-10-22 ENCOUNTER — Other Ambulatory Visit: Payer: Self-pay | Admitting: Internal Medicine

## 2023-10-22 DIAGNOSIS — J4540 Moderate persistent asthma, uncomplicated: Secondary | ICD-10-CM

## 2023-11-02 DIAGNOSIS — N1832 Chronic kidney disease, stage 3b: Secondary | ICD-10-CM | POA: Diagnosis not present

## 2023-11-05 ENCOUNTER — Ambulatory Visit: Payer: Medicare Other | Admitting: Internal Medicine

## 2023-11-05 ENCOUNTER — Encounter: Payer: Self-pay | Admitting: Internal Medicine

## 2023-11-05 VITALS — BP 140/68 | HR 60 | Temp 98.1°F | Resp 16 | Ht 62.0 in | Wt 146.8 lb

## 2023-11-05 DIAGNOSIS — E785 Hyperlipidemia, unspecified: Secondary | ICD-10-CM

## 2023-11-05 DIAGNOSIS — Z0001 Encounter for general adult medical examination with abnormal findings: Secondary | ICD-10-CM | POA: Diagnosis not present

## 2023-11-05 DIAGNOSIS — M1A39X0 Chronic gout due to renal impairment, multiple sites, without tophus (tophi): Secondary | ICD-10-CM

## 2023-11-05 DIAGNOSIS — Z23 Encounter for immunization: Secondary | ICD-10-CM

## 2023-11-05 DIAGNOSIS — I10 Essential (primary) hypertension: Secondary | ICD-10-CM | POA: Diagnosis not present

## 2023-11-05 DIAGNOSIS — L309 Dermatitis, unspecified: Secondary | ICD-10-CM | POA: Diagnosis not present

## 2023-11-05 DIAGNOSIS — E790 Hyperuricemia without signs of inflammatory arthritis and tophaceous disease: Secondary | ICD-10-CM | POA: Diagnosis not present

## 2023-11-05 LAB — LIPID PANEL
Cholesterol: 131 mg/dL (ref 0–200)
HDL: 62.7 mg/dL (ref >39.00)
LDL Cholesterol: 58 mg/dL (ref 0–99)
NonHDL: 68.09
Total CHOL/HDL Ratio: 2
Triglycerides: 48 mg/dL (ref 0.0–149.0)
VLDL: 9.6 mg/dL (ref 0.0–40.0)

## 2023-11-05 LAB — HEPATIC FUNCTION PANEL
ALT: 19 U/L (ref 0–35)
AST: 20 U/L (ref 0–37)
Albumin: 4.4 g/dL (ref 3.5–5.2)
Alkaline Phosphatase: 89 U/L (ref 39–117)
Bilirubin, Direct: 0 mg/dL (ref 0.0–0.3)
Total Bilirubin: 0.4 mg/dL (ref 0.2–1.2)
Total Protein: 7.5 g/dL (ref 6.0–8.3)

## 2023-11-05 LAB — TSH: TSH: 1.94 u[IU]/mL (ref 0.35–5.50)

## 2023-11-05 LAB — URIC ACID: Uric Acid, Serum: 4.9 mg/dL (ref 2.4–7.0)

## 2023-11-05 MED ORDER — CLOBETASOL PROPIONATE 0.05 % EX CREA: 1.0000 | TOPICAL_CREAM | Freq: Two times a day (BID) | CUTANEOUS | 2 refills | Status: AC

## 2023-11-05 NOTE — Progress Notes (Unsigned)
Subjective:  Patient ID: Sarah [name], female    DOB: January 21, 1941  Age: 82 y.o. MRN: [id number]  CC: Hypertension, Annual Exam, and Back Pain   HPI Sarah [name] presents for a CPX and f/up ---  Discussed the use of AI scribe software for clinical note transcription with the patient, who gave verbal consent to proceed.  History of Present Illness   The patient, with a history of hypertension, gout, and back pain, presents with complaints for f/up. She denies cough, chest pain, shortness of breath, and dizziness. She reports high blood pressure but denies any associated symptoms or peripheral edema.  The patient has been experiencing difficulty walking due to back pain and gout. The back pain radiates into her legs and feet. For pain management, she has been using over-the-counter Tylenol and rest.   The patient has been under the care of a nephrologist for kidney disease, with stable kidney function at 30%. She expresses a strong aversion to the prospect of dialysis.  The patient also mentions a musculoskeletal issue, specifically a 'bad shoulder,' but does not elaborate on the symptoms or treatment. She has been using a topical cream for a foot condition, which she reports is more effective than a previously prescribed medication.       Outpatient Medications Prior to Visit  Medication Sig Dispense Refill   acetaminophen (TYLENOL) 500 MG tablet Take 500 mg by mouth every 6 (six) hours as needed.     allopurinol (ZYLOPRIM) 100 MG tablet TAKE 2 TABLETS BY MOUTH DAILY 90 tablet 0   atorvastatin (LIPITOR) 10 MG tablet TAKE 1 TABLET(10 MG) BY MOUTH DAILY 90 tablet 0   brimonidine (ALPHAGAN) 0.2 % ophthalmic solution 1 drop 3 (three) times daily.     budesonide-formoterol (SYMBICORT) 160-4.5 MCG/ACT inhaler INHALE 2 PUFFS INTO THE LUNGS TWICE DAILY 30.6 g 0   diltiazem (TIAZAC) 300 MG 24 hr capsule TAKE 1 CAPSULE BY MOUTH EVERY DAY BEFORE BREAKFAST 90 capsule 0   dorzolamide-timolol  (COSOPT) 2-0.5 % ophthalmic solution PLACE 1 DROP INTO BOTH EYES IN THE MORNING, AT NOON, AND AT BEDTIME 10 mL 3   hydrALAZINE (APRESOLINE) 50 MG tablet TAKE 1 TABLET(50 MG) BY MOUTH THREE TIMES DAILY 270 tablet 0   levocetirizine (XYZAL) 5 MG tablet TAKE 1 TABLET(5 MG) BY MOUTH EVERY EVENING 90 tablet 1   Nebivolol HCl 20 MG TABS TAKE 1 TABLET(20 MG) BY MOUTH DAILY 90 tablet 0   Netarsudil-Latanoprost (ROCKLATAN) 0.02-0.005 % SOLN Apply to eye.     zolpidem (AMBIEN) 5 MG tablet TAKE 1 TABLET(5 MG) BY MOUTH AT BEDTIME AS NEEDED FOR SLEEP 90 tablet 0   triamcinolone cream (KENALOG) 0.5 % Apply 1 Application topically 3 (three) times daily. 90 g 2   No facility-administered medications prior to visit.    ROS Review of Systems  Objective:  BP (!) 140/68 (BP Location: Left Arm, Patient Position: Sitting, Cuff Size: Normal)   Pulse 60   Temp 98.1 F (36.7 C) (Oral)   Resp 16   Ht 5\' 2"  (1.575 m)   Wt 146 lb 12.8 oz (66.6 kg)   SpO2 99%   BMI 26.85 kg/m   BP Readings from Last 3 Encounters:  11/05/23 (!) 140/68  06/18/23 128/68  12/04/22 136/72    Wt Readings from Last 3 Encounters:  11/05/23 146 lb 12.8 oz (66.6 kg)  06/18/23 145 lb (65.8 kg)  02/10/23 144 lb (65.3 kg)    Physical Exam Cardiovascular:  Rate and Rhythm: Bradycardia present.     Heart sounds: Normal heart sounds and S1 normal.     Comments: EKG- SB, 55 bpm No LVH, Q waves, or ST/T waves  Musculoskeletal:     Right lower leg: No edema.     Left lower leg: No edema.     Lab Results  Component Value Date   WBC 4.3 09/03/2022   HGB 10.6 (L) 09/03/2022   HCT 32.1 (L) 09/03/2022   PLT 210.0 09/03/2022   GLUCOSE 92 06/18/2023   CHOL 131 11/05/2023   TRIG 48.0 11/05/2023   HDL 62.70 11/05/2023   LDLCALC 58 11/05/2023   ALT 19 11/05/2023   AST 20 11/05/2023   NA 142 06/18/2023   K 4.4 06/18/2023   CL 110 06/18/2023   CREATININE 1.56 (H) 06/18/2023   BUN 44 (H) 06/18/2023   CO2 22 06/18/2023    TSH 1.94 11/05/2023   HGBA1C 5.8 06/18/2023   MICROALBUR 0.8 09/21/2018    VAS [place] LOWER EXTREMITY VENOUS (DVT)  Result Date: 06/25/2023  Lower Venous DVT Study Patient Name:  Sarah [name]  Date of Exam:   06/25/2023 Medical Rec #: [id number]      Accession #:    [id number] Date of Birth: March 27, 1941      Patient Gender: F Patient Age:   81 years Exam Location:  [facility] Procedure:      VAS [place] LOWER EXTREMITY VENOUS (DVT) Referring Phys: Sanda Linger --------------------------------------------------------------------------------  Indications: Pain.  Comparison Study: no prior Performing Technologist: [place] Ponder RVS  Examination Guidelines: A complete evaluation includes B-mode imaging, spectral Doppler, color Doppler, and power Doppler as needed of all accessible portions of each vessel. Bilateral testing is considered an integral part of a complete examination. Limited examinations for reoccurring indications may be performed as noted. The reflux portion of the exam is performed with the patient in reverse Trendelenburg.  +-----+---------------+---------+-----------+----------+--------------+ RIGHTCompressibilityPhasicitySpontaneityPropertiesThrombus Aging +-----+---------------+---------+-----------+----------+--------------+ CFV  Full           Yes      Yes                                 +-----+---------------+---------+-----------+----------+--------------+   +---------+---------------+---------+-----------+----------+--------------+ LEFT     CompressibilityPhasicitySpontaneityPropertiesThrombus Aging +---------+---------------+---------+-----------+----------+--------------+ CFV      Full           Yes      Yes                                 +---------+---------------+---------+-----------+----------+--------------+ SFJ      Full                                                         +---------+---------------+---------+-----------+----------+--------------+ FV Prox  Full                                                        +---------+---------------+---------+-----------+----------+--------------+ FV Mid   Full                                                        +---------+---------------+---------+-----------+----------+--------------+  FV DistalFull                                                        +---------+---------------+---------+-----------+----------+--------------+ PFV      Full                                                        +---------+---------------+---------+-----------+----------+--------------+ POP      Full           Yes      Yes                                 +---------+---------------+---------+-----------+----------+--------------+ PTV      Full                                                        +---------+---------------+---------+-----------+----------+--------------+ PERO     Full                                                        +---------+---------------+---------+-----------+----------+--------------+     Summary: RIGHT: - No evidence of common femoral vein obstruction.  LEFT: - There is no evidence of deep vein thrombosis in the lower extremity.  - No cystic structure found in the popliteal fossa.  *See table(s) above for measurements and observations. Electronically signed by Waverly Ferrari MD on 06/25/2023 at 2:34:21 PM.    Final     Assessment & Plan:  Essential hypertension -     TSH; Future -     Hepatic function panel; Future -     EKG 12-Lead  Hyperuricemia -     Uric acid; Future  Hyperlipidemia with target LDL less than 100 -     Lipid panel; Future -     TSH; Future -     Hepatic function panel; Future  Flu vaccine need -     Flu Vaccine Trivalent High Dose (Fluad)  Chronic eczema of foot -     Clobetasol Propionate; Apply 1 Application topically 2 (two) times  daily.  Dispense: 60 g; Refill: 2  Chronic gout due to renal impairment of multiple sites without tophus -     Uric acid; Future  Encounter for general adult medical examination with abnormal findings     Follow-up: Return in about 6 months (around 05/05/2024).  Sanda Linger, MD

## 2023-11-05 NOTE — Patient Instructions (Signed)

## 2023-11-06 ENCOUNTER — Encounter: Payer: Self-pay | Admitting: Internal Medicine

## 2023-11-12 DIAGNOSIS — D649 Anemia, unspecified: Secondary | ICD-10-CM | POA: Diagnosis not present

## 2023-11-12 DIAGNOSIS — E1122 Type 2 diabetes mellitus with diabetic chronic kidney disease: Secondary | ICD-10-CM | POA: Diagnosis not present

## 2023-11-12 DIAGNOSIS — N184 Chronic kidney disease, stage 4 (severe): Secondary | ICD-10-CM | POA: Diagnosis not present

## 2023-11-12 DIAGNOSIS — I129 Hypertensive chronic kidney disease with stage 1 through stage 4 chronic kidney disease, or unspecified chronic kidney disease: Secondary | ICD-10-CM | POA: Diagnosis not present

## 2023-11-12 DIAGNOSIS — E875 Hyperkalemia: Secondary | ICD-10-CM | POA: Diagnosis not present

## 2023-12-11 DIAGNOSIS — I129 Hypertensive chronic kidney disease with stage 1 through stage 4 chronic kidney disease, or unspecified chronic kidney disease: Secondary | ICD-10-CM | POA: Diagnosis not present

## 2023-12-11 DIAGNOSIS — N1832 Chronic kidney disease, stage 3b: Secondary | ICD-10-CM | POA: Diagnosis not present

## 2023-12-14 ENCOUNTER — Other Ambulatory Visit: Payer: Self-pay | Admitting: Internal Medicine

## 2023-12-14 DIAGNOSIS — F409 Phobic anxiety disorder, unspecified: Secondary | ICD-10-CM

## 2023-12-20 ENCOUNTER — Other Ambulatory Visit: Payer: Self-pay | Admitting: Internal Medicine

## 2023-12-20 DIAGNOSIS — I10 Essential (primary) hypertension: Secondary | ICD-10-CM

## 2024-01-13 ENCOUNTER — Other Ambulatory Visit: Payer: Self-pay | Admitting: Internal Medicine

## 2024-01-13 DIAGNOSIS — I10 Essential (primary) hypertension: Secondary | ICD-10-CM

## 2024-01-13 DIAGNOSIS — E785 Hyperlipidemia, unspecified: Secondary | ICD-10-CM

## 2024-01-18 ENCOUNTER — Telehealth: Payer: Self-pay | Admitting: Internal Medicine

## 2024-01-18 NOTE — Telephone Encounter (Signed)
 Patient's daughter dropped off a form - please call her at [phone] when it is ready

## 2024-01-20 NOTE — Telephone Encounter (Signed)
 noted

## 2024-01-22 NOTE — Telephone Encounter (Signed)
 Paperwork has been completed and faxed successfully. Patient and her daughter has been made aware.

## 2024-01-26 ENCOUNTER — Other Ambulatory Visit: Payer: Self-pay | Admitting: Internal Medicine

## 2024-01-26 DIAGNOSIS — J4540 Moderate persistent asthma, uncomplicated: Secondary | ICD-10-CM

## 2024-02-01 DIAGNOSIS — N184 Chronic kidney disease, stage 4 (severe): Secondary | ICD-10-CM | POA: Diagnosis not present

## 2024-02-01 LAB — BASIC METABOLIC PANEL WITH GFR
BUN: 35 — AB (ref 4–21)
CO2: 17 (ref 13–22)
Creatinine: 1.6 — AB (ref 0.5–1.1)
Potassium: 5.4 meq/L — AB (ref 3.5–5.1)

## 2024-02-01 LAB — CBC AND DIFFERENTIAL: Hemoglobin: 11.8 — AB (ref 12.0–16.0)

## 2024-02-10 DIAGNOSIS — E875 Hyperkalemia: Secondary | ICD-10-CM | POA: Diagnosis not present

## 2024-02-10 DIAGNOSIS — N1832 Chronic kidney disease, stage 3b: Secondary | ICD-10-CM | POA: Diagnosis not present

## 2024-02-10 DIAGNOSIS — D649 Anemia, unspecified: Secondary | ICD-10-CM | POA: Diagnosis not present

## 2024-02-10 DIAGNOSIS — I129 Hypertensive chronic kidney disease with stage 1 through stage 4 chronic kidney disease, or unspecified chronic kidney disease: Secondary | ICD-10-CM | POA: Diagnosis not present

## 2024-02-10 DIAGNOSIS — E1122 Type 2 diabetes mellitus with diabetic chronic kidney disease: Secondary | ICD-10-CM | POA: Diagnosis not present

## 2024-02-11 ENCOUNTER — Ambulatory Visit (INDEPENDENT_AMBULATORY_CARE_PROVIDER_SITE_OTHER): Payer: Medicare Other

## 2024-02-11 VITALS — Ht 62.0 in | Wt 146.0 lb

## 2024-02-11 DIAGNOSIS — Z0000 Encounter for general adult medical examination without abnormal findings: Secondary | ICD-10-CM

## 2024-02-11 NOTE — Patient Instructions (Signed)
 Sarah [name] , Thank you for taking time to come for your Medicare Wellness Visit. I appreciate your ongoing commitment to your health goals. Please review the following plan we discussed and let me know if I can assist you in the future.   Referrals/Orders/Follow-Ups/Clinician Recommendations: It was nice talking to you today.  You are due for an A1C check during your next visit with Dr. Yetta Barre.  Remember to schedule a Bone density at the time of your mammogram screening.  Keep up the good work.  This is a list of the screening recommended for you and due dates:  Health Maintenance  Topic Date Due   COVID-19 Vaccine (5 - 2024-25 season) 08/02/2023   Hemoglobin A1C  12/19/2023   Eye exam for diabetics  03/09/2024   Complete foot exam   06/17/2024   Yearly kidney function blood test for diabetes  07/09/2024   Yearly kidney health urinalysis for diabetes  07/09/2024   Medicare Annual Wellness Visit  02/10/2025   DTaP/Tdap/Td vaccine (2 - Td or Tdap) 05/22/2027   Pneumonia Vaccine  Completed   Flu Shot  Completed   DEXA scan (bone density measurement)  Completed   Zoster (Shingles) Vaccine  Completed   HPV Vaccine  Aged Out   Colon Cancer Screening  Discontinued    Advanced directives: (Copy Requested) Please bring a copy of your health care power of attorney and living will to the office to be added to your chart at your convenience. You can mail to [facility] [address] [place], [place] [address] or email to [email or web address]  Next Medicare Annual Wellness Visit scheduled for next year: Yes

## 2024-02-11 NOTE — Progress Notes (Signed)
 Subjective:   Sarah [name] is a 83 y.o. who presents for a Medicare Wellness preventive visit.  Visit Complete: Virtual I connected with  Octavio Graves on 02/11/24 by a audio enabled telemedicine application and verified that I am speaking with the correct person using two identifiers.  Patient Location: Home  Provider Location: Office/Clinic  I discussed the limitations of evaluation and management by telemedicine. The patient expressed understanding and agreed to proceed.  Vital Signs: Because this visit was a virtual/telehealth visit, some criteria may be missing or patient reported. Any vitals not documented were not able to be obtained and vitals that have been documented are patient reported.  VideoDeclined- This patient declined Librarian, academic. Therefore the visit was completed with audio only.  AWV Questionnaire: No: Patient Medicare AWV questionnaire was not completed prior to this visit.  Cardiac Risk Factors include: advanced age (>48men, >36 women);hypertension;dyslipidemia;Other (see comment), Risk factor comments: OAB     Objective:    Today's Vitals   02/11/24 0932  Weight: 146 lb (66.2 kg)  Height: 5\' 2"  (1.575 m)   Body mass index is 26.7 kg/m.     02/11/2024    9:47 AM 02/10/2023    9:19 AM 01/28/2022    3:47 PM 01/21/2021    2:03 PM 10/24/2019    1:07 PM 06/30/2017    5:01 PM 06/30/2017    4:21 PM  Advanced Directives  Does Patient Have a Medical Advance Directive? Yes Yes No Yes No  No  Type of Estate agent of [employer] Power of [place];Living will       Does patient want to make changes to medical advance directive?    No - Patient declined     Copy of Healthcare Power of Attorney in Chart?  No - copy requested       Would patient like information on creating a medical advance directive?   No - Patient declined  No - Patient declined -- Yes (ED - Information included in AVS)    Current  Medications (verified) Outpatient Encounter Medications as of 02/11/2024  Medication Sig   acetaminophen (TYLENOL) 500 MG tablet Take 500 mg by mouth every 6 (six) hours as needed.   allopurinol (ZYLOPRIM) 100 MG tablet TAKE 2 TABLETS BY MOUTH DAILY   atorvastatin (LIPITOR) 10 MG tablet TAKE 1 TABLET(10 MG) BY MOUTH DAILY   brimonidine (ALPHAGAN) 0.2 % ophthalmic solution 1 drop 3 (three) times daily.   budesonide-formoterol (SYMBICORT) 160-4.5 MCG/ACT inhaler INHALE 2 PUFFS INTO THE LUNGS TWICE DAILY   clobetasol cream (TEMOVATE) 0.05 % Apply 1 Application topically 2 (two) times daily.   dorzolamide-timolol (COSOPT) 2-0.5 % ophthalmic solution PLACE 1 DROP INTO BOTH EYES IN THE MORNING, AT NOON, AND AT BEDTIME   hydrALAZINE (APRESOLINE) 50 MG tablet TAKE 1 TABLET(50 MG) BY MOUTH THREE TIMES DAILY   levocetirizine (XYZAL) 5 MG tablet TAKE 1 TABLET(5 MG) BY MOUTH EVERY EVENING   Nebivolol HCl 20 MG TABS TAKE 1 TABLET(20 MG) BY MOUTH DAILY   Netarsudil-Latanoprost (ROCKLATAN) 0.02-0.005 % SOLN Apply to eye.   TIADYLT ER 300 MG 24 hr capsule TAKE 1 CAPSULE BY MOUTH DAILY BEFORE BREAKFAST   zolpidem (AMBIEN) 5 MG tablet TAKE 1 TABLET(5 MG) BY MOUTH AT BEDTIME AS NEEDED FOR SLEEP   [DISCONTINUED] valsartan (DIOVAN) 160 MG tablet Take 1 tablet (160 mg total) by mouth daily.   No facility-administered encounter medications on file as of 02/11/2024.  Allergies (verified) Lisinopril   History: Past Medical History:  Diagnosis Date   Allergic rhinitis    Arthritis    Chronic back pain    Contact dermatitis and other eczema, due to unspecified cause    Diabetes mellitus without complication (HCC)    Dysrhythmia    hx of heart beating fast    Glaucoma    POAG OU   History of sinusitis    Hx of adenomatous colonic polyps 10/17/2014   Hypertension    Hypertensive retinopathy    OU   Peripheral vascular disease (HCC)    hx of DVT - left leg    Personal history of unspecified circulatory  disease    Plantar fascial fibromatosis    Shortness of breath    with exertion    Past Surgical History:  Procedure Laterality Date   ABDOMINAL HYSTERECTOMY     CATARACT EXTRACTION Bilateral 2018   EYE SURGERY Bilateral 2018   Cat Sx   FLEXIBLE SIGMOIDOSCOPY     FOOT SURGERY Bilateral    History of Lumbar fusion     '87   SHOULDER ARTHROSCOPY WITH ROTATOR CUFF REPAIR AND SUBACROMIAL DECOMPRESSION Left 01/14/2013   Procedure: Left Shoulder Arthroscopy with Debridement, Subacromial Decompression,rotator cuff repair;  Surgeon: Kathryne Hitch, MD;  Location: [facility];  Service: Orthopedics;  Laterality: Left;  Left Shoulder Arthroscopy with Debridement, Subacromial Decompression   Family History  Problem Relation Age of Onset   Leukemia Mother    Brain cancer Father        mets   Throat cancer Brother        x 2 brothers   Social History   Socioeconomic History   Marital status: Married    Spouse name: Loma [place]   Number of children: 2   Years of education: Not on file   Highest education level: Not on file  Occupational History   Occupation: retired  Tobacco Use   Smoking status: Never   Smokeless tobacco: Never  Vaping Use   Vaping status: Never Used  Substance and Sexual Activity   Alcohol use: No   Drug use: No   Sexual activity: Not Currently  Other Topics Concern   Not on file  Social History Narrative   HSG. Married- '63. 1 daughter '64, 1 son '70,  2 grandchildren. Work: retired. Lives at home with her daughter.  Husband is in a nursing home.   Social Drivers of Corporate investment banker Strain: Low Risk  (02/11/2024)   Overall Financial Resource Strain (CARDIA)    Difficulty of Paying Living Expenses: Not hard at all  Food Insecurity: No Food Insecurity (02/11/2024)   Hunger Vital Sign    Worried About Running Out of Food in the Last Year: Never true    Ran Out of Food in the Last Year: Never true  Transportation Needs: No Transportation Needs  (02/11/2024)   PRAPARE - Administrator, Civil Service (Medical): No    Lack of Transportation (Non-Medical): No  Physical Activity: Sufficiently Active (02/11/2024)   Exercise Vital Sign    Days of Exercise per Week: 7 days    Minutes of Exercise per Session: 30 min  Stress: No Stress Concern Present (02/11/2024)   [employer] of Occupational Health - Occupational Stress Questionnaire    Feeling of Stress : Not at all  Social Connections: Socially Isolated (02/11/2024)   Social Connection and Isolation Panel [NHANES]    Frequency of Communication with Friends and  Family: Once a week    Frequency of Social Gatherings with Friends and Family: Never    Attends Religious Services: Never    Database administrator or Organizations: No    Attends Engineer, structural: Never    Marital Status: Married    Tobacco Counseling Counseling given: Not Answered    Clinical Intake:  Pre-visit preparation completed: Yes  Pain : No/denies pain     BMI - recorded: 26.7 Nutritional Status: BMI 25 -29 Overweight Nutritional Risks: None Diabetes: No  How often do you need to have someone help you when you read instructions, pamphlets, or other written materials from your doctor or pharmacy?: 1 - Never  Interpreter Needed?: No  Information entered by :: Adoni Greenough, RMA   Activities of Daily Living     02/11/2024    9:34 AM  In your present state of health, do you have any difficulty performing the following activities:  Hearing? 0  Vision? 0  Difficulty concentrating or making decisions? 0  Walking or climbing stairs? 0  Dressing or bathing? 0  Doing errands, shopping? 0  Preparing Food and eating ? N  Using the Toilet? N  In the past six months, have you accidently leaked urine? N  Do you have problems with loss of bowel control? N  Managing your Medications? N  Managing your Finances? N  Housekeeping or managing your Housekeeping? N    Patient Care  Team: Etta Grandchild, MD as PCP - General (Internal Medicine) Regal, Kirstie Peri, DPM as Consulting Physician (Podiatry) Estanislado Emms, MD as Consulting Physician (Internal Medicine) Rennis Chris, MD as Consulting Physician (Ophthalmology) Szabat, Vinnie Level, [facility] (Inactive) (Pharmacist) Diona Foley, MD as Consulting Physician (Ophthalmology)  Indicate any recent Medical Services you may have received from other than [facility] providers in the past year (date may be approximate).     Assessment:   This is a routine wellness examination for [place].  Hearing/Vision screen Hearing Screening - Comments:: Denies hearing difficulties   Vision Screening - Comments:: Has implants    Goals Addressed             This Visit's Progress    Stay as healthy as possible to keep blood pressure and blood sugar in parameter   On track    I will continue to follow heart healthy and diabetic diet.        Depression Screen     02/11/2024    9:55 AM 02/10/2023    9:51 AM 02/10/2023    9:23 AM 01/21/2022    3:10 PM 01/21/2021    2:26 PM 10/24/2019    1:10 PM 09/22/2018   11:54 AM  PHQ 2/9 Scores  PHQ - 2 Score 3 0 0 0 0 1 0  PHQ- 9 Score 3 1 1       Exception Documentation  Other- indicate reason in comment box Patient refusal        Fall Risk     02/11/2024    9:48 AM 02/10/2023    9:20 AM 01/28/2022    3:49 PM 01/21/2022    3:10 PM 01/21/2021    2:04 PM  Fall Risk   Falls in the past year? 0 0 0 0 0  Number falls in past yr: 0 0 0  0  Injury with Fall? 0 0 0  0  Risk for fall due to : No Fall Risks No Fall Risks No Fall Risks  No Fall Risks  Follow up Falls prevention discussed;Falls evaluation completed Falls prevention discussed Falls evaluation completed      MEDICARE RISK AT HOME:  Medicare Risk at Home Any stairs in or around the home?: Yes (Patient does not go up stairs) If so, are there any without handrails?: Yes Home free of loose throw rugs in walkways, pet beds, electrical  cords, etc?: Yes Adequate lighting in your home to reduce risk of falls?: Yes Life alert?: No Use of a cane, walker or w/c?: Yes (sometimes a cane) Grab bars in the bathroom?: Yes Shower chair or bench in shower?: No Elevated toilet seat or a handicapped toilet?: Yes  TIMED UP AND GO:  Was the test performed?  No  Cognitive Function: 6CIT completed    06/30/2017    5:03 PM  MMSE - Mini Mental State Exam  Orientation to time 5  Orientation to Place 5  Registration 3  Attention/ Calculation 4  Recall 2  Language- name 2 objects 2  Language- repeat 1  Language- follow 3 step command 3  Language- read & follow direction 1  Write a sentence 1  Copy design 1  Total score 28        02/11/2024    9:35 AM 02/10/2023    9:20 AM  6CIT Screen  What Year? 0 points 0 points  What month? 0 points 0 points  What time? 0 points 0 points  Count back from 20 0 points 0 points  Months in reverse 4 points 0 points  Repeat phrase 6 points 0 points  Total Score 10 points 0 points    Immunizations Immunization History  Administered Date(s) Administered   Fluad Quad(high Dose 65+) 08/01/2019, 09/03/2022   Fluad Trivalent(High Dose 65+) 11/05/2023   Influenza Split 09/08/2011   Influenza, High Dose Seasonal PF 10/15/2017, 09/21/2018   Influenza, Seasonal, Injecte, Preservative Fre 12/08/2012   Influenza,inj,Quad PF,6+ Mos 08/09/2013, 08/14/2014, 01/24/2016, 07/22/2016   Influenza-Unspecified 11/11/2020, 08/31/2021   Moderna Covid-19 Vaccine Bivalent Booster 25yrs & up 05/30/2022   Moderna Sars-Covid-2 Vaccination 01/13/2020, 02/10/2020, 12/02/2020   Pneumococcal Conjugate-13 12/18/2014   Pneumococcal Polysaccharide-23 08/09/2013, 10/10/2019   Tdap 05/21/2017   Tetanus 02/09/2014   Zoster Recombinant(Shingrix) 05/30/2022, 08/22/2022   Zoster, Live 02/09/2014    Screening Tests Health Maintenance  Topic Date Due   COVID-19 Vaccine (5 - 2024-25 season) 08/02/2023   HEMOGLOBIN A1C   12/19/2023   OPHTHALMOLOGY EXAM  03/09/2024   FOOT EXAM  06/17/2024   Diabetic kidney evaluation - eGFR measurement  07/09/2024   Diabetic kidney evaluation - Urine ACR  07/09/2024   Medicare Annual Wellness (AWV)  02/10/2025   DTaP/Tdap/Td (2 - Td or Tdap) 05/22/2027   Pneumonia Vaccine 45+ Years old  Completed   INFLUENZA VACCINE  Completed   DEXA SCAN  Completed   Zoster Vaccines- Shingrix  Completed   HPV VACCINES  Aged Out   Colonoscopy  Discontinued    Health Maintenance  Health Maintenance Due  Topic Date Due   COVID-19 Vaccine (5 - 2024-25 season) 08/02/2023   HEMOGLOBIN A1C  12/19/2023   Health Maintenance Items Addressed: A1C, See Nurse Notes  Additional Screening:  Vision Screening: Recommended annual ophthalmology exams for early detection of glaucoma and other disorders of the eye.  Dental Screening: Recommended annual dental exams for proper oral hygiene  Community Resource Referral / Chronic Care Management: CRR required this visit?  No   CCM required this visit?  No     Plan:  I have personally reviewed and noted the following in the patient's chart:   Medical and social history Use of alcohol, tobacco or illicit drugs  Current medications and supplements including opioid prescriptions. Patient is not currently taking opioid prescriptions. Functional ability and status Nutritional status Physical activity Advanced directives List of other physicians Hospitalizations, surgeries, and ER visits in previous 12 months Vitals Screenings to include cognitive, depression, and falls Referrals and appointments  In addition, I have reviewed and discussed with patient certain preventive protocols, quality metrics, and best practice recommendations. A written personalized care plan for preventive services as well as general preventive health recommendations were provided to patient.     Biruk Troia L Devina Bezold, CMA   02/11/2024   After Visit Summary: (Mail)  Due to this being a telephonic visit, the after visit summary with patients personalized plan was offered to patient via mail   Notes: Please refer to Routing Comments.

## 2024-02-24 ENCOUNTER — Telehealth: Payer: Self-pay | Admitting: Internal Medicine

## 2024-02-24 DIAGNOSIS — Z01419 Encounter for gynecological examination (general) (routine) without abnormal findings: Secondary | ICD-10-CM | POA: Diagnosis not present

## 2024-02-24 DIAGNOSIS — Z6826 Body mass index (BMI) 26.0-26.9, adult: Secondary | ICD-10-CM | POA: Diagnosis not present

## 2024-02-24 NOTE — Telephone Encounter (Unsigned)
 Copied from CRM [id number]. Topic: Clinical - Medical Advice >> Feb 24, 2024  3:31 PM Alcus Dad wrote: Reason for CRM: Meagan from Dr. Gaye Alken office stated that patient BP is elevated. It was 182/70 then 189/71. Meagan wants some advice on what to do next. Please contact patient today

## 2024-02-24 NOTE — Telephone Encounter (Signed)
 Do you want her to be seen in the office as soon as possible ?

## 2024-02-24 NOTE — Telephone Encounter (Signed)
 Patient has been scheduled

## 2024-02-25 ENCOUNTER — Ambulatory Visit: Admitting: Internal Medicine

## 2024-02-25 ENCOUNTER — Encounter: Payer: Self-pay | Admitting: Internal Medicine

## 2024-02-25 VITALS — BP 160/68 | HR 65 | Temp 98.2°F | Resp 16 | Ht 62.0 in | Wt 143.8 lb

## 2024-02-25 DIAGNOSIS — E790 Hyperuricemia without signs of inflammatory arthritis and tophaceous disease: Secondary | ICD-10-CM

## 2024-02-25 DIAGNOSIS — D638 Anemia in other chronic diseases classified elsewhere: Secondary | ICD-10-CM | POA: Diagnosis not present

## 2024-02-25 DIAGNOSIS — I10 Essential (primary) hypertension: Secondary | ICD-10-CM | POA: Diagnosis not present

## 2024-02-25 DIAGNOSIS — N184 Chronic kidney disease, stage 4 (severe): Secondary | ICD-10-CM | POA: Diagnosis not present

## 2024-02-25 MED ORDER — TERAZOSIN HCL 2 MG PO CAPS: 2.0000 mg | ORAL_CAPSULE | Freq: Every day | ORAL | 0 refills | Status: DC

## 2024-02-25 NOTE — Progress Notes (Unsigned)
 Subjective:  Patient ID: Sarah [name], female    DOB: 21-Apr-1941  Age: 83 y.o. MRN: [id number]  CC: Hypertension   HPI CARRAH EPPOLITO presents for f/up ---  Discussed the use of AI scribe software for clinical note transcription with the patient, who gave verbal consent to proceed.  History of Present Illness   Sarah [name] is an 83 year old female with hypertension and chronic kidney disease who presents with elevated blood pressure. She was referred by her gynecologist due to elevated blood pressure.  She has elevated blood pressure, initially noted during a recent visit to her gynecologist. She denies headaches, blurred vision, chest pain, shortness of breath, or peripheral edema. She experiences sleepiness, which she attributes to waking up at 2 AM, a pattern she has maintained since her working days.  She is currently taking hydralazine for hypertension. She recently visited her nephrologist, who prescribed 1000 grams of vitamin D and bicarbonate of soda for acidosis, noting acidosis in her blood. No abdominal pain is reported.  She has a history of serious kidney disease and was on short-term dialysis over 30 years ago following a severe illness. She is not currently on dialysis, and her nephrologist has indicated she is doing fine. She does not have diabetes, with a last A1c of 5.8%, indicating prediabetes.       Outpatient Medications Prior to Visit  Medication Sig Dispense Refill   acetaminophen (TYLENOL) 500 MG tablet Take 500 mg by mouth every 6 (six) hours as needed.     allopurinol (ZYLOPRIM) 100 MG tablet TAKE 2 TABLETS BY MOUTH DAILY 90 tablet 0   atorvastatin (LIPITOR) 10 MG tablet TAKE 1 TABLET(10 MG) BY MOUTH DAILY 90 tablet 0   brimonidine (ALPHAGAN) 0.2 % ophthalmic solution 1 drop 3 (three) times daily.     budesonide-formoterol (SYMBICORT) 160-4.5 MCG/ACT inhaler INHALE 2 PUFFS INTO THE LUNGS TWICE DAILY 30.6 g 0   clobetasol cream (TEMOVATE) 0.05 % Apply 1  Application topically 2 (two) times daily. 60 g 2   dorzolamide-timolol (COSOPT) 2-0.5 % ophthalmic solution PLACE 1 DROP INTO BOTH EYES IN THE MORNING, AT NOON, AND AT BEDTIME 10 mL 3   hydrALAZINE (APRESOLINE) 50 MG tablet TAKE 1 TABLET(50 MG) BY MOUTH THREE TIMES DAILY 270 tablet 0   levocetirizine (XYZAL) 5 MG tablet TAKE 1 TABLET(5 MG) BY MOUTH EVERY EVENING 90 tablet 1   Nebivolol HCl 20 MG TABS TAKE 1 TABLET(20 MG) BY MOUTH DAILY 90 tablet 0   Netarsudil-Latanoprost (ROCKLATAN) 0.02-0.005 % SOLN Apply to eye.     TIADYLT ER 300 MG 24 hr capsule TAKE 1 CAPSULE BY MOUTH DAILY BEFORE BREAKFAST 90 capsule 0   VITAMIN D PO Take 1,000 mg by mouth daily.     zolpidem (AMBIEN) 5 MG tablet TAKE 1 TABLET(5 MG) BY MOUTH AT BEDTIME AS NEEDED FOR SLEEP 90 tablet 0   No facility-administered medications prior to visit.    ROS Review of Systems  Constitutional: Negative.  Negative for diaphoresis and fatigue.  HENT:  Positive for congestion and postnasal drip. Negative for sore throat.   Eyes: Negative.   Respiratory: Negative.  Negative for cough, chest tightness and wheezing.   Cardiovascular:  Negative for chest pain, palpitations and leg swelling.  Gastrointestinal: Negative.  Negative for abdominal pain, constipation, diarrhea, nausea and vomiting.  Endocrine: Negative.   Genitourinary: Negative.  Negative for difficulty urinating.  Musculoskeletal: Negative.  Negative for arthralgias and myalgias.  Skin: Negative.  Neurological:  Negative for dizziness and weakness.  Hematological:  Negative for adenopathy. Does not bruise/bleed easily.  Psychiatric/Behavioral:  Positive for confusion and decreased concentration.     Objective:  BP (!) 160/68 (BP Location: Left Arm, Patient Position: Sitting, Cuff Size: Normal)   Pulse 65   Temp 98.2 F (36.8 C) (Oral)   Resp 16   Ht 5\' 2"  (1.575 m)   Wt 143 lb 12.8 oz (65.2 kg)   SpO2 99%   BMI 26.30 kg/m   BP Readings from Last 3  Encounters:  02/25/24 (!) 160/68  11/05/23 (!) 140/68  06/18/23 128/68    Wt Readings from Last 3 Encounters:  02/25/24 143 lb 12.8 oz (65.2 kg)  02/11/24 146 lb (66.2 kg)  11/05/23 146 lb 12.8 oz (66.6 kg)    Physical Exam Vitals reviewed.  Constitutional:      Appearance: Normal appearance.  HENT:     Nose: Nose normal.     Mouth/Throat:     Mouth: Mucous membranes are moist.  Eyes:     General: No scleral icterus.    Conjunctiva/sclera: Conjunctivae normal.  Cardiovascular:     Rate and Rhythm: Normal rate and regular rhythm.     Heart sounds: No murmur heard. Pulmonary:     Effort: Pulmonary effort is normal.     Breath sounds: No stridor. No wheezing, rhonchi or rales.  Abdominal:     General: Abdomen is flat.     Palpations: There is no mass.     Tenderness: There is no abdominal tenderness. There is no guarding.     Hernia: No hernia is present.  Musculoskeletal:        General: Normal range of motion.     Cervical back: Neck supple.     Right lower leg: No edema.     Left lower leg: No edema.  Skin:    General: Skin is warm and dry.  Neurological:     General: No focal deficit present.     Mental Status: She is alert.  Psychiatric:        Mood and Affect: Mood normal.        Behavior: Behavior normal.     Lab Results  Component Value Date   WBC 4.3 09/03/2022   HGB 11.8 (A) 02/01/2024   HCT 32.1 (L) 09/03/2022   PLT 210.0 09/03/2022   GLUCOSE 92 06/18/2023   CHOL 131 11/05/2023   TRIG 48.0 11/05/2023   HDL 62.70 11/05/2023   LDLCALC 58 11/05/2023   ALT 19 11/05/2023   AST 20 11/05/2023   NA 144 07/10/2023   K 5.4 (A) 02/01/2024   CL 110 (A) 07/10/2023   CREATININE 1.6 (A) 02/01/2024   BUN 35 (A) 02/01/2024   CO2 17 02/01/2024   TSH 1.94 11/05/2023   HGBA1C 5.8 06/18/2023   MICROALBUR 0.8 09/21/2018    VAS [place] LOWER EXTREMITY VENOUS (DVT) Result Date: 06/25/2023  Lower Venous DVT Study Patient Name:  Sarah [name]  Date of Exam:    06/25/2023 Medical Rec #: [id number]      Accession #:    [id number] Date of Birth: 12/09/1940      Patient Gender: F Patient Age:   77 years Exam Location:  [facility] Procedure:      VAS [place] LOWER EXTREMITY VENOUS (DVT) Referring Phys: Sanda Linger --------------------------------------------------------------------------------  Indications: Pain.  Comparison Study: no prior Performing Technologist: [place] Ponder RVS  Examination Guidelines: A complete evaluation includes  B-mode imaging, spectral Doppler, color Doppler, and power Doppler as needed of all accessible portions of each vessel. Bilateral testing is considered an integral part of a complete examination. Limited examinations for reoccurring indications may be performed as noted. The reflux portion of the exam is performed with the patient in reverse Trendelenburg.  +-----+---------------+---------+-----------+----------+--------------+ RIGHTCompressibilityPhasicitySpontaneityPropertiesThrombus Aging +-----+---------------+---------+-----------+----------+--------------+ CFV  Full           Yes      Yes                                 +-----+---------------+---------+-----------+----------+--------------+   +---------+---------------+---------+-----------+----------+--------------+ LEFT     CompressibilityPhasicitySpontaneityPropertiesThrombus Aging +---------+---------------+---------+-----------+----------+--------------+ CFV      Full           Yes      Yes                                 +---------+---------------+---------+-----------+----------+--------------+ SFJ      Full                                                        +---------+---------------+---------+-----------+----------+--------------+ FV Prox  Full                                                        +---------+---------------+---------+-----------+----------+--------------+ FV Mid   Full                                                         +---------+---------------+---------+-----------+----------+--------------+ FV DistalFull                                                        +---------+---------------+---------+-----------+----------+--------------+ PFV      Full                                                        +---------+---------------+---------+-----------+----------+--------------+ POP      Full           Yes      Yes                                 +---------+---------------+---------+-----------+----------+--------------+ PTV      Full                                                        +---------+---------------+---------+-----------+----------+--------------+  PERO     Full                                                        +---------+---------------+---------+-----------+----------+--------------+     Summary: RIGHT: - No evidence of common femoral vein obstruction.  LEFT: - There is no evidence of deep vein thrombosis in the lower extremity.  - No cystic structure found in the popliteal fossa.  *See table(s) above for measurements and observations. Electronically signed by Waverly Ferrari MD on 06/25/2023 at 2:34:21 PM.    Final     Assessment & Plan:  Hyperuricemia  Essential hypertension- Her BP remains high. Will add a peripheral alpha blocker. -     Terazosin HCl; Take 1 capsule (2 mg total) by mouth at bedtime.  Dispense: 90 capsule; Refill: 0  Anemia, chronic disease- Improved.  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  Dispense: 90 capsule; Refill: 0     Follow-up: Return in about 3 months (around 05/27/2024).  Sanda Linger, MD

## 2024-02-25 NOTE — Patient Instructions (Signed)
 Chronic Kidney Disease in Adults: What to Know Chronic kidney disease (CKD) is when lasting damage happens to the kidneys slowly over time. The kidneys are two organs that do many important things in the body. These include: Taking waste and extra fluid out of the blood to make pee (urine). Making hormones. Keeping the right amount of fluids and chemicals in the body. A small amount of kidney damage may not cause problems. You must take steps to help keep the kidney damage from getting worse. A lot of damage may cause kidney failure. Kidney failure means the kidneys can no longer work right. What are the causes? Diabetes. High blood pressure. Diseases that affect the heart and blood vessels. Other kidney diseases. Diseases that affect the body's defense system (immune system). A problem with the flow of pee. This may be caused by: Kidney stones. Cancer. An enlarged prostate, in males. A kidney infection or urinary tract infection (UTI) that keeps coming back. What increases the risk? Getting older. The chances of having CKD increase with age. A family history of kidney disease or kidney failure. Having a disease caused by genes. Taking medicines that can harm the kidneys. Being near or having contact with harmful substances. Being very overweight. Using tobacco now or in the past. What are the signs or symptoms? Common symptoms of CKD include: Feeling very tired and having less energy. Swelling of the face, legs, ankles, or feet. Throwing up or feeling like you may throw up. Not wanting to eat as much as normal. Being confused or not able to focus. Twitches and cramps in the leg muscles or other muscles. Dry, itchy skin. Other symptoms may include: Shortness of breath. Trouble sleeping. Making less pee, or making more pee, especially at night. A taste of metal in your mouth. You may also become anemic. Anemia means there's not enough red blood cells in your blood. You may get  symptoms slowly. You may not notice them until the kidney damage gets very bad. How is this diagnosed? CKD may be diagnosed based on: Tests on your blood or pee. Imaging tests, like an ultrasound or a CT scan. A kidney biopsy. For this test, a sample of kidney tissue is removed to be looked at under a microscope. These tests will help to find out how serious the CKD is. How is this treated? Often, there's no cure for CKD. Treatment can help with symptoms and help keep the disease from getting worse. Treatment may include: Treating other problems that are causing your CKD or making it worse. Diet changes. You may need to: Avoid alcohol. Avoid foods that are high in salt, potassium, phosphorous, and protein. Taking medicines for symptoms and to help control other conditions. Dialysis. This treatment gets harmful waste out of your body. It may be needed if you have kidney failure. Follow these instructions at home: Medicines Take your medicines only as told. The amount of some medicines you take may need to be changed. Do not take any new medicines, vitamins, or supplements unless your health care provider says it's okay. These may make kidney damage worse. Lifestyle Do not smoke, vape, or use nicotine or tobacco. If you drink alcohol: Limit how much you have to: 0-1 drink a day if you're female. 0-2 drinks a day if you're female. Know how much alcohol is in your drink. In the [place] one drink is one 12 oz bottle of beer (355 mL), one 5 oz glass of wine (148 mL), or one 1 oz  glass of hard liquor (44 mL). Stay at a healthy weight. If you need help, ask your provider. General instructions  Eat and drink as told. Track your blood pressure at home. Tell your provider about any changes. If you have diabetes, track your blood sugar as told. Exercise at least 30 minutes a day, 5 days a week. Keep your shots (vaccinations) up to date. Keep all follow-up visits. Your provider may need to change  your treatments over time. Where to find support American Kidney Fund: [place].com.au Kidney School: kidneyschool.org American Association of Kidney Patients: https://www.miller-montoya.com/ Where to find more information National Kidney Foundation: kidney.org Centers for Disease Control and Prevention. To learn more: Go to DiningCalendar.de. Click "Search". Type "chronic kidney disease" in the search box. Contact a health care provider if: You have new symptoms. You get symptoms of end-stage kidney disease. These include: Headaches. Numbness in your hands or feet. Leg cramps. Easy bruising. Get help right away if: You have a fever. You make less pee than usual. You have pain or bleeding when you pee or poop. You have chest pain. You have shortness of breath. These symptoms may be an emergency. Call 911 right away. Do not wait to see if the symptoms will go away. Do not drive yourself to the hospital. This information is not intended to replace advice given to you by your health care provider. Make sure you discuss any questions you have with your health care provider. Document Revised: 09/29/2023 Document Reviewed: 05/22/2023 Elsevier Patient Education  2024 [employer].

## 2024-03-08 ENCOUNTER — Other Ambulatory Visit: Payer: Self-pay | Admitting: Internal Medicine

## 2024-03-08 DIAGNOSIS — F409 Phobic anxiety disorder, unspecified: Secondary | ICD-10-CM

## 2024-03-10 DIAGNOSIS — H04123 Dry eye syndrome of bilateral lacrimal glands: Secondary | ICD-10-CM | POA: Diagnosis not present

## 2024-03-10 DIAGNOSIS — H59033 Cystoid macular edema following cataract surgery, bilateral: Secondary | ICD-10-CM | POA: Diagnosis not present

## 2024-03-10 DIAGNOSIS — H401131 Primary open-angle glaucoma, bilateral, mild stage: Secondary | ICD-10-CM | POA: Diagnosis not present

## 2024-03-27 ENCOUNTER — Other Ambulatory Visit: Payer: Self-pay | Admitting: Internal Medicine

## 2024-03-27 DIAGNOSIS — E790 Hyperuricemia without signs of inflammatory arthritis and tophaceous disease: Secondary | ICD-10-CM

## 2024-03-27 DIAGNOSIS — M1A39X0 Chronic gout due to renal impairment, multiple sites, without tophus (tophi): Secondary | ICD-10-CM

## 2024-03-27 DIAGNOSIS — I10 Essential (primary) hypertension: Secondary | ICD-10-CM

## 2024-04-04 ENCOUNTER — Other Ambulatory Visit: Payer: Self-pay | Admitting: Internal Medicine

## 2024-04-04 DIAGNOSIS — E790 Hyperuricemia without signs of inflammatory arthritis and tophaceous disease: Secondary | ICD-10-CM

## 2024-04-04 DIAGNOSIS — I10 Essential (primary) hypertension: Secondary | ICD-10-CM

## 2024-04-04 DIAGNOSIS — M1A39X0 Chronic gout due to renal impairment, multiple sites, without tophus (tophi): Secondary | ICD-10-CM

## 2024-04-04 MED ORDER — NEBIVOLOL HCL 20 MG PO TABS: ORAL_TABLET | ORAL | 0 refills | Status: DC

## 2024-04-04 NOTE — Telephone Encounter (Signed)
 Copied from CRM [id number]. Topic: Clinical - Medication Refill >> Apr 04, 2024  7:43 AM Annelle Kiel wrote: Most Recent Primary Care Visit:  Provider: Arcadio Knuckles  Department: [facility]  Visit Type: OFFICE VISIT  Date: 02/25/2024  Medication: allopurinol  (ZYLOPRIM ) 100 MG tablet   Nebivolol  HCl 20 MG TABS  Has the patient contacted their pharmacy? Yes (Agent: If no, request that the patient contact the pharmacy for the refill. If patient does not wish to contact the pharmacy document the reason why and proceed with request.) (Agent: If yes, when and what did the pharmacy advise?)  Is this the correct pharmacy for this prescription? Yes If no, delete pharmacy and type the correct one.  This is the patient's preferred pharmacy:  [id number] - [place], [place] - [address] AT [facility] OF [address] [place] [address] Phone: [phone] Fax: [phone]   Has the prescription been filled recently? No  Is the patient out of the medication? Yes  Has the patient been seen for an appointment in the last year OR does the patient have an upcoming appointment? Yes  Can we respond through MyChart? No  Agent: Please be advised that Rx refills may take up to 3 business days. We ask that you follow-up with your pharmacy.

## 2024-04-12 ENCOUNTER — Other Ambulatory Visit: Payer: Self-pay | Admitting: Internal Medicine

## 2024-04-12 DIAGNOSIS — E785 Hyperlipidemia, unspecified: Secondary | ICD-10-CM

## 2024-04-12 DIAGNOSIS — I10 Essential (primary) hypertension: Secondary | ICD-10-CM

## 2024-05-17 DIAGNOSIS — Z1231 Encounter for screening mammogram for malignant neoplasm of breast: Secondary | ICD-10-CM | POA: Diagnosis not present

## 2024-05-18 ENCOUNTER — Other Ambulatory Visit: Payer: Self-pay | Admitting: Internal Medicine

## 2024-05-18 DIAGNOSIS — M1A39X0 Chronic gout due to renal impairment, multiple sites, without tophus (tophi): Secondary | ICD-10-CM

## 2024-05-18 DIAGNOSIS — E790 Hyperuricemia without signs of inflammatory arthritis and tophaceous disease: Secondary | ICD-10-CM

## 2024-05-19 ENCOUNTER — Encounter: Payer: Self-pay | Admitting: Internal Medicine

## 2024-05-19 ENCOUNTER — Ambulatory Visit: Admitting: Internal Medicine

## 2024-05-19 VITALS — BP 156/46 | HR 70 | Temp 98.8°F | Ht 62.0 in | Wt 147.2 lb

## 2024-05-19 DIAGNOSIS — I10 Essential (primary) hypertension: Secondary | ICD-10-CM | POA: Diagnosis not present

## 2024-05-19 DIAGNOSIS — N184 Chronic kidney disease, stage 4 (severe): Secondary | ICD-10-CM

## 2024-05-19 DIAGNOSIS — E875 Hyperkalemia: Secondary | ICD-10-CM | POA: Insufficient documentation

## 2024-05-19 DIAGNOSIS — D638 Anemia in other chronic diseases classified elsewhere: Secondary | ICD-10-CM | POA: Diagnosis not present

## 2024-05-19 LAB — URINALYSIS, ROUTINE W REFLEX MICROSCOPIC
Bilirubin Urine: NEGATIVE
Hgb urine dipstick: NEGATIVE
Ketones, ur: NEGATIVE
Leukocytes,Ua: NEGATIVE
Nitrite: NEGATIVE
RBC / HPF: NONE SEEN (ref 0–?)
Specific Gravity, Urine: 1.01 (ref 1.000–1.030)
Total Protein, Urine: NEGATIVE
Urine Glucose: NEGATIVE
Urobilinogen, UA: 0.2 (ref 0.0–1.0)
pH: 6.5 (ref 5.0–8.0)

## 2024-05-19 LAB — CBC WITH DIFFERENTIAL/PLATELET
Basophils Absolute: 0 10*3/uL (ref 0.0–0.1)
Basophils Relative: 0.4 % (ref 0.0–3.0)
Eosinophils Absolute: 0.4 10*3/uL (ref 0.0–0.7)
Eosinophils Relative: 7.1 % — ABNORMAL HIGH (ref 0.0–5.0)
HCT: 31.9 % — ABNORMAL LOW (ref 36.0–46.0)
Hemoglobin: 10.4 g/dL — ABNORMAL LOW (ref 12.0–15.0)
Lymphocytes Relative: 37 % (ref 12.0–46.0)
Lymphs Abs: 1.9 10*3/uL (ref 0.7–4.0)
MCHC: 32.6 g/dL (ref 30.0–36.0)
MCV: 92.2 fl (ref 78.0–100.0)
Monocytes Absolute: 0.4 10*3/uL (ref 0.1–1.0)
Monocytes Relative: 8.6 % (ref 3.0–12.0)
Neutro Abs: 2.4 10*3/uL (ref 1.4–7.7)
Neutrophils Relative %: 46.9 % (ref 43.0–77.0)
Platelets: 194 10*3/uL (ref 150.0–400.0)
RBC: 3.46 Mil/uL — ABNORMAL LOW (ref 3.87–5.11)
RDW: 14.6 % (ref 11.5–15.5)
WBC: 5.1 10*3/uL (ref 4.0–10.5)

## 2024-05-19 LAB — BASIC METABOLIC PANEL WITH GFR
BUN: 40 mg/dL — ABNORMAL HIGH (ref 6–23)
CO2: 25 meq/L (ref 19–32)
Calcium: 9.3 mg/dL (ref 8.4–10.5)
Chloride: 109 meq/L (ref 96–112)
Creatinine, Ser: 1.87 mg/dL — ABNORMAL HIGH (ref 0.40–1.20)
GFR: 24.8 mL/min — ABNORMAL LOW (ref >60.00)
Glucose, Bld: 92 mg/dL (ref 70–99)
Potassium: 4.6 meq/L (ref 3.5–5.1)
Sodium: 141 meq/L (ref 135–145)

## 2024-05-19 NOTE — Patient Instructions (Signed)
 Chronic Kidney Disease in Adults: What to Know Chronic kidney disease (CKD) is when lasting damage happens to the kidneys slowly over time. The kidneys are two organs that do many important things in the body. These include: Taking waste and extra fluid out of the blood to make pee (urine). Making hormones. Keeping the right amount of fluids and chemicals in the body. A small amount of kidney damage may not cause problems. You must take steps to help keep the kidney damage from getting worse. A lot of damage may cause kidney failure. Kidney failure means the kidneys can no longer work right. What are the causes? Diabetes. High blood pressure. Diseases that affect the heart and blood vessels. Other kidney diseases. Diseases that affect the body's defense system (immune system). A problem with the flow of pee. This may be caused by: Kidney stones. Cancer. An enlarged prostate, in males. A kidney infection or urinary tract infection (UTI) that keeps coming back. What increases the risk? Getting older. The chances of having CKD increase with age. A family history of kidney disease or kidney failure. Having a disease caused by genes. Taking medicines that can harm the kidneys. Being near or having contact with harmful substances. Being very overweight. Using tobacco now or in the past. What are the signs or symptoms? Common symptoms of CKD include: Feeling very tired and having less energy. Swelling of the face, legs, ankles, or feet. Throwing up or feeling like you may throw up. Not wanting to eat as much as normal. Being confused or not able to focus. Twitches and cramps in the leg muscles or other muscles. Dry, itchy skin. Other symptoms may include: Shortness of breath. Trouble sleeping. Making less pee, or making more pee, especially at night. A taste of metal in your mouth. You may also become anemic. Anemia means there's not enough red blood cells in your blood. You may get  symptoms slowly. You may not notice them until the kidney damage gets very bad. How is this diagnosed? CKD may be diagnosed based on: Tests on your blood or pee. Imaging tests, like an ultrasound or a CT scan. A kidney biopsy. For this test, a sample of kidney tissue is removed to be looked at under a microscope. These tests will help to find out how serious the CKD is. How is this treated? Often, there's no cure for CKD. Treatment can help with symptoms and help keep the disease from getting worse. Treatment may include: Treating other problems that are causing your CKD or making it worse. Diet changes. You may need to: Avoid alcohol. Avoid foods that are high in salt, potassium, phosphorous, and protein. Taking medicines for symptoms and to help control other conditions. Dialysis. This treatment gets harmful waste out of your body. It may be needed if you have kidney failure. Follow these instructions at home: Medicines Take your medicines only as told. The amount of some medicines you take may need to be changed. Do not take any new medicines, vitamins, or supplements unless your health care provider says it's okay. These may make kidney damage worse. Lifestyle Do not smoke, vape, or use nicotine or tobacco. If you drink alcohol: Limit how much you have to: 0-1 drink a day if you're female. 0-2 drinks a day if you're female. Know how much alcohol is in your drink. In the [place] one drink is one 12 oz bottle of beer (355 mL), one 5 oz glass of wine (148 mL), or one 1 oz  glass of hard liquor (44 mL). Stay at a healthy weight. If you need help, ask your provider. General instructions  Eat and drink as told. Track your blood pressure at home. Tell your provider about any changes. If you have diabetes, track your blood sugar as told. Exercise at least 30 minutes a day, 5 days a week. Keep your shots (vaccinations) up to date. Keep all follow-up visits. Your provider may need to change  your treatments over time. Where to find support American Kidney Fund: [place].com.au Kidney School: kidneyschool.org American Association of Kidney Patients: https://www.miller-montoya.com/ Where to find more information National Kidney Foundation: kidney.org Centers for Disease Control and Prevention. To learn more: Go to DiningCalendar.de. Click "Search". Type "chronic kidney disease" in the search box. Contact a health care provider if: You have new symptoms. You get symptoms of end-stage kidney disease. These include: Headaches. Numbness in your hands or feet. Leg cramps. Easy bruising. Get help right away if: You have a fever. You make less pee than usual. You have pain or bleeding when you pee or poop. You have chest pain. You have shortness of breath. These symptoms may be an emergency. Call 911 right away. Do not wait to see if the symptoms will go away. Do not drive yourself to the hospital. This information is not intended to replace advice given to you by your health care provider. Make sure you discuss any questions you have with your health care provider. Document Revised: 09/29/2023 Document Reviewed: 05/22/2023 Elsevier Patient Education  2024 [employer].

## 2024-05-19 NOTE — Progress Notes (Signed)
 Subjective:  Patient ID: Sarah [name], female    DOB: 11-18-1941  Age: 83 y.o. MRN: [id number]  CC: Medical Management of Chronic Issues (3 Month follow up. No concerns. )   HPI Sarah [name] presents for f/up ---  Discussed the use of AI scribe software for clinical note transcription with the patient, who gave verbal consent to proceed.  History of Present Illness   Sarah [name] is an 83 year old female who presents for an annual physical exam.  No chest pain, shortness of breath, dizziness, lightheadedness, or peripheral edema. No coughing or wheezing. She experiences chronic sinus drainage without new symptoms.  She had an eye exam six months ago with normal results. She remains active within her home but avoids outdoor walking due to wildlife in her area.  She adheres to her medication regimen daily without missing doses, despite disliking taking pills.       Outpatient Medications Prior to Visit  Medication Sig Dispense Refill   acetaminophen  (TYLENOL ) 500 MG tablet Take 500 mg by mouth every 6 (six) hours as needed.     allopurinol  (ZYLOPRIM ) 100 MG tablet TAKE 2 TABLETS BY MOUTH DAILY 90 tablet 0   atorvastatin  (LIPITOR) 10 MG tablet TAKE 1 TABLET(10 MG) BY MOUTH DAILY 90 tablet 1   brimonidine (ALPHAGAN) 0.2 % ophthalmic solution 1 drop 3 (three) times daily.     budesonide -formoterol  (SYMBICORT ) 160-4.5 MCG/ACT inhaler INHALE 2 PUFFS INTO THE LUNGS TWICE DAILY 30.6 g 0   clobetasol  cream (TEMOVATE ) 0.05 % Apply 1 Application topically 2 (two) times daily. 60 g 2   diltiazem  (TIAZAC ) 300 MG 24 hr capsule TAKE 1 CAPSULE BY MOUTH DAILY BEFORE BREAKFAST 90 capsule 0   dorzolamide -timolol  (COSOPT ) 2-0.5 % ophthalmic solution PLACE 1 DROP INTO BOTH EYES IN THE MORNING, AT NOON, AND AT BEDTIME 10 mL 3   hydrALAZINE  (APRESOLINE ) 50 MG tablet TAKE 1 TABLET(50 MG) BY MOUTH THREE TIMES DAILY 270 tablet 0   ketorolac (ACULAR) 0.5 % ophthalmic solution Place 1 drop into both eyes 3  (three) times daily.     levocetirizine (XYZAL ) 5 MG tablet TAKE 1 TABLET(5 MG) BY MOUTH EVERY EVENING 90 tablet 1   Nebivolol  HCl 20 MG TABS TAKE 1 TABLET(20 MG) BY MOUTH DAILY 90 tablet 1   Netarsudil-Latanoprost (ROCKLATAN) 0.02-0.005 % SOLN Apply to eye.     sodium bicarbonate 650 MG tablet Take 650 mg by mouth 2 (two) times daily.     VITAMIN D  PO Take 1,000 mg by mouth daily.     zolpidem  (AMBIEN ) 5 MG tablet TAKE 1 TABLET(5 MG) BY MOUTH AT BEDTIME AS NEEDED FOR SLEEP 90 tablet 0   losartan  (COZAAR ) 50 MG tablet Take 50 mg by mouth daily.     terazosin  (HYTRIN ) 2 MG capsule Take 1 capsule (2 mg total) by mouth at bedtime. 90 capsule 0   No facility-administered medications prior to visit.    ROS Review of Systems  Constitutional:  Negative for appetite change, chills, diaphoresis, fatigue and fever.  HENT: Negative.    Eyes: Negative.  Negative for visual disturbance.  Respiratory:  Negative for cough, chest tightness, shortness of breath and wheezing.   Cardiovascular:  Negative for chest pain, palpitations and leg swelling.  Gastrointestinal:  Negative for abdominal pain, constipation, diarrhea, nausea and vomiting.  Genitourinary: Negative.  Negative for difficulty urinating.  Musculoskeletal: Negative.   Skin: Negative.   Neurological: Negative.  Negative for dizziness, weakness and light-headedness.  Hematological:  Negative for adenopathy. Does not bruise/bleed easily.  Psychiatric/Behavioral:  Positive for confusion and decreased concentration.     Objective:  BP (!) 156/46 (BP Location: Left Arm, Patient Position: Sitting, Cuff Size: Normal)   Pulse 70   Temp 98.8 F (37.1 C) (Oral)   Ht 5' 2 (1.575 m)   Wt 147 lb 3.2 oz (66.8 kg)   SpO2 99%   BMI 26.92 kg/m   BP Readings from Last 3 Encounters:  05/19/24 (!) 156/46  02/25/24 (!) 160/68  11/05/23 (!) 140/68    Wt Readings from Last 3 Encounters:  05/19/24 147 lb 3.2 oz (66.8 kg)  02/25/24 143 lb 12.8 oz  (65.2 kg)  02/11/24 146 lb (66.2 kg)    Physical Exam Vitals reviewed.  Constitutional:      Appearance: Normal appearance.  HENT:     Mouth/Throat:     Mouth: Mucous membranes are moist.   Eyes:     General: No scleral icterus.    Conjunctiva/sclera: Conjunctivae normal.    Cardiovascular:     Rate and Rhythm: Normal rate and regular rhythm.     Heart sounds: Murmur heard.     Systolic murmur is present with a grade of 1/6.     No friction rub. No gallop.     Comments: EKG--- NSR, 64 bpm No LVH, Q waves, or ST/T wave changes  Unchanged  1/6 SEM Pulmonary:     Effort: Pulmonary effort is normal.     Breath sounds: No stridor. No wheezing, rhonchi or rales.  Abdominal:     General: Abdomen is flat.     Palpations: There is no mass.     Tenderness: There is no abdominal tenderness. There is no guarding.     Hernia: No hernia is present.   Musculoskeletal:        General: Normal range of motion.     Cervical back: Neck supple.     Right lower leg: No edema.     Left lower leg: No edema.  Lymphadenopathy:     Cervical: No cervical adenopathy.   Skin:    General: Skin is warm and dry.     Coloration: Skin is not pale.   Neurological:     General: No focal deficit present.     Mental Status: She is alert.     Lab Results  Component Value Date   WBC 5.1 05/19/2024   HGB 10.4 (L) 05/19/2024   HCT 31.9 (L) 05/19/2024   PLT 194.0 05/19/2024   GLUCOSE 92 05/19/2024   CHOL 131 11/05/2023   TRIG 48.0 11/05/2023   HDL 62.70 11/05/2023   LDLCALC 58 11/05/2023   ALT 19 11/05/2023   AST 20 11/05/2023   NA 141 05/19/2024   K 4.6 05/19/2024   CL 109 05/19/2024   CREATININE 1.87 (H) 05/19/2024   BUN 40 (H) 05/19/2024   CO2 25 05/19/2024   TSH 1.94 11/05/2023   HGBA1C 5.8 06/18/2023   MICROALBUR 0.8 09/21/2018    VAS US  LOWER EXTREMITY VENOUS (DVT) Result Date: 06/25/2023  Lower Venous DVT Study Patient Name:  LUCI BELLUCCI  Date of Exam:   06/25/2023 Medical  Rec #: [id number]      Accession #:    [id number] Date of Birth: 1941-02-16      Patient Gender: F Patient Age:   89 years Exam Location:  [facility] Procedure:      VAS US  LOWER EXTREMITY VENOUS (DVT) Referring  Phys: DEBBY MOLT --------------------------------------------------------------------------------  Indications: Pain.  Comparison Study: no prior Performing Technologist: Duwaine Hives RVS  Examination Guidelines: A complete evaluation includes B-mode imaging, spectral Doppler, color Doppler, and power Doppler as needed of all accessible portions of each vessel. Bilateral testing is considered an integral part of a complete examination. Limited examinations for reoccurring indications may be performed as noted. The reflux portion of the exam is performed with the patient in reverse Trendelenburg.  +-----+---------------+---------+-----------+----------+--------------+ RIGHTCompressibilityPhasicitySpontaneityPropertiesThrombus Aging +-----+---------------+---------+-----------+----------+--------------+ CFV  Full           Yes      Yes                                 +-----+---------------+---------+-----------+----------+--------------+   +---------+---------------+---------+-----------+----------+--------------+ LEFT     CompressibilityPhasicitySpontaneityPropertiesThrombus Aging +---------+---------------+---------+-----------+----------+--------------+ CFV      Full           Yes      Yes                                 +---------+---------------+---------+-----------+----------+--------------+ SFJ      Full                                                        +---------+---------------+---------+-----------+----------+--------------+ FV Prox  Full                                                        +---------+---------------+---------+-----------+----------+--------------+ FV Mid   Full                                                         +---------+---------------+---------+-----------+----------+--------------+ FV DistalFull                                                        +---------+---------------+---------+-----------+----------+--------------+ PFV      Full                                                        +---------+---------------+---------+-----------+----------+--------------+ POP      Full           Yes      Yes                                 +---------+---------------+---------+-----------+----------+--------------+ PTV      Full                                                        +---------+---------------+---------+-----------+----------+--------------+  PERO     Full                                                        +---------+---------------+---------+-----------+----------+--------------+     Summary: RIGHT: - No evidence of common femoral vein obstruction.  LEFT: - There is no evidence of deep vein thrombosis in the lower extremity.  - No cystic structure found in the popliteal fossa.  *See table(s) above for measurements and observations. Electronically signed by Lonni Blade MD on 06/25/2023 at 2:34:21 PM.    Final     Assessment & Plan:   Essential hypertension- BP is not at goal. There may be some non-compliance and white coat HTN. -     EKG 12-Lead -     Urinalysis, Routine w reflex microscopic; Future -     Basic metabolic panel with GFR; Future -     Terazosin  HCl; Take 1 capsule (2 mg total) by mouth at bedtime.  Dispense: 90 capsule; Refill: 0 -     AMB Referral VBCI Care Management  Hyperkalemia- No EKG changes related to this. 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  Dispense: 90 capsule; Refill: 0      Follow-up: Return in about 6 months (around 11/18/2024).  Debby Molt, MD

## 2024-05-20 ENCOUNTER — Ambulatory Visit: Payer: Self-pay | Admitting: Internal Medicine

## 2024-05-20 MED ORDER — TERAZOSIN HCL 2 MG PO CAPS: 2.0000 mg | ORAL_CAPSULE | Freq: Every day | ORAL | 0 refills | Status: DC

## 2024-05-30 ENCOUNTER — Telehealth: Payer: Self-pay | Admitting: *Deleted

## 2024-05-30 NOTE — Progress Notes (Signed)
 Care Guide Pharmacy Note  05/30/2024 Name: Sarah [name] MRN: [id number] DOB: 04/09/1941  Referred By: Joshua Debby CROME, MD Reason for referral: Complex Care Management (Outreach to schedule referral with pharmacist )   Sarah [name] is a 83 y.o. year old female who is a primary care patient of Joshua Debby CROME, MD.  Sarah [name] was referred to the pharmacist for assistance related to: HTN  Successful contact was made with the patient to discuss pharmacy services including being ready for the pharmacist to call at least 5 minutes before the scheduled appointment time and to have medication bottles and any blood pressure readings ready for review. The patient agreed to meet with the pharmacist via telephone visit on 06/10/2024  Thedford Franks, CMA [place]  [facility] Guide Direct Dial: [phone]  Fax: [phone] Website: [email or web address]

## 2024-05-31 ENCOUNTER — Other Ambulatory Visit: Payer: Self-pay | Admitting: Internal Medicine

## 2024-05-31 DIAGNOSIS — F409 Phobic anxiety disorder, unspecified: Secondary | ICD-10-CM

## 2024-06-10 ENCOUNTER — Other Ambulatory Visit: Admitting: Pharmacist

## 2024-06-10 DIAGNOSIS — N184 Chronic kidney disease, stage 4 (severe): Secondary | ICD-10-CM

## 2024-06-10 DIAGNOSIS — I10 Essential (primary) hypertension: Secondary | ICD-10-CM

## 2024-06-10 NOTE — Progress Notes (Signed)
 06/10/2024 Name: Sarah [name] MRN: [id number] DOB: 1941-01-17  Chief Complaint  Patient presents with   Hypertension   Medication Management    Sarah [name] is a 83 y.o. year old female who presented for a telephone visit.   They were referred to the pharmacist by their PCP for assistance in managing hypertension.   Subjective:  Care Team: Primary Care Provider: Joshua Debby CROME, MD ; Next Scheduled Visit: not scheduled Dr. Jerrye - nephrologist. Pt reports having an upcoming appt this month but does not know the date.  Medication Access/Adherence  Current Pharmacy:  [id number] - [place], [place] - [address] AT [facility] OF [address] [place] [address] Phone: [phone] Fax: [phone]   Patient reports affordability concerns with their medications: No  Patient reports access/transportation concerns to their pharmacy: No  Patient reports adherence concerns with their medications:  No     Hypertension:  Current medications: Diltiazem  300 mg daily, hydralazine  50 mg TID, nebivolol  20 mg daily, terazosin  2 mg at bedtime, losartan  50 mg daily Medications previously tried: chlorthalidone  (d/c due to renal function)  *Pt was not aware PCP stopped losartan , currently still taking  Patient has a validated, automated, upper arm home BP cuff Current blood pressure readings readings: reports recent reading 150/70-something. Notes it is sometimes lower however was unable to give specific readings.  Patient denies hypotensive s/sx including dizziness, lightheadedness.  Patient denies hypertensive symptoms including headache, chest pain, shortness of breath  *Pt is followed by Dr. Jerrye, nephrologist. She notes Dr. Jerrye has wanted her on losartan  for her kidneys. She thinks she has an appointment coming up this month with her.   Objective:  Lab Results  Component Value Date   HGBA1C 5.8 06/18/2023    Lab  Results  Component Value Date   CREATININE 1.87 (H) 05/19/2024   BUN 40 (H) 05/19/2024   NA 141 05/19/2024   K 4.6 05/19/2024   CL 109 05/19/2024   CO2 25 05/19/2024    Lab Results  Component Value Date   CHOL 131 11/05/2023   HDL 62.70 11/05/2023   LDLCALC 58 11/05/2023   TRIG 48.0 11/05/2023   CHOLHDL 2 11/05/2023    Medications Reviewed Today     Reviewed by Merceda Lela SAUNDERS, RPH (Pharmacist) on 06/10/24 at 0955  Med List Status: <None>   Medication Order Taking? Sig Documenting Provider Last Dose Status Informant  acetaminophen  (TYLENOL ) 500 MG tablet [id number]  Take 500 mg by mouth every 6 (six) hours as needed. [provider]  Active   allopurinol  (ZYLOPRIM ) 100 MG tablet [id number]  TAKE 2 TABLETS BY MOUTH DAILY Joshua Debby CROME, MD  Active   atorvastatin  (LIPITOR) 10 MG tablet [id number]  TAKE 1 TABLET(10 MG) BY MOUTH DAILY Joshua Debby CROME, MD  Active   brimonidine (ALPHAGAN) 0.2 % ophthalmic solution [id number]  1 drop 3 (three) times daily. [provider]  Active   budesonide -formoterol  (SYMBICORT ) 160-4.5 MCG/ACT inhaler [id number]  INHALE 2 PUFFS INTO THE LUNGS TWICE DAILY Joshua Debby CROME, MD  Active   clobetasol  cream (TEMOVATE ) 0.05 % [id number]  Apply 1 Application topically 2 (two) times daily. Joshua Debby CROME, MD  Active   diltiazem  (TIAZAC ) 300 MG 24 hr capsule [id number] Yes TAKE 1 CAPSULE BY MOUTH DAILY BEFORE OFILIA Joshua Debby CROME, MD  Active   dorzolamide -timolol  (COSOPT ) 2-0.5 % ophthalmic solution [id number]  PLACE 1 DROP INTO BOTH EYES  IN THE MORNING, AT NOON, AND AT BEDTIME Valdemar Rogue, MD  Active   hydrALAZINE  (APRESOLINE ) 50 MG tablet [id number] Yes TAKE 1 TABLET(50 MG) BY MOUTH THREE TIMES DAILY Joshua Debby CROME, MD  Active   ketorolac (ACULAR) 0.5 % ophthalmic solution [id number]  Place 1 drop into both eyes 3 (three) times daily. [provider]  Active   levocetirizine (XYZAL ) 5 MG tablet [id number]  TAKE 1 TABLET(5 MG) BY  MOUTH EVERY KARNA Joshua Debby CROME, MD  Active   Nebivolol  HCl 20 MG TABS [id number] Yes TAKE 1 TABLET(20 MG) BY MOUTH DAILY Joshua Debby CROME, MD  Active   Netarsudil-Latanoprost (ROCKLATAN) 0.02-0.005 % SOLN [id number]  Apply to eye. [provider]  Active            Med Note LAVADA, ROBIN   Mon May 06, 2021  1:56 PM) Instill 1 drop qhs OU  sodium bicarbonate 650 MG tablet [id number]  Take 650 mg by mouth 2 (two) times daily. [provider]  Active   terazosin  (HYTRIN ) 2 MG capsule [id number] Yes Take 1 capsule (2 mg total) by mouth at bedtime. Joshua Debby CROME, MD  Active     Discontinued 02/16/12 1058   VITAMIN D  PO [id number]  Take 1,000 mg by mouth daily. [provider]  Active   zolpidem  (AMBIEN ) 5 MG tablet [id number]  TAKE 1 TABLET(5 MG) BY MOUTH AT BEDTIME AS NEEDED FOR SLEEP Joshua Debby CROME, MD  Active               Assessment/Plan:   Hypertension: - Currently uncontrolled. BP goal <130/80 - Reviewed appropriate blood pressure monitoring technique and reviewed goal blood pressure. Recommended to check home blood pressure and heart rate  - Recommend to continue current regimen - Discussed recent lab results with increase in Scr from baseline. She plans to call her nephrologist office to see if she can be seen earlier to discuss with them regarding losartan .  - Options are limited for BP management given renal function     Follow Up Plan: await renal f/u  Darrelyn Drum, PharmD, BCPS, CPP Clinical Pharmacist Practitioner [facility] [phone]

## 2024-06-27 ENCOUNTER — Other Ambulatory Visit: Payer: Self-pay | Admitting: Internal Medicine

## 2024-06-27 DIAGNOSIS — I10 Essential (primary) hypertension: Secondary | ICD-10-CM

## 2024-06-30 ENCOUNTER — Other Ambulatory Visit: Payer: Self-pay | Admitting: Internal Medicine

## 2024-06-30 DIAGNOSIS — M1A39X0 Chronic gout due to renal impairment, multiple sites, without tophus (tophi): Secondary | ICD-10-CM

## 2024-06-30 DIAGNOSIS — E790 Hyperuricemia without signs of inflammatory arthritis and tophaceous disease: Secondary | ICD-10-CM

## 2024-07-04 NOTE — Telephone Encounter (Signed)
 Last OV 05/19/24 Next OV 02/13/25  Last refill 05/19/24 Qty #90/0

## 2024-07-12 ENCOUNTER — Other Ambulatory Visit: Payer: Self-pay | Admitting: Internal Medicine

## 2024-07-12 DIAGNOSIS — I10 Essential (primary) hypertension: Secondary | ICD-10-CM

## 2024-07-13 ENCOUNTER — Telehealth: Payer: Self-pay

## 2024-07-13 NOTE — Telephone Encounter (Signed)
 The patient's daughter dropped off FMLA paperwork today at 4:30 pm to be filled out. Paperwork placed in Dr. Joshua Box.

## 2024-07-14 NOTE — Telephone Encounter (Signed)
 FMLA paperwork has been completed for the patients daughter. The patients daughter has been made aware. Form has been placed back up front for pick up.

## 2024-07-14 NOTE — Telephone Encounter (Signed)
Paperwork has been received. 

## 2024-07-29 DIAGNOSIS — N184 Chronic kidney disease, stage 4 (severe): Secondary | ICD-10-CM | POA: Diagnosis not present

## 2024-07-29 DIAGNOSIS — N189 Chronic kidney disease, unspecified: Secondary | ICD-10-CM | POA: Diagnosis not present

## 2024-08-11 DIAGNOSIS — N1832 Chronic kidney disease, stage 3b: Secondary | ICD-10-CM | POA: Diagnosis not present

## 2024-08-11 DIAGNOSIS — I129 Hypertensive chronic kidney disease with stage 1 through stage 4 chronic kidney disease, or unspecified chronic kidney disease: Secondary | ICD-10-CM | POA: Diagnosis not present

## 2024-08-11 DIAGNOSIS — D649 Anemia, unspecified: Secondary | ICD-10-CM | POA: Diagnosis not present

## 2024-08-11 DIAGNOSIS — E1122 Type 2 diabetes mellitus with diabetic chronic kidney disease: Secondary | ICD-10-CM | POA: Diagnosis not present

## 2024-08-14 ENCOUNTER — Other Ambulatory Visit: Payer: Self-pay | Admitting: Internal Medicine

## 2024-08-14 DIAGNOSIS — N184 Chronic kidney disease, stage 4 (severe): Secondary | ICD-10-CM

## 2024-08-14 DIAGNOSIS — I10 Essential (primary) hypertension: Secondary | ICD-10-CM

## 2024-09-04 ENCOUNTER — Other Ambulatory Visit: Payer: Self-pay | Admitting: Internal Medicine

## 2024-09-04 DIAGNOSIS — J4540 Moderate persistent asthma, uncomplicated: Secondary | ICD-10-CM

## 2024-09-09 ENCOUNTER — Other Ambulatory Visit: Payer: Self-pay | Admitting: Internal Medicine

## 2024-09-09 DIAGNOSIS — F409 Phobic anxiety disorder, unspecified: Secondary | ICD-10-CM

## 2024-09-15 DIAGNOSIS — H401131 Primary open-angle glaucoma, bilateral, mild stage: Secondary | ICD-10-CM | POA: Diagnosis not present

## 2024-09-20 ENCOUNTER — Other Ambulatory Visit: Payer: Self-pay | Admitting: Internal Medicine

## 2024-09-20 DIAGNOSIS — M1A39X0 Chronic gout due to renal impairment, multiple sites, without tophus (tophi): Secondary | ICD-10-CM

## 2024-09-20 DIAGNOSIS — E790 Hyperuricemia without signs of inflammatory arthritis and tophaceous disease: Secondary | ICD-10-CM

## 2024-09-21 ENCOUNTER — Encounter: Payer: Self-pay | Admitting: *Deleted

## 2024-09-21 NOTE — Progress Notes (Signed)
 Sarah [name]                                          MRN: [id number]   09/21/2024   The VBCI Quality Team Specialist reviewed this patient medical record for the purposes of chart review for care gap closure. The following were reviewed: chart review for care gap closure-controlling blood pressure and kidney health evaluation for diabetes:eGFR  and uACR.    VBCI Quality Team

## 2024-10-15 ENCOUNTER — Other Ambulatory Visit: Payer: Self-pay | Admitting: Internal Medicine

## 2024-10-15 DIAGNOSIS — I10 Essential (primary) hypertension: Secondary | ICD-10-CM

## 2024-10-15 DIAGNOSIS — E785 Hyperlipidemia, unspecified: Secondary | ICD-10-CM

## 2024-11-03 ENCOUNTER — Encounter: Payer: Self-pay | Admitting: Dermatology

## 2024-11-03 ENCOUNTER — Ambulatory Visit: Admitting: Dermatology

## 2024-11-03 VITALS — BP 169/65 | HR 77

## 2024-11-03 DIAGNOSIS — D239 Other benign neoplasm of skin, unspecified: Secondary | ICD-10-CM | POA: Diagnosis not present

## 2024-11-03 DIAGNOSIS — L280 Lichen simplex chronicus: Secondary | ICD-10-CM

## 2024-11-03 DIAGNOSIS — B079 Viral wart, unspecified: Secondary | ICD-10-CM | POA: Diagnosis not present

## 2024-11-03 DIAGNOSIS — L739 Follicular disorder, unspecified: Secondary | ICD-10-CM

## 2024-11-03 MED ORDER — CLOBETASOL PROPIONATE 0.05 % EX OINT: TOPICAL_OINTMENT | CUTANEOUS | 3 refills | Status: AC

## 2024-11-03 NOTE — Patient Instructions (Addendum)
 VISIT SUMMARY:  Today, you were seen for a growth in your belly button, a wart on your hand, and dry, itchy skin on your foot. We discussed the treatment options and provided medications to help manage these conditions.  YOUR PLAN:  -VERRUCA:  A verruca is a wart caused by a virus. We treated the wart on your right hand by freezing it, which will kill the virus and the affected skin. You should apply Aquaphor daily to the treated area. The wart will crust and fall off in about two to three weeks.  -LICHEN SIMPLEX CHRONICUS:  Lichen simplex chronicus is a condition where the skin becomes thick and dark due to chronic itching and scratching. We prescribed clobetasol  ointment to help control the itching and allow the skin to lighten naturally over time. Apply the ointment for two weeks, then take a break for two weeks, and continue this cycle until the itching stops.  INSTRUCTIONS:   If you have any concerns or if the conditions do not improve, contact our office for further evaluation.   Cryotherapy Aftercare  Wash gently with soap and water everyday.   Apply Vaseline and Band-Aid daily until healed.       Important Information  Due to recent changes in healthcare laws, you may see results of your pathology and/or laboratory studies on MyChart before the doctors have had a chance to review them. We understand that in some cases there may be results that are confusing or concerning to you. Please understand that not all results are received at the same time and often the doctors may need to interpret multiple results in order to provide you with the best plan of care or course of treatment. Therefore, we ask that you please give us  2 business days to thoroughly review all your results before contacting the office for clarification. Should we see a critical lab result, you will be contacted sooner.   If You Need Anything After Your Visit  If you have any questions or concerns for your  doctor, please call our main line at [phone] If no one answers, please leave a voicemail as directed and we will return your call as soon as possible. Messages left after 4 pm will be answered the following business day.   You may also send us  a message via MyChart. We typically respond to MyChart messages within 1-2 business days.  For prescription refills, please ask your pharmacy to contact our office. Our fax number is [phone].  If you have an urgent issue when the clinic is closed that cannot wait until the next business day, you can page your doctor at the number below.    Please note that while we do our best to be available for urgent issues outside of office hours, we are not available 24/7.   If you have an urgent issue and are unable to reach us , you may choose to seek medical care at your doctor's office, retail clinic, urgent care center, or emergency room.  If you have a medical emergency, please immediately call 911 or go to the emergency department. In the event of inclement weather, please call our main line at [phone] for an update on the status of any delays or closures.  Dermatology Medication Tips: Please keep the boxes that topical medications come in in order to help keep track of the instructions about where and how to use these. Pharmacies typically print the medication instructions only on the boxes and not directly on the  medication tubes.   If your medication is too expensive, please contact our office at [phone] or send us  a message through MyChart.   We are unable to tell what your co-pay for medications will be in advance as this is different depending on your insurance coverage. However, we may be able to find a substitute medication at lower cost or fill out paperwork to get insurance to cover a needed medication.   If a prior authorization is required to get your medication covered by your insurance company, please allow us  1-2 business days  to complete this process.  Drug prices often vary depending on where the prescription is filled and some pharmacies may offer cheaper prices.  The website www.goodrx.com contains coupons for medications through different pharmacies. The prices here do not account for what the cost may be with help from insurance (it may be cheaper with your insurance), but the website can give you the price if you did not use any insurance.  - You can print the associated coupon and take it with your prescription to the pharmacy.  - You may also stop by our office during regular business hours and pick up a GoodRx coupon card.  - If you need your prescription sent electronically to a different pharmacy, notify our office through [facility] or by phone at [phone]

## 2024-11-03 NOTE — Progress Notes (Addendum)
" ° °  New Patient Visit  Patient (and/or pt guardian) consented to the use of AI-assisted tools for note generation.    Subjective  Sarah [name] is a 83 y.o. female who presents for the following: Nodule in umbilicus - Patient accompanied by daughter Sarah [name]   Located at the umbilicus that she would like to have examined.  Patient reports she is unsure of how long it has been there and was not aware of it until her PCP found it. Patient reports the area are not irritating or itchy.   Patient reports she has not previously been treated for these areas.  Patient denies Hx of bx. Patient denies family history of skin cancer(s).  The patient has spots, moles and lesions to be evaluated, some may be new or changing and the patient may have concern these could be cancer.  Patient states she also has darks spots on there left hand and and the left foot  The following portions of the chart were reviewed this encounter and updated as appropriate: medications, allergies, medical history  Review of Systems:  No other skin or systemic complaints except as noted in HPI or Assessment and Plan.  Objective  Well appearing patient in no apparent distress; mood and affect are within normal limits.  A focused examination was performed of the following areas: Umbilicus   Relevant exam findings are noted in the Assessment and Plan.     Assessment & Plan  Dilated Pore  Large firm nodule in umbilicus  Treatment plan: Pore extraction with forceps  (do not bill)  Verruca Palmar surface of the right hand, likely caused by a virus. The wart is scratched off slightly. Freezing is the treatment to kill the virus and affected skin, leading to crusting and eventual shedding. - Froze the verruca to eliminate the virus and affected skin. - Provided Aquaphor as a healing ointment to apply daily to the treated area. - Instructed that the wart will crust and fall off in approximately two to three  weeks.  Lichen simplex chronicus Itchy, dry skin on the foot, likely due to chronic scratching. The skin is getting darker and thicker from frequent itching. Clobetasol  ointment is prescribed to control the itching and allow natural lightening over time. - Prescribed clobetasol  ointment to be applied two weeks on, two weeks off until itching stops.      VERRUCA Right Thenar Eminence Destruction of lesion - Right Thenar Eminence Complexity: simple   Destruction method: cryotherapy   Informed consent: discussed and consent obtained   Timeout:  patient name, date of birth, surgical site, and procedure verified Cryotherapy cycles:  3  LICHEN SIMPLEX CHRONICUS    No follow-ups on file.  LILLETTE Lyle Cords, am acting as a neurosurgeon for [employer], DO .   Documentation: I have reviewed the above documentation for accuracy and completeness, and I agree with the above.  Delon Lenis, DO     "

## 2024-11-16 ENCOUNTER — Encounter: Payer: Self-pay | Admitting: Internal Medicine

## 2024-11-16 ENCOUNTER — Ambulatory Visit: Admitting: Internal Medicine

## 2024-11-16 VITALS — BP 144/68 | HR 69 | Temp 97.7°F | Resp 16 | Ht 62.0 in | Wt 145.6 lb

## 2024-11-16 DIAGNOSIS — D638 Anemia in other chronic diseases classified elsewhere: Secondary | ICD-10-CM | POA: Diagnosis not present

## 2024-11-16 DIAGNOSIS — R7303 Prediabetes: Secondary | ICD-10-CM

## 2024-11-16 DIAGNOSIS — Z0000 Encounter for general adult medical examination without abnormal findings: Secondary | ICD-10-CM | POA: Diagnosis not present

## 2024-11-16 DIAGNOSIS — Z0001 Encounter for general adult medical examination with abnormal findings: Secondary | ICD-10-CM

## 2024-11-16 DIAGNOSIS — E790 Hyperuricemia without signs of inflammatory arthritis and tophaceous disease: Secondary | ICD-10-CM

## 2024-11-16 DIAGNOSIS — J301 Allergic rhinitis due to pollen: Secondary | ICD-10-CM

## 2024-11-16 DIAGNOSIS — M1A39X0 Chronic gout due to renal impairment, multiple sites, without tophus (tophi): Secondary | ICD-10-CM

## 2024-11-16 DIAGNOSIS — I10 Essential (primary) hypertension: Secondary | ICD-10-CM

## 2024-11-16 DIAGNOSIS — N184 Chronic kidney disease, stage 4 (severe): Secondary | ICD-10-CM | POA: Diagnosis not present

## 2024-11-16 LAB — CBC WITH DIFFERENTIAL/PLATELET
Basophils Absolute: 0 K/uL (ref 0.0–0.1)
Basophils Relative: 0.5 % (ref 0.0–3.0)
Eosinophils Absolute: 1.5 K/uL — ABNORMAL HIGH (ref 0.0–0.7)
Eosinophils Relative: 21.8 % — ABNORMAL HIGH (ref 0.0–5.0)
HCT: 34.3 % — ABNORMAL LOW (ref 36.0–46.0)
Hemoglobin: 11.4 g/dL — ABNORMAL LOW (ref 12.0–15.0)
Lymphocytes Relative: 30.7 % (ref 12.0–46.0)
Lymphs Abs: 2.1 K/uL (ref 0.7–4.0)
MCHC: 33.2 g/dL (ref 30.0–36.0)
MCV: 93 fl (ref 78.0–100.0)
Monocytes Absolute: 0.4 K/uL (ref 0.1–1.0)
Monocytes Relative: 6 % (ref 3.0–12.0)
Neutro Abs: 2.8 K/uL (ref 1.4–7.7)
Neutrophils Relative %: 41 % — ABNORMAL LOW (ref 43.0–77.0)
Platelets: 240 K/uL (ref 150.0–400.0)
RBC: 3.69 Mil/uL — ABNORMAL LOW (ref 3.87–5.11)
RDW: 14.3 % (ref 11.5–15.5)
WBC: 6.9 K/uL (ref 4.0–10.5)

## 2024-11-16 LAB — BASIC METABOLIC PANEL WITH GFR
BUN: 36 mg/dL — ABNORMAL HIGH (ref 6–23)
CO2: 23 meq/L (ref 19–32)
Calcium: 10 mg/dL (ref 8.4–10.5)
Chloride: 107 meq/L (ref 96–112)
Creatinine, Ser: 1.67 mg/dL — ABNORMAL HIGH (ref 0.40–1.20)
GFR: 28.31 mL/min — ABNORMAL LOW (ref >60.00)
Glucose, Bld: 97 mg/dL (ref 70–99)
Potassium: 4.5 meq/L (ref 3.5–5.1)
Sodium: 140 meq/L (ref 135–145)

## 2024-11-16 LAB — MICROALBUMIN / CREATININE URINE RATIO
Creatinine,U: 44 mg/dL
Microalb Creat Ratio: 1041.2 mg/g — ABNORMAL HIGH (ref 0.0–30.0)
Microalb, Ur: 45.8 mg/dL — ABNORMAL HIGH (ref 0.7–1.9)

## 2024-11-16 LAB — URIC ACID: Uric Acid, Serum: 5.4 mg/dL (ref 2.4–7.0)

## 2024-11-16 MED ORDER — LEVOCETIRIZINE DIHYDROCHLORIDE 5 MG PO TABS: 5.0000 mg | ORAL_TABLET | Freq: Every evening | ORAL | 1 refills | Status: AC

## 2024-11-16 NOTE — Progress Notes (Unsigned)
 Subjective:  Patient ID: Sarah [name], female    DOB: [birth year]  Age: 83 y.o. MRN: [id number]  CC: Hypertension (6 month follow up ), Anemia, and Annual Exam   HPI Sarah [name] presents for a CPX and f/up ---    Discussed the use of AI scribe software for clinical note transcription with the patient, who gave verbal consent to proceed.  History of Present Illness Sarah [name] is an 83 year old female who presents with follow-up on her kidney function and sinus drainage.  The patient reports that another clinician told her her kidney function was stable. She denies symptoms such as abdominal pain, nausea, vomiting, chest pain, shortness of breath, dizziness, or lightheadedness.  She experiences persistent sinus drainage, describing it as 'it won't run out, it runs down in my throat.' She uses salt water mist for relief, but it is not effective. She wants medication to thin the mucus.  No symptoms of high blood pressure such as headache or blurred vision, although she mentions her vision is not good.  She recalls a past incident where she had a blood clot in her legs following back surgery, which was initially thought to be arthritis. This event was significant as it almost resulted in her death. No recent leg swelling is reported.     Outpatient Medications Prior to Visit  Medication Sig Dispense Refill   acetaminophen  (TYLENOL ) 500 MG tablet Take 500 mg by mouth every 6 (six) hours as needed.     allopurinol  (ZYLOPRIM ) 100 MG tablet TAKE 2 TABLETS BY MOUTH DAILY 180 tablet 1   atorvastatin  (LIPITOR) 10 MG tablet TAKE 1 TABLET(10 MG) BY MOUTH DAILY 90 tablet 0   brimonidine (ALPHAGAN) 0.2 % ophthalmic solution 1 drop 3 (three) times daily.     budesonide -formoterol  (SYMBICORT ) 160-4.5 MCG/ACT inhaler INHALE 2 PUFFS INTO THE LUNGS TWICE DAILY 30.6 g 0   clobetasol  cream (TEMOVATE ) 0.05 % Apply 1 Application topically 2 (two) times daily. 60 g 2   clobetasol  ointment (TEMOVATE )  0.05 % 1 Application twice daily for 2 weeks 30 g 3   diltiazem  (TIAZAC ) 300 MG 24 hr capsule TAKE 1 CAPSULE BY MOUTH DAILY BEFORE BREAKFAST 90 capsule 1   dorzolamide -timolol  (COSOPT ) 2-0.5 % ophthalmic solution PLACE 1 DROP INTO BOTH EYES IN THE MORNING, AT NOON, AND AT BEDTIME 10 mL 3   hydrALAZINE  (APRESOLINE ) 50 MG tablet TAKE 1 TABLET(50 MG) BY MOUTH THREE TIMES DAILY 270 tablet 0   ketorolac (ACULAR) 0.5 % ophthalmic solution Place 1 drop into both eyes 3 (three) times daily.     losartan  (COZAAR ) 50 MG tablet Take 50 mg by mouth daily.     Nebivolol  HCl 20 MG TABS TAKE 1 TABLET(20 MG) BY MOUTH DAILY 90 tablet 1   Netarsudil-Latanoprost (ROCKLATAN) 0.02-0.005 % SOLN Apply to eye.     sodium bicarbonate 650 MG tablet Take 650 mg by mouth 2 (two) times daily.     terazosin  (HYTRIN ) 2 MG capsule TAKE 1 CAPSULE(2 MG) BY MOUTH AT BEDTIME 90 capsule 0   VITAMIN D  PO Take 1,000 mg by mouth daily.     zolpidem  (AMBIEN ) 5 MG tablet TAKE 1 TABLET(5 MG) BY MOUTH AT BEDTIME AS NEEDED FOR SLEEP 90 tablet 0   levocetirizine (XYZAL ) 5 MG tablet TAKE 1 TABLET(5 MG) BY MOUTH EVERY EVENING 90 tablet 1   No facility-administered medications prior to visit.    ROS Review of Systems  Objective:  BP (!) 144/68 (  BP Location: Left Arm, Patient Position: Sitting, Cuff Size: Normal)   Pulse 69   Temp 97.7 F (36.5 C) (Oral)   Resp 16   Ht 5' 2 (1.575 m)   Wt 145 lb 9.6 oz (66 kg)   SpO2 98%   BMI 26.63 kg/m   BP Readings from Last 3 Encounters:  11/16/24 (!) 144/68  11/03/24 (!) 169/65  05/19/24 (!) 156/46    Wt Readings from Last 3 Encounters:  11/16/24 145 lb 9.6 oz (66 kg)  05/19/24 147 lb 3.2 oz (66.8 kg)  02/25/24 143 lb 12.8 oz (65.2 kg)    Physical Exam  Lab Results  Component Value Date   WBC 5.1 05/19/2024   HGB 10.4 (L) 05/19/2024   HCT 31.9 (L) 05/19/2024   PLT 194.0 05/19/2024   GLUCOSE 92 05/19/2024   CHOL 131 11/05/2023   TRIG 48.0 11/05/2023   HDL 62.70 11/05/2023    LDLCALC 58 11/05/2023   ALT 19 11/05/2023   AST 20 11/05/2023   NA 141 05/19/2024   K 4.6 05/19/2024   CL 109 05/19/2024   CREATININE 1.87 (H) 05/19/2024   BUN 40 (H) 05/19/2024   CO2 25 05/19/2024   TSH 1.94 11/05/2023   HGBA1C 5.8 06/18/2023    VAS US  LOWER EXTREMITY VENOUS (DVT) Result Date: 06/25/2023  Lower Venous DVT Study Patient Name:  Sarah [name]  Date of Exam:   06/25/2023 Medical Rec #: [id number]      Accession #:    [id number] Date of Birth: 1941-09-05      Patient Gender: F Patient Age:   14 years Exam Location:  [facility] Procedure:      VAS US  LOWER EXTREMITY VENOUS (DVT) Referring Phys: DEBBY MOLT --------------------------------------------------------------------------------  Indications: Pain.  Comparison Study: no prior Performing Technologist: Duwaine Hives RVS  Examination Guidelines: A complete evaluation includes B-mode imaging, spectral Doppler, color Doppler, and power Doppler as needed of all accessible portions of each vessel. Bilateral testing is considered an integral part of a complete examination. Limited examinations for reoccurring indications may be performed as noted. The reflux portion of the exam is performed with the patient in reverse Trendelenburg.  +-----+---------------+---------+-----------+----------+--------------+ RIGHTCompressibilityPhasicitySpontaneityPropertiesThrombus Aging +-----+---------------+---------+-----------+----------+--------------+ CFV  Full           Yes      Yes                                 +-----+---------------+---------+-----------+----------+--------------+   +---------+---------------+---------+-----------+----------+--------------+ LEFT     CompressibilityPhasicitySpontaneityPropertiesThrombus Aging +---------+---------------+---------+-----------+----------+--------------+ CFV      Full           Yes      Yes                                  +---------+---------------+---------+-----------+----------+--------------+ SFJ      Full                                                        +---------+---------------+---------+-----------+----------+--------------+ FV Prox  Full                                                        +---------+---------------+---------+-----------+----------+--------------+  FV Mid   Full                                                        +---------+---------------+---------+-----------+----------+--------------+ FV DistalFull                                                        +---------+---------------+---------+-----------+----------+--------------+ PFV      Full                                                        +---------+---------------+---------+-----------+----------+--------------+ POP      Full           Yes      Yes                                 +---------+---------------+---------+-----------+----------+--------------+ PTV      Full                                                        +---------+---------------+---------+-----------+----------+--------------+ PERO     Full                                                        +---------+---------------+---------+-----------+----------+--------------+     Summary: RIGHT: - No evidence of common femoral vein obstruction.  LEFT: - There is no evidence of deep vein thrombosis in the lower extremity.  - No cystic structure found in the popliteal fossa.  *See table(s) above for measurements and observations. Electronically signed by Lonni Blade MD on 06/25/2023 at 2:34:21 PM.    Final     Assessment & Plan:  Hyperuricemia -     Basic metabolic panel with GFR; Future -     Uric acid; Future  Essential hypertension -     Basic metabolic panel with GFR; Future -     CBC with Differential/Platelet; Future -     Microalbumin / creatinine urine ratio; Future -     Urinalysis, Routine w  reflex microscopic; Future  Anemia, chronic disease -     CBC with Differential/Platelet; Future  Prediabetes -     Basic metabolic panel with GFR; Future -     Hemoglobin A1c; Future -     HM Diabetes Foot Exam  Chronic gout due to renal impairment of multiple sites without tophus -     Basic metabolic panel with GFR; Future -     Uric acid; Future  Kidney disease, chronic, 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  Dispense: 90 tablet; Refill: 1     Follow-up: Return in about 6 months (around 05/17/2025).  Debby Molt, MD

## 2024-11-16 NOTE — Patient Instructions (Signed)

## 2024-11-17 ENCOUNTER — Other Ambulatory Visit: Payer: Self-pay | Admitting: Internal Medicine

## 2024-11-17 DIAGNOSIS — I10 Essential (primary) hypertension: Secondary | ICD-10-CM

## 2024-11-17 DIAGNOSIS — N184 Chronic kidney disease, stage 4 (severe): Secondary | ICD-10-CM

## 2024-11-17 LAB — URINALYSIS, ROUTINE W REFLEX MICROSCOPIC
Bilirubin Urine: NEGATIVE
Hgb urine dipstick: NEGATIVE
Ketones, ur: NEGATIVE
Leukocytes,Ua: NEGATIVE
Nitrite: NEGATIVE
RBC / HPF: NONE SEEN (ref 0–?)
Specific Gravity, Urine: 1.015 (ref 1.000–1.030)
Total Protein, Urine: 100 — AB
Urine Glucose: NEGATIVE
Urobilinogen, UA: 0.2 (ref 0.0–1.0)
pH: 7 (ref 5.0–8.0)

## 2024-11-17 LAB — HEMOGLOBIN A1C: Hgb A1c MFr Bld: 5.8 % (ref 4.6–6.5)

## 2024-11-18 ENCOUNTER — Ambulatory Visit: Payer: Self-pay | Admitting: Internal Medicine

## 2024-12-06 ENCOUNTER — Other Ambulatory Visit: Payer: Self-pay | Admitting: Internal Medicine

## 2024-12-06 DIAGNOSIS — J4540 Moderate persistent asthma, uncomplicated: Secondary | ICD-10-CM

## 2024-12-16 NOTE — Progress Notes (Signed)
 Sarah [name]                                          MRN: [id number]   12/16/2024   The [facility] Quality Team Specialist reviewed this patient medical record for the purposes of chart review for care gap closure. The following were reviewed: abstraction for care gap closure-kidney health evaluation for diabetes:eGFR  and uACR.    [facility] Quality Team

## 2024-12-20 ENCOUNTER — Other Ambulatory Visit: Payer: Self-pay | Admitting: Internal Medicine

## 2024-12-20 DIAGNOSIS — I10 Essential (primary) hypertension: Secondary | ICD-10-CM

## 2024-12-20 NOTE — Progress Notes (Signed)
 Sarah [name]                                          MRN: [id number]   12/20/2024   The [facility] Quality Team Specialist reviewed this patient medical record for the purposes of chart review for care gap closure. The following were reviewed: chart review for care gap closure-controlling blood pressure.    [facility] Quality Team

## 2024-12-29 ENCOUNTER — Telehealth: Payer: Self-pay | Admitting: Internal Medicine

## 2024-12-29 NOTE — Telephone Encounter (Signed)
 Patient dropped off document FMLA, to be filled out by provider. Patient requested to send it back via Call Patient to pick up within 7-days. Document is located in providers tray at front office.Please advise at [phone]

## 2024-12-30 NOTE — Telephone Encounter (Signed)
 Received and placed on Dr. Joshua desk.

## 2025-02-13 ENCOUNTER — Ambulatory Visit

## 2025-02-14 ENCOUNTER — Ambulatory Visit
